# Patient Record
Sex: Male | Born: 1985 | Race: Black or African American | Hispanic: No | Marital: Married | Smoking: Never smoker
Health system: Southern US, Community
[De-identification: ages and names within clinical notes are randomized; demographics above are authoritative.]

## PROBLEM LIST (undated history)

## (undated) DIAGNOSIS — D72819 Decreased white blood cell count, unspecified: Secondary | ICD-10-CM

## (undated) DIAGNOSIS — K8020 Calculus of gallbladder without cholecystitis without obstruction: Secondary | ICD-10-CM

## (undated) DIAGNOSIS — E559 Vitamin D deficiency, unspecified: Secondary | ICD-10-CM

## (undated) DIAGNOSIS — I10 Essential (primary) hypertension: Secondary | ICD-10-CM

## (undated) DIAGNOSIS — D649 Anemia, unspecified: Secondary | ICD-10-CM

## (undated) DIAGNOSIS — R55 Syncope and collapse: Secondary | ICD-10-CM

## (undated) DIAGNOSIS — K219 Gastro-esophageal reflux disease without esophagitis: Secondary | ICD-10-CM

## (undated) DIAGNOSIS — K921 Melena: Secondary | ICD-10-CM

## (undated) DIAGNOSIS — K8681 Exocrine pancreatic insufficiency: Secondary | ICD-10-CM

## (undated) DIAGNOSIS — K4090 Unilateral inguinal hernia, without obstruction or gangrene, not specified as recurrent: Secondary | ICD-10-CM

## (undated) DIAGNOSIS — U071 COVID-19: Secondary | ICD-10-CM

## (undated) DIAGNOSIS — R197 Diarrhea, unspecified: Secondary | ICD-10-CM

## (undated) DIAGNOSIS — G4730 Sleep apnea, unspecified: Secondary | ICD-10-CM

## (undated) HISTORY — DX: Diarrhea, unspecified: R19.7

## (undated) HISTORY — DX: Unilateral inguinal hernia, without obstruction or gangrene, not specified as recurrent: K40.90

## (undated) HISTORY — DX: Exocrine pancreatic insufficiency: K86.81

## (undated) HISTORY — DX: Decreased white blood cell count, unspecified: D72.819

## (undated) HISTORY — DX: Vitamin D deficiency, unspecified: E55.9

## (undated) HISTORY — DX: COVID-19: U07.1

## (undated) HISTORY — DX: Melena: K92.1

## (undated) HISTORY — DX: Essential (primary) hypertension: I10

## (undated) HISTORY — DX: Anemia, unspecified: D64.9

## (undated) HISTORY — DX: Gastro-esophageal reflux disease without esophagitis: K21.9

---

## 2009-10-29 ENCOUNTER — Emergency Department: Payer: Self-pay | Admitting: Emergency Medicine

## 2011-12-27 ENCOUNTER — Ambulatory Visit: Payer: Self-pay

## 2012-06-08 ENCOUNTER — Ambulatory Visit: Payer: Self-pay | Admitting: General Practice

## 2013-03-28 ENCOUNTER — Ambulatory Visit: Payer: Self-pay | Admitting: General Practice

## 2013-03-29 ENCOUNTER — Ambulatory Visit: Payer: Self-pay | Admitting: Bariatrics

## 2013-03-29 DIAGNOSIS — Z01810 Encounter for preprocedural cardiovascular examination: Secondary | ICD-10-CM

## 2013-03-29 LAB — PHOSPHORUS: Phosphorus: 3.4 mg/dL (ref 2.5–4.9)

## 2013-03-29 LAB — COMPREHENSIVE METABOLIC PANEL
Anion Gap: 3 — ABNORMAL LOW (ref 7–16)
BUN: 13 mg/dL (ref 7–18)
Bilirubin,Total: 0.7 mg/dL (ref 0.2–1.0)
Calcium, Total: 9.7 mg/dL (ref 8.5–10.1)
Chloride: 106 mmol/L (ref 98–107)
EGFR (African American): >60
Glucose: 86 mg/dL (ref 65–99)
Sodium: 139 mmol/L (ref 136–145)

## 2013-03-29 LAB — CBC WITH DIFFERENTIAL/PLATELET
Basophil %: 1 %
Eosinophil #: 0.1 10*3/uL (ref 0.0–0.7)
Eosinophil %: 1.5 %
Lymphocyte %: 46.5 %
MCH: 28.4 pg (ref 26.0–34.0)
MCHC: 34.9 g/dL (ref 32.0–36.0)
MCV: 82 fL (ref 80–100)
Monocyte #: 0.3 x10 3/mm (ref 0.2–1.0)
Platelet: 211 10*3/uL (ref 150–440)
RDW: 13.1 % (ref 11.5–14.5)
WBC: 5.6 10*3/uL (ref 3.8–10.6)

## 2013-03-29 LAB — TSH: Thyroid Stimulating Horm: 1.56 u[IU]/mL

## 2013-03-29 LAB — IRON AND TIBC
Iron Bind.Cap.(Total): 344 ug/dL (ref 250–450)
Iron Saturation: 25 %
Iron: 85 ug/dL (ref 65–175)

## 2013-03-29 LAB — FERRITIN: Ferritin (ARMC): 206 ng/mL (ref 8–388)

## 2013-03-29 LAB — MAGNESIUM: Magnesium: 1.8 mg/dL

## 2013-03-29 LAB — LIPASE, BLOOD: Lipase: 173 U/L (ref 73–393)

## 2013-04-13 ENCOUNTER — Ambulatory Visit: Payer: Self-pay | Admitting: General Practice

## 2013-05-13 ENCOUNTER — Ambulatory Visit: Payer: Self-pay | Admitting: General Practice

## 2013-06-13 ENCOUNTER — Ambulatory Visit: Payer: Self-pay | Admitting: General Practice

## 2013-07-23 ENCOUNTER — Ambulatory Visit: Payer: Self-pay | Admitting: Bariatrics

## 2013-07-30 ENCOUNTER — Inpatient Hospital Stay: Payer: Self-pay | Admitting: Bariatrics

## 2013-07-31 HISTORY — PX: BARIATRIC SURGERY: SHX1103

## 2013-07-31 LAB — BASIC METABOLIC PANEL
Anion Gap: 4 — ABNORMAL LOW (ref 7–16)
BUN: 9 mg/dL (ref 7–18)
CO2: 28 mmol/L (ref 21–32)
Calcium, Total: 9.8 mg/dL (ref 8.5–10.1)
Chloride: 101 mmol/L (ref 98–107)
Creatinine: 1.04 mg/dL (ref 0.60–1.30)
GLUCOSE: 101 mg/dL — AB (ref 65–99)
Osmolality: 265 (ref 275–301)
Potassium: 4.3 mmol/L (ref 3.5–5.1)
SODIUM: 133 mmol/L — AB (ref 136–145)

## 2013-07-31 LAB — CBC WITH DIFFERENTIAL/PLATELET
Basophil #: 0 10*3/uL (ref 0.0–0.1)
Basophil %: 0.1 %
Eosinophil #: 0 10*3/uL (ref 0.0–0.7)
Eosinophil %: 0 %
HCT: 43.2 % (ref 40.0–52.0)
HGB: 14.5 g/dL (ref 13.0–18.0)
Lymphocyte #: 1.1 10*3/uL (ref 1.0–3.6)
Lymphocyte %: 7.8 %
MCH: 27.9 pg (ref 26.0–34.0)
MCHC: 33.6 g/dL (ref 32.0–36.0)
MCV: 83 fL (ref 80–100)
MONOS PCT: 5.9 %
Monocyte #: 0.8 x10 3/mm (ref 0.2–1.0)
NEUTROS PCT: 86.2 %
Neutrophil #: 12.4 10*3/uL — ABNORMAL HIGH (ref 1.4–6.5)
PLATELETS: 227 10*3/uL (ref 150–440)
RBC: 5.21 10*6/uL (ref 4.40–5.90)
RDW: 13.2 % (ref 11.5–14.5)
WBC: 14.4 10*3/uL — ABNORMAL HIGH (ref 3.8–10.6)

## 2013-08-01 LAB — CBC WITH DIFFERENTIAL/PLATELET
Basophil: 1 %
COMMENT - H1-COM1: NORMAL
Eosinophil: 1 %
HCT: 40.8 % (ref 40.0–52.0)
HGB: 14 g/dL (ref 13.0–18.0)
Lymphocytes: 27 %
MCH: 28.4 pg (ref 26.0–34.0)
MCHC: 34.2 g/dL (ref 32.0–36.0)
MCV: 83 fL (ref 80–100)
MONOS PCT: 7 %
Platelet: 214 10*3/uL (ref 150–440)
RBC: 4.92 10*6/uL (ref 4.40–5.90)
RDW: 13.3 % (ref 11.5–14.5)
SEGMENTED NEUTROPHILS: 64 %
WBC: 9.8 10*3/uL (ref 3.8–10.6)

## 2013-08-01 LAB — PATHOLOGY REPORT

## 2013-08-19 ENCOUNTER — Ambulatory Visit: Payer: Self-pay | Admitting: Bariatrics

## 2013-09-11 ENCOUNTER — Ambulatory Visit: Payer: Self-pay | Admitting: Bariatrics

## 2013-10-18 ENCOUNTER — Other Ambulatory Visit: Payer: Self-pay | Admitting: Bariatrics

## 2014-01-30 ENCOUNTER — Other Ambulatory Visit: Payer: Self-pay | Admitting: Bariatrics

## 2014-01-30 LAB — CBC WITH DIFFERENTIAL/PLATELET
Basophil #: 0 10*3/uL (ref 0.0–0.1)
Basophil %: 1.2 %
EOS ABS: 0 10*3/uL (ref 0.0–0.7)
Eosinophil %: 1.2 %
HCT: 43.7 % (ref 40.0–52.0)
HGB: 13.7 g/dL (ref 13.0–18.0)
Lymphocyte #: 1.4 10*3/uL (ref 1.0–3.6)
Lymphocyte %: 49.8 %
MCH: 26.7 pg (ref 26.0–34.0)
MCHC: 31.3 g/dL — ABNORMAL LOW (ref 32.0–36.0)
MCV: 85 fL (ref 80–100)
MONOS PCT: 5.4 %
Monocyte #: 0.1 x10 3/mm — ABNORMAL LOW (ref 0.2–1.0)
Neutrophil #: 1.2 10*3/uL — ABNORMAL LOW (ref 1.4–6.5)
Neutrophil %: 42.4 %
Platelet: 171 10*3/uL (ref 150–440)
RBC: 5.11 10*6/uL (ref 4.40–5.90)
RDW: 14.6 % — ABNORMAL HIGH (ref 11.5–14.5)
WBC: 2.8 10*3/uL — ABNORMAL LOW (ref 3.8–10.6)

## 2014-01-30 LAB — PHOSPHORUS: Phosphorus: 2.9 mg/dL (ref 2.5–4.9)

## 2014-01-30 LAB — HEMOGLOBIN A1C: Hemoglobin A1C: 4.8 % (ref 4.2–6.3)

## 2014-01-30 LAB — COMPREHENSIVE METABOLIC PANEL
ALT: 26 U/L
Albumin: 3.4 g/dL (ref 3.4–5.0)
Alkaline Phosphatase: 82 U/L
Anion Gap: 7 (ref 7–16)
BUN: 10 mg/dL (ref 7–18)
Bilirubin,Total: 0.8 mg/dL (ref 0.2–1.0)
CALCIUM: 9 mg/dL (ref 8.5–10.1)
CHLORIDE: 106 mmol/L (ref 98–107)
CREATININE: 1.03 mg/dL (ref 0.60–1.30)
Co2: 27 mmol/L (ref 21–32)
EGFR (African American): >60
Glucose: 74 mg/dL (ref 65–99)
Osmolality: 277 (ref 275–301)
Potassium: 4.1 mmol/L (ref 3.5–5.1)
SGOT(AST): 27 U/L (ref 15–37)
Sodium: 140 mmol/L (ref 136–145)
Total Protein: 7.2 g/dL (ref 6.4–8.2)

## 2014-01-30 LAB — FOLATE: Folic Acid: 2.6 ng/mL — ABNORMAL LOW (ref 3.1–100.0)

## 2014-01-30 LAB — FERRITIN: FERRITIN (ARMC): 320 ng/mL (ref 8–388)

## 2014-01-30 LAB — IRON: Iron: 80 ug/dL (ref 65–175)

## 2014-01-30 LAB — MAGNESIUM: Magnesium: 2.1 mg/dL

## 2014-08-07 ENCOUNTER — Other Ambulatory Visit: Payer: Self-pay | Admitting: Surgical

## 2014-10-04 NOTE — Discharge Summary (Signed)
Dates of Admission and Diagnosis:  Date of Admission 30-Jul-2013   Date of Discharge 02-Aug-2013   Admitting Diagnosis morbid obesity   Final Diagnosis morbid obesity    Chief Complaint/History of Present Illness long standing morbid obesity with associated sleep apnea. failed multiple diets.   Allergies:  No Known Allergies:   PERTINENT RADIOLOGY STUDIES: XRay:    18-Feb-15 08:47, Upper GI  Upper GI   REASON FOR EXAM:    post procedure sleeve gastrectomy with duodenal/ileal   anastomosis. Rule out leak  COMMENTS:       PROCEDURE: FL  - FL UPPER GI  - Jul 31 2013  8:47AM     CLINICAL DATA:  Post gastric sleeve, assess for leakage    EXAM:  WATER SOLUBLE UPPER GI SERIES    TECHNIQUE:  Single-column upper GI series was performed using water soluble  contrast.  CONTRAST:  Gastroview orally    COMPARISON:  None.    FLUOROSCOPY TIME:  42 seconds    FINDINGS:  The thoracic esophagus distended well. The remnant gastric lumen  distended adequately. There was prompt drainage into the duodenum.  There was no evidence of leak. The patient voiced no discomfort.     IMPRESSION:  There is no evidence of an anastomotic leak status post gastric  sleeve procedure.  Electronically Signed    By: David  Martinique    On: 07/31/2013 08:52         Verified By: DAVID A. Martinique, M.D., MD  CT:    19-Feb-15 12:43, CT Abdomen With Contrast  CT Abdomen With Contrast   REASON FOR EXAM:    abdominal pain s/p sleeve gastrectomy with   duodenal/ileal anastomosis;    NOTE:  COMMENTS:       PROCEDURE: CT  - CT ABDOMEN STANDARD W  - Aug 01 2013 12:43PM     CLINICAL DATA:  Severe epigastric pain. Status post gastrectomy on  07/30/2013.    EXAM:  CT ABDOMEN WITH CONTRAST    TECHNIQUE:  Multidetector CT imaging of the abdomen was performed using the  standard protocol following bolus administration of intravenous  contrast.    CONTRAST:  125 mm of Isovue 370.    COMPARISON:  No  priors.    FINDINGS:  Lung Bases: Dependent subsegmental atelectasis in the lower lobes of  the lungs bilaterally. Trace left pleural effusion layering  dependently is simple in appearance.    Abdomen: Postoperative changes of sleeve gastrectomy and  duodenoileal bypass (SIPS procedure) are noted. There is some  haziness in the fat adjacent to the head of the pancreas and the  resected portion of the duodenum. There is a suture line in the  second portion of the duodenum, and the duodenalbulb and proximal  second portion of the duodenum have been resected. Body and tail of  the pancreas are normal in appearance. No pancreatic ductal  dilatation. No intra or extrahepatic biliary ductal dilatation. No  focal fluid collections are notedto suggest postoperative seroma,  hematoma or abscess at this time. There is a surgical drain in place  entering the left upper quadrant of the abdomen extending across the  midline and descending in the anterior aspect of the peritoneal  cavity. Theappearance of the liver, gallbladder, spleen, bilateral  adrenal glands and bilateral kidneys is unremarkable. Within the  visualized peritoneal cavity there is no significant volume of  ascites, no pneumoperitoneum and no pathologic distention of small  bowel.    Musculoskeletal: Several tiny locules  of gas are noted in the  anterior abdominal wall subcutaneous fat and musculature, iatrogenic  from recent surgery. Healing laparoscopy port sites are noted in the  epigastric region as well as a right and left sides of the abdomen.  There are no aggressive appearing lytic or blastic lesions noted in  the visualized portions of the skeleton.     IMPRESSION:  1. Postoperative changes of SIPS procedure are noted, with slight  haziness in the fat adjacent to the stomach, resected duodenum and  pancreatic head. This is likely resolving postoperative  inflammation/edema, however, given the findings adjacent to  the  pancreatic head, correlation with lipase levels is recommended to  exclude the possibility of pancreatitis.  2. No unexpected postoperative fluid collections to suggest  significant seroma, hematoma or abscess formation at this time.  3. Small amount of dependent subsegmental atelectasis in the lower  lobes of the lungs bilaterallywith trace left pleural effusion.  4. Additional incidental findings, as above.      Electronically Signed    By: Vinnie Langton M.D.    On: 08/01/2013 14:19         Verified By: Etheleen Mayhew, M.D.,   Pertinent Past History:  Pertinent Past History long standing obesity with recently diagnosed sleep apnea.   Hospital Course:  Hospital Course patient underwent sleeve gastrectomy with duodenal switch on day of admission. He tolerated well but had some prolonged incisional and abdominal pain. His initial postoperative contrast study showed no leak at anastomotic site. This was confirmed on followup ct scan. His pain improved by his 3rd postoperative day and he was d/c to home.   Condition on Discharge Stable   Code Status:  Code Status Full Code   DISCHARGE INSTRUCTIONS HOME MEDS:  Medication Reconciliation: Patient's Home Medications at Discharge:     Medication Instructions  vitamin d gummy  1 tab(s) orally once a day   acetaminophen-hydrocodone  10 milliliter(s) orally every 4 hours, As needed, pain    PRESCRIPTIONS: PRINTED AND GIVEN TO PATIENT/FAMILY   Physician's Instructions:  Home Health? No   Treatments None   Dressing Care Replace dressing as necessary.  May shower.   Home Oxygen? No   Diet bariatric liquids as per protocol   Activity Limitations No heavy lifting   Return to Work 2 weeks   Time frame for Follow Up Appointment 1-2 weeks   Electronic Signatures: Ladora Daniel (MD)  (Signed 08-Apr-15 11:24)  Authored: ADMISSION DATE AND DIAGNOSIS, CHIEF COMPLAINT/HPI, Allergies, PERTINENT RADIOLOGY STUDIES,  PERTINENT PAST HISTORY, HOSPITAL COURSE, DISCHARGE INSTRUCTIONS HOME MEDS, PATIENT INSTRUCTIONS   Last Updated: 08-Apr-15 11:24 by Ladora Daniel (MD)

## 2014-10-04 NOTE — Op Note (Signed)
PATIENT NAME:  Thomas Shaw, Thomas Shaw MR#:  [id number] DATE OF BIRTH:  04/03/1986  DATE OF PROCEDURE:  07/30/2013  PROCEDURE PERFORMED: Laparoscopic sleeve gastrectomy with a duodenal switch,  duodenal ileal anastomosis, distal stomach stricturoplasty with intraoperative endoscopy.  PREOPERATIVE DIAGNOSIS: Long-standing morbid obesity with multiple attempts at dieting.  POSTOPERATIVE DIAGNOSIS: Long-standing morbid obesity with multiple attempts at dieting with narrowing of the distal aspect of the sleeve, prompting a stricturoplasty to prevent potential for outlet obstruction of the stomach.   PROCEDURE: The patient was brought to the Operating Room and placed in supine position. General anesthesia was obtained with orotracheal intubation. A Foley catheter inserted sterilely. TED hose and Thromboguards were applied and a foot board applied at the end of the operative bed. The abdomen and chest were sterilely prepped and draped. A 5 mm Optiview trocar introduced under direct visualization in the left upper quadrant of the abdomen. Four additional trocars were introduced across the upper abdomen under direct visualization.   The terminal ileum was identified with the ileocecal valve. The bowel was then followed proximally 300 cm, at which point the ileum was secured to the gastrocolic ligament. The patient then had a Nathanson liver retractor introduced through a subxiphoid defect, and left lobe of the liver was elevated. There was no overt evidence of a hiatal hernia appreciated. The patient had mobilization of the fat pad in the region of the upper stomach, adjacent to the angle of Hiss. The fat pad being mobilized from the undersurface of the left hemidiaphragm by use of the Harmonic scalpel, the upper fundus also freed from the undersurface of the left hemidiaphragm. No evidence of a hiatal hernia appreciated.   Attention was directed at this time to the distal stomach, where arcade vessels were divided  along the distal greater curvature of the stomach. This included mobilization of the peritoneal attachments of the distal stomach to the underlying pancreas. At this point, a 36 [place] ViSiGi device was deployed in the region of the antrum, and a series of GI staplers was used to create a medially based gastric tube effect. The first two firings were placed in a relative transverse direction in an effort to avoid narrowing in the region of the incisura. This was then followed by a vertical line of staples, brought out just lateral to the angle of Hiss, leaving a small dog ear of stomach proximally.   Prior to the last firing of GI stapler, the residual vascular pedicles were divided along the greater curvature of the stomach, completing separation of the gastrosplenic and gastrocolic ligament. Next, the vascular pedicles associated with the pylorus and proximal duodenum were taken down by use of a Harmonic scalpel, initially along the distal lesser curvature of the stomach and the inner curve of the duodenum. Next, portion of the peritoneal attachments of the lateral aspect of the proximal duodenum divided by use of the Harmonic scalpel. Blunt dissection was used to create a clear window across the posterior aspect of the duodenum at a point approximately 2.5 cm inferior to the pylorus.   A blue load GIA stapler was used to transect the duodenum at this point. Residual small vascular pedicles along the lateral aspect of the proximal duodenum divided, freeing the pylorus, allowing it to be brought more toward the midline. At this point, a duodenal ileal anastomosis created. This was accomplished with a seromuscular, running 2-0 Polysorb suture along the distal aspect of the duodenal staple line. This was then followed by an enterotomy on the antimesenteric  border of the ileum and opposing portion of the anterior aspect of the duodenum. A full-thickness, running 3-0 Polysorb suture was used to create full  circumferential anastomotic closure. This was then reinforced with an additional running 2-0 Polysorb suture.   The patient then had occlusion of the efferent and afferent limbs of the ileum and insufflation of the gastric tube effect performed with the ViSiGi device. There was no evidence of air leak noted in the area of the anastomosis. On inspection of the gastric tube effect, there was questionable mild narrowing leading into the region of the incisura from the antrum. The ViSiGi device was withdrawn, and intraoperative endoscopy performed. While there was significant narrowing, there was mild a relative decrease in the lumen effect immediately distal to the incisura. It was felt that, over the long term, this may represent a focus of decreased clearance of the gastric tube, and it was felt that a dilation of this would be an appropriate preventative for dilation of this area in the future.   With this in mind, an enterotomy was made in the anterior aspect of the sleeve, immediately inferior to the incisura. This enterotomy was made over a distance of approximately 2.5 cm. The patient then had closure of the defect in a transverse direction, using a running 2-0 Surgidac suture. At the closure, the endoscope was then used for insufflation, and a saline bath performed in this area. No air leak identified. With the endoscope, there was noted to be increased patency of this portion of the gastric sleeve. At this time, no additional intervention was felt to be necessary.   JP drain was introduced through a left lateral trocar space. The wound was treated with 4-0 Monocryl to the dermis, followed by staples superiorly. Sterile dressings applied, and the drain  secured with a sterile dressing. The patient at this time was allowed to recover from anesthesia, having tolerated the procedure well. There was minimal blood loss.    ____________________________ Venia Carbon Duke Salvia, MD mat:cg D: 07/31/2013 00:04:08  ET T: 07/31/2013 00:43:24 ET JOB#: [id number]  cc: Legrand Como A. Duke Salvia, MD, <Dictator> Ladora Daniel MD ELECTRONICALLY SIGNED 08/01/2013 6:21

## 2014-10-08 ENCOUNTER — Other Ambulatory Visit: Payer: Self-pay | Admitting: Gastroenterology

## 2014-10-09 ENCOUNTER — Other Ambulatory Visit: Payer: Self-pay | Admitting: Gastroenterology

## 2014-10-09 DIAGNOSIS — R1084 Generalized abdominal pain: Secondary | ICD-10-CM

## 2014-10-13 ENCOUNTER — Ambulatory Visit
Admission: RE | Admit: 2014-10-13 | Discharge: 2014-10-13 | Disposition: A | Discharge status: Home / Self Care | Payer: 59 | Source: Ambulatory Visit | Attending: Gastroenterology | Admitting: Gastroenterology

## 2014-10-13 ENCOUNTER — Other Ambulatory Visit: Payer: Self-pay | Admitting: *Deleted

## 2014-10-13 DIAGNOSIS — R1084 Generalized abdominal pain: Secondary | ICD-10-CM | POA: Diagnosis present

## 2014-10-13 DIAGNOSIS — R188 Other ascites: Secondary | ICD-10-CM | POA: Diagnosis not present

## 2014-10-13 DIAGNOSIS — K828 Other specified diseases of gallbladder: Secondary | ICD-10-CM | POA: Diagnosis not present

## 2014-10-13 DIAGNOSIS — R52 Pain, unspecified: Secondary | ICD-10-CM

## 2014-10-15 DIAGNOSIS — K8681 Exocrine pancreatic insufficiency: Secondary | ICD-10-CM

## 2014-10-15 HISTORY — DX: Exocrine pancreatic insufficiency: K86.81

## 2015-03-02 ENCOUNTER — Telehealth: Payer: Self-pay | Admitting: Gastroenterology

## 2015-03-02 NOTE — Telephone Encounter (Signed)
Left voice message for patient to call and schedule for abdominal distension (gaseous) with Dr. Allen Norris

## 2015-04-15 ENCOUNTER — Other Ambulatory Visit: Payer: Self-pay

## 2015-04-15 DIAGNOSIS — R1013 Epigastric pain: Secondary | ICD-10-CM | POA: Insufficient documentation

## 2015-04-16 ENCOUNTER — Ambulatory Visit: Payer: Self-pay | Admitting: Gastroenterology

## 2015-06-25 DIAGNOSIS — K8681 Exocrine pancreatic insufficiency: Secondary | ICD-10-CM | POA: Diagnosis not present

## 2015-12-08 ENCOUNTER — Emergency Department
Admission: EM | Admit: 2015-12-08 | Discharge: 2015-12-08 | Disposition: A | Discharge status: Home / Self Care | Payer: 59 | Attending: Emergency Medicine | Admitting: Emergency Medicine

## 2015-12-08 DIAGNOSIS — Z91013 Allergy to seafood: Secondary | ICD-10-CM | POA: Insufficient documentation

## 2015-12-08 DIAGNOSIS — T7840XA Allergy, unspecified, initial encounter: Secondary | ICD-10-CM | POA: Insufficient documentation

## 2015-12-08 DIAGNOSIS — Z79899 Other long term (current) drug therapy: Secondary | ICD-10-CM | POA: Diagnosis not present

## 2015-12-08 DIAGNOSIS — L539 Erythematous condition, unspecified: Secondary | ICD-10-CM | POA: Diagnosis present

## 2015-12-08 DIAGNOSIS — T781XXA Other adverse food reactions, not elsewhere classified, initial encounter: Secondary | ICD-10-CM | POA: Diagnosis not present

## 2015-12-08 MED ORDER — EPINEPHRINE 0.3 MG/0.3ML IJ SOAJ: 0.3000 mg | Freq: Once | INTRAMUSCULAR | Status: AC

## 2015-12-08 MED ORDER — DIPHENHYDRAMINE HCL 50 MG/ML IJ SOLN
50.0000 mg | Freq: Once | INTRAMUSCULAR | Status: AC
  Administered 2015-12-08: 50 mg via INTRAVENOUS
  Filled 2015-12-08: qty 1

## 2015-12-08 MED ORDER — FAMOTIDINE IN NACL 20-0.9 MG/50ML-% IV SOLN
20.0000 mg | Freq: Once | INTRAVENOUS | Status: AC
  Administered 2015-12-08: 20 mg via INTRAVENOUS
  Filled 2015-12-08: qty 50

## 2015-12-08 MED ORDER — PREDNISONE 20 MG PO TABS: 40.0000 mg | ORAL_TABLET | Freq: Every day | ORAL | Status: DC

## 2015-12-08 MED ORDER — DIPHENHYDRAMINE HCL 25 MG PO CAPS: 50.0000 mg | ORAL_CAPSULE | Freq: Four times a day (QID) | ORAL | Status: DC | PRN

## 2015-12-08 MED ORDER — METHYLPREDNISOLONE SODIUM SUCC 125 MG IJ SOLR
125.0000 mg | Freq: Once | INTRAMUSCULAR | Status: AC
Start: 2015-12-08 — End: 2015-12-08
  Administered 2015-12-08: 125 mg via INTRAVENOUS
  Filled 2015-12-08: qty 2

## 2015-12-08 MED ORDER — SODIUM CHLORIDE 0.9 % IV BOLUS (SEPSIS)
1000.0000 mL | Freq: Once | INTRAVENOUS | Status: AC
  Administered 2015-12-08: 1000 mL via INTRAVENOUS

## 2015-12-08 NOTE — ED Provider Notes (Signed)
[facility] Emergency Department Provider Note  ____________________________________________  Time seen: 4:30 AM  I have reviewed the triage vital signs and the nursing notes.   HISTORY  Chief Complaint Allergic Reaction    HPI Thomas Shaw is a 30 y.o. male who woke up a few hours ago noticing diffuse itching all over his body with an emerging red rash. Also complains of some swelling of his lower lip. No throat swelling or tongue swelling. No shortness of breath. Unsure of the cause. Only known allergy to anything history. He ate some fish last night it was prepared at home, he reports it was tilapia.  No chest pain or shortness of breath. No wheezing. No vomiting or dizziness or syncope.     No past medical history on file.   Patient Active Problem List   Diagnosis Date Noted  . Abdominal pain, epigastric 04/15/2015     No past surgical history on file. Gastric bypass  Current Outpatient Rx  Name  Route  Sig  Dispense  Refill  . Cholecalciferol (D 1000) 1000 UNITS CHEW   Oral   Chew 1 tablet by mouth daily.          Marland Kitchen CREON 36000 units CPEP capsule   Oral   Take 4 capsules by mouth 4 (four) times daily.      11     Dispense as written.   . Multiple Vitamin (MULTI-VITAMINS) TABS   Oral   Take 1 tablet by mouth daily.          . simethicone (MYLICON) 80 MG chewable tablet   Oral   Chew 160 mg by mouth as needed.          . diphenhydrAMINE (BENADRYL) 25 mg capsule   Oral   Take 2 capsules (50 mg total) by mouth every 6 (six) hours as needed.   60 capsule   0   . EPINEPHrine 0.3 mg/0.3 mL IJ SOAJ injection   Intramuscular   Inject 0.3 mLs (0.3 mg total) into the muscle once. Follow package instructions as needed for severe allergy or anaphylactic reaction.   1 Device   2   . predniSONE (DELTASONE) 20 MG tablet   Oral   Take 2 tablets (40 mg total) by mouth daily.   8 tablet   0       Allergies Shellfish-derived products Shrimp  No family history on file.  Social History Social History  Substance Use Topics  . Smoking status: Not on file  . Smokeless tobacco: Not on file  . Alcohol Use: Not on file  No tobacco or alcohol use  Review of Systems  Constitutional:   No fever or chills.  ENT:   No sore throat. No rhinorrhea. Cardiovascular:   No chest pain. Respiratory:   No dyspnea or cough. Gastrointestinal:   Negative for abdominal pain, vomiting and diarrhea.  10-point ROS otherwise negative.  ____________________________________________   PHYSICAL EXAM:  VITAL SIGNS: ED Triage Vitals  Enc Vitals Group     BP 12/08/15 0419 135/85 mmHg     Pulse Rate 12/08/15 0419 75     Resp 12/08/15 0419 18     Temp 12/08/15 0419 97.9 F (36.6 C)     Temp Source 12/08/15 0419 Oral     SpO2 12/08/15 0419 100 %     Weight 12/08/15 0419 180 lb (81.647 kg)     Height 12/08/15 0419 5\' 11"  (1.803 m)     Head Cir --  Peak Flow --      Pain Score --      Pain Loc --      Pain Edu? --      Excl. in [place]? --     Vital signs reviewed, nursing assessments reviewed.   Constitutional:   Alert and oriented. Well appearing and in no distress. Eyes:   No scleral icterus. No conjunctival pallor. PERRL. EOMI.  No nystagmus. ENT   Head:   Normocephalic and atraumatic.Swelling of the lower lip with soft watery edema.    Nose:   No congestion/rhinnorhea. No septal hematoma   Mouth/Throat:   MMM, no pharyngeal erythema. No peritonsillar mass. No tongue elevation or edema of the floor of mouth.   Neck:   No stridor. No SubQ emphysema. No meningismus. Hematological/Lymphatic/Immunilogical:   No cervical lymphadenopathy. Cardiovascular:   RRR. Symmetric bilateral radial and DP pulses.  No murmurs.  Respiratory:   Normal respiratory effort without tachypnea nor retractions. Breath sounds are clear and equal bilaterally. No  wheezes/rales/rhonchi. Gastrointestinal:   Soft and nontender. Non distended. There is no CVA tenderness.  No rebound, rigidity, or guarding. Genitourinary:   deferred Musculoskeletal:   Nontender with normal range of motion in all extremities. No joint effusions.  No lower extremity tenderness.  No edema. Neurologic:   Normal speech and language.  CN 2-10 normal. Motor grossly intact. No gross focal neurologic deficits are appreciated.  Skin:    Confluent erythematous rash on extremities and the back and the neck. Most prominent in flexor areas. Not classic for urticaria. No patterned rash, no petechia purpura bullae.  ____________________________________________    LABS (pertinent positives/negatives) (all labs ordered are listed, but only abnormal results are displayed) Labs Reviewed - No data to display ____________________________________________   EKG    ____________________________________________    RADIOLOGY    ____________________________________________   PROCEDURES   ____________________________________________   INITIAL IMPRESSION / ASSESSMENT AND PLAN / ED COURSE  Pertinent labs & imaging results that were available during my care of the patient were reviewed by me and considered in my medical decision making (see chart for details).  Patient presents with skin rash and lip swelling consistent with allergic reaction. Since the only thing he knows he is allergic to out of everything is shrimp, I think it is most likely that the fish he ate last night was contaminated with shrimp from the store. A low dose exposure may have caused a delayed reaction like this. No anaphylaxis. Not consistent with Stevens-Johnson's or TEN.  I highly doubt drug rash or dress. No evidence of infection. Patient given antihistamines and steroids and at recheck at 7:00 AM he feels much better. Rash has resolved. I'll continue steroids and antihistamine at home, prescribed EpiPen as a  precautionary measure. Follow-up with primary care.     ____________________________________________   FINAL CLINICAL IMPRESSION(S) / ED DIAGNOSES  Final diagnoses:  Allergic reaction, initial encounter       Portions of this note were generated with dragon dictation software. Dictation errors may occur despite best attempts at proofreading.   Carrie Mew, MD 12/08/15 [phone]

## 2015-12-08 NOTE — ED Notes (Signed)
Pt in with co itching all over and lower lip swelling, pt unsure of cause.

## 2015-12-08 NOTE — Discharge Instructions (Signed)

## 2016-02-12 DIAGNOSIS — Z0000 Encounter for general adult medical examination without abnormal findings: Secondary | ICD-10-CM | POA: Diagnosis not present

## 2016-02-12 DIAGNOSIS — T783XXS Angioneurotic edema, sequela: Secondary | ICD-10-CM | POA: Diagnosis not present

## 2016-02-22 DIAGNOSIS — D649 Anemia, unspecified: Secondary | ICD-10-CM

## 2016-02-22 DIAGNOSIS — R202 Paresthesia of skin: Secondary | ICD-10-CM | POA: Insufficient documentation

## 2016-02-22 HISTORY — DX: Anemia, unspecified: D64.9

## 2016-02-25 DIAGNOSIS — K8681 Exocrine pancreatic insufficiency: Secondary | ICD-10-CM | POA: Diagnosis not present

## 2016-02-25 DIAGNOSIS — D649 Anemia, unspecified: Secondary | ICD-10-CM | POA: Diagnosis not present

## 2016-02-25 DIAGNOSIS — R202 Paresthesia of skin: Secondary | ICD-10-CM | POA: Diagnosis not present

## 2016-03-11 DIAGNOSIS — K8681 Exocrine pancreatic insufficiency: Secondary | ICD-10-CM | POA: Diagnosis not present

## 2016-03-11 DIAGNOSIS — R202 Paresthesia of skin: Secondary | ICD-10-CM | POA: Diagnosis not present

## 2016-03-11 DIAGNOSIS — D649 Anemia, unspecified: Secondary | ICD-10-CM | POA: Diagnosis not present

## 2016-04-14 DIAGNOSIS — T783XXA Angioneurotic edema, initial encounter: Secondary | ICD-10-CM | POA: Diagnosis not present

## 2016-04-14 DIAGNOSIS — J3089 Other allergic rhinitis: Secondary | ICD-10-CM | POA: Diagnosis not present

## 2016-04-14 DIAGNOSIS — T781XXA Other adverse food reactions, not elsewhere classified, initial encounter: Secondary | ICD-10-CM | POA: Diagnosis not present

## 2016-04-14 DIAGNOSIS — J309 Allergic rhinitis, unspecified: Secondary | ICD-10-CM | POA: Diagnosis not present

## 2016-07-08 ENCOUNTER — Ambulatory Visit: Payer: Self-pay | Admitting: Physician Assistant

## 2016-07-08 ENCOUNTER — Encounter: Payer: Self-pay | Admitting: Physician Assistant

## 2016-07-08 VITALS — BP 110/80 | HR 90 | Temp 98.8°F

## 2016-07-08 DIAGNOSIS — A084 Viral intestinal infection, unspecified: Secondary | ICD-10-CM

## 2016-07-08 NOTE — Progress Notes (Signed)
S:  Pt c/o loose stools and sore throat, sx for 1 day, no fever/chills, cough or congestion, no abd pain except for cramping with diarrhea; denies cp/sob, denies camping, bad food, recent antibiotics, or exposure to bad water Remainder ros neg  O:  Vitals wnl, nad, ENT wnl, neck supple no lymph, lungs c t a, cv rrr, abd soft nontender bs increased lower quads b/l, neuro intact  A:  Viral gastroenteritis  P:  Reassurance, fluids, brat diet, immodium ad for diarrhea if needed,  return if not better in 3 days, return earlier if worsening

## 2016-11-16 DIAGNOSIS — K8681 Exocrine pancreatic insufficiency: Secondary | ICD-10-CM | POA: Diagnosis not present

## 2016-12-30 ENCOUNTER — Ambulatory Visit: Payer: Self-pay | Admitting: Registered Nurse

## 2016-12-30 VITALS — BP 118/70 | HR 73 | Temp 98.5°F

## 2016-12-30 DIAGNOSIS — I10 Essential (primary) hypertension: Secondary | ICD-10-CM

## 2016-12-30 DIAGNOSIS — E559 Vitamin D deficiency, unspecified: Secondary | ICD-10-CM

## 2016-12-30 DIAGNOSIS — K219 Gastro-esophageal reflux disease without esophagitis: Secondary | ICD-10-CM

## 2016-12-30 DIAGNOSIS — J019 Acute sinusitis, unspecified: Secondary | ICD-10-CM

## 2016-12-30 DIAGNOSIS — G4733 Obstructive sleep apnea (adult) (pediatric): Secondary | ICD-10-CM | POA: Insufficient documentation

## 2016-12-30 HISTORY — DX: Essential (primary) hypertension: I10

## 2016-12-30 HISTORY — DX: Gastro-esophageal reflux disease without esophagitis: K21.9

## 2016-12-30 HISTORY — DX: Vitamin D deficiency, unspecified: E55.9

## 2016-12-30 MED ORDER — FLUTICASONE PROPIONATE 50 MCG/ACT NA SUSP: 1.0000 | Freq: Two times a day (BID) | NASAL | 1 refills | Status: DC | PRN

## 2016-12-30 MED ORDER — CETIRIZINE HCL 10 MG PO TABS: 10.0000 mg | ORAL_TABLET | Freq: Every day | ORAL | 0 refills | Status: DC

## 2016-12-30 MED ORDER — SALINE SPRAY 0.65 % NA SOLN: 2.0000 | NASAL | 0 refills | Status: DC

## 2016-12-30 MED ORDER — AMOXICILLIN-POT CLAVULANATE 875-125 MG PO TABS: 1.0000 | ORAL_TABLET | Freq: Two times a day (BID) | ORAL | 0 refills | Status: DC

## 2016-12-30 NOTE — Patient Instructions (Signed)
Sinusitis, Adult Sinusitis is soreness and inflammation of your sinuses. Sinuses are hollow spaces in the bones around your face. Your sinuses are located:  Around your eyes.  In the middle of your forehead.  Behind your nose.  In your cheekbones.  Your sinuses and nasal passages are lined with a stringy fluid (mucus). Mucus normally drains out of your sinuses. When your nasal tissues become inflamed or swollen, the mucus can become trapped or blocked so air cannot flow through your sinuses. This allows bacteria, viruses, and funguses to grow, which leads to infection. Sinusitis can develop quickly and last for 7?10 days (acute) or for more than 12 weeks (chronic). Sinusitis often develops after a cold. What are the causes? This condition is caused by anything that creates swelling in the sinuses or stops mucus from draining, including:  Allergies.  Asthma.  Bacterial or viral infection.  Abnormally shaped bones between the nasal passages.  Nasal growths that contain mucus (nasal polyps).  Narrow sinus openings.  Pollutants, such as chemicals or irritants in the air.  A foreign object stuck in the nose.  A fungal infection. This is rare.  What increases the risk? The following factors may make you more likely to develop this condition:  Having allergies or asthma.  Having had a recent cold or respiratory tract infection.  Having structural deformities or blockages in your nose or sinuses.  Having a weak immune system.  Doing a lot of swimming or diving.  Overusing nasal sprays.  Smoking.  What are the signs or symptoms? The main symptoms of this condition are pain and a feeling of pressure around the affected sinuses. Other symptoms include:  Upper toothache.  Earache.  Headache.  Bad breath.  Decreased sense of smell and taste.  A cough that may get worse at night.  Fatigue.  Fever.  Thick drainage from your nose. The drainage is often green and  it may contain pus (purulent).  Stuffy nose or congestion.  Postnasal drip. This is when extra mucus collects in the throat or back of the nose.  Swelling and warmth over the affected sinuses.  Sore throat.  Sensitivity to light.  How is this diagnosed? This condition is diagnosed based on symptoms, a medical history, and a physical exam. To find out if your condition is acute or chronic, your health care provider may:  Look in your nose for signs of nasal polyps.  Tap over the affected sinus to check for signs of infection.  View the inside of your sinuses using an imaging device that has a light attached (endoscope).  If your health care provider suspects that you have chronic sinusitis, you may also:  Be tested for allergies.  Have a sample of mucus taken from your nose (nasal culture) and checked for bacteria.  Have a mucus sample examined to see if your sinusitis is related to an allergy.  If your sinusitis does not respond to treatment and it lasts longer than 8 weeks, you may have an MRI or CT scan to check your sinuses. These scans also help to determine how severe your infection is. In rare cases, a bone biopsy may be done to rule out more serious types of fungal sinus disease. How is this treated? Treatment for sinusitis depends on the cause and whether your condition is chronic or acute. If a virus is causing your sinusitis, your symptoms will go away on their own within 10 days. You may be given medicines to relieve your symptoms,   including:  Topical nasal decongestants. They shrink swollen nasal passages and let mucus drain from your sinuses.  Antihistamines. These drugs block inflammation that is triggered by allergies. This can help to ease swelling in your nose and sinuses.  Topical nasal corticosteroids. These are nasal sprays that ease inflammation and swelling in your nose and sinuses.  Nasal saline washes. These rinses can help to get rid of thick mucus in  your nose.  If your condition is caused by bacteria, you will be given an antibiotic medicine. If your condition is caused by a fungus, you will be given an antifungal medicine. Surgery may be needed to correct underlying conditions, such as narrow nasal passages. Surgery may also be needed to remove polyps. Follow these instructions at home: Medicines  Take, use, or apply over-the-counter and prescription medicines only as told by your health care provider. These may include nasal sprays.  If you were prescribed an antibiotic medicine, take it as told by your health care provider. Do not stop taking the antibiotic even if you start to feel better. Hydrate and Humidify  Drink enough water to keep your urine clear or pale yellow. Staying hydrated will help to thin your mucus.  Use a cool mist humidifier to keep the humidity level in your home above 50%.  Inhale steam for 10-15 minutes, 3-4 times a day or as told by your health care provider. You can do this in the bathroom while a hot shower is running.  Limit your exposure to cool or dry air. Rest  Rest as much as possible.  Sleep with your head raised (elevated).  Make sure to get enough sleep each night. General instructions  Apply a warm, moist washcloth to your face 3-4 times a day or as told by your health care provider. This will help with discomfort.  Wash your hands often with soap and water to reduce your exposure to viruses and other germs. If soap and water are not available, use hand sanitizer.  Do not smoke. Avoid being around people who are smoking (secondhand smoke).  Keep all follow-up visits as told by your health care provider. This is important. Contact a health care provider if:  You have a fever.  Your symptoms get worse.  Your symptoms do not improve within 10 days. Get help right away if:  You have a severe headache.  You have persistent vomiting.  You have pain or swelling around your face or  eyes.  You have vision problems.  You develop confusion.  Your neck is stiff.  You have trouble breathing. This information is not intended to replace advice given to you by your health care provider. Make sure you discuss any questions you have with your health care provider. Document Released: 05/30/2005 Document Revised: 01/24/2016 Document Reviewed: 03/25/2015 Elsevier Interactive Patient Education  2017 [employer] Sinus Rinse What is a sinus rinse? A sinus rinse is a simple home treatment that is used to rinse your sinuses with a sterile mixture of salt and water (saline solution). Sinuses are air-filled spaces in your skull behind the bones of your face and forehead that open into your nasal cavity. You will use the following:  Saline solution.  Neti pot or spray bottle. This releases the saline solution into your nose and through your sinuses. Neti pots and spray bottles can be purchased at [employer], a health food store, or online.  When would I do a sinus rinse? A sinus rinse can help to clear  mucus, dirt, dust, or pollen from the nasal cavity. You may do a sinus rinse when you have a cold, a virus, nasal allergy symptoms, a sinus infection, or stuffiness in the nose or sinuses. If you are considering a sinus rinse:  Ask your child's health care provider before performing a sinus rinse on your child.  Do not do a sinus rinse if you have had ear or nasal surgery, ear infection, or blocked ears.  How do I do a sinus rinse?  Wash your hands.  Disinfect your device according to the directions provided and then dry it.  Use the solution that comes with your device or one that is sold separately in stores. Follow the mixing directions on the package.  Fill your device with the amount of saline solution as directed by the device instructions.  Stand over a sink and tilt your head sideways over the sink.  Place the spout of the device in your upper nostril (the  one closer to the ceiling).  Gently pour or squeeze the saline solution into the nasal cavity. The liquid should drain to the lower nostril if you are not overly congested.  Gently blow your nose. Blowing too hard may cause ear pain.  Repeat in the other nostril.  Clean and rinse your device with clean water and then air-dry it. Are there risks of a sinus rinse? Sinus rinse is generally very safe and effective. However, there are a few risks, which include:  A burning sensation in the sinuses. This may happen if you do not make the saline solution as directed. Make sure to follow all directions when making the saline solution.  Infection from contaminated water. This is rare, but possible.  Nasal irritation.  This information is not intended to replace advice given to you by your health care provider. Make sure you discuss any questions you have with your health care provider. Document Released: 12/25/2013 Document Revised: 04/26/2016 Document Reviewed: 10/15/2013 Elsevier Interactive Patient Education  2017 [employer]. Allergic Rhinitis Allergic rhinitis is when the mucous membranes in the nose respond to allergens. Allergens are particles in the air that cause your body to have an allergic reaction. This causes you to release allergic antibodies. Through a chain of events, these eventually cause you to release histamine into the blood stream. Although meant to protect the body, it is this release of histamine that causes your discomfort, such as frequent sneezing, congestion, and an itchy, runny nose. What are the causes? Seasonal allergic rhinitis (hay fever) is caused by pollen allergens that may come from grasses, trees, and weeds. Year-round allergic rhinitis (perennial allergic rhinitis) is caused by allergens such as house dust mites, pet dander, and mold spores. What are the signs or symptoms?  Nasal stuffiness (congestion).  Itchy, runny nose with sneezing and tearing of the  eyes. How is this diagnosed? Your health care provider can help you determine the allergen or allergens that trigger your symptoms. If you and your health care provider are unable to determine the allergen, skin or blood testing may be used. Your health care provider will diagnose your condition after taking your health history and performing a physical exam. Your health care provider may assess you for other related conditions, such as asthma, pink eye, or an ear infection. How is this treated? Allergic rhinitis does not have a cure, but it can be controlled by:  Medicines that block allergy symptoms. These may include allergy shots, nasal sprays, and oral antihistamines.  Avoiding  the allergen.  Hay fever may often be treated with antihistamines in pill or nasal spray forms. Antihistamines block the effects of histamine. There are over-the-counter medicines that may help with nasal congestion and swelling around the eyes. Check with your health care provider before taking or giving this medicine. If avoiding the allergen or the medicine prescribed do not work, there are many new medicines your health care provider can prescribe. Stronger medicine may be used if initial measures are ineffective. Desensitizing injections can be used if medicine and avoidance does not work. Desensitization is when a patient is given ongoing shots until the body becomes less sensitive to the allergen. Make sure you follow up with your health care provider if problems continue. Follow these instructions at home: It is not possible to completely avoid allergens, but you can reduce your symptoms by taking steps to limit your exposure to them. It helps to know exactly what you are allergic to so that you can avoid your specific triggers. Contact a health care provider if:  You have a fever.  You develop a cough that does not stop easily (persistent).  You have shortness of breath.  You start wheezing.  Symptoms  interfere with normal daily activities. This information is not intended to replace advice given to you by your health care provider. Make sure you discuss any questions you have with your health care provider. Document Released: 02/22/2001 Document Revised: 01/29/2016 Document Reviewed: 02/04/2013 Elsevier Interactive Patient Education  2017 [employer].

## 2016-12-30 NOTE — Progress Notes (Signed)
Subjective:    Patient ID: Thomas Shaw, male    DOB: Aug 02, 1985, 31 y.o.   MRN: [id number]  30y/o african [place] male established patient here for evaluation sinus pressure, nasal congestion, headache x 1 week.  Pain behind bridge of nose/right eye greater than left.  Works at [employer] in [place] supply.  Last sinus infection one year ago.  Has felt hot at night.  Non-smoker tried tylenol, motrin and sudafed without resolution.  Motrin helped with pain.  Tylenol did not.  Ran out of zyrtec and flonase needs new Rx.      Review of Systems  Constitutional: Positive for fever. Negative for activity change, appetite change, chills, diaphoresis, fatigue and unexpected weight change.  HENT: Positive for congestion, postnasal drip, sinus pain and sinus pressure. Negative for dental problem, drooling, ear discharge, ear pain, facial swelling, hearing loss, mouth sores, nosebleeds, rhinorrhea, sneezing, sore throat, tinnitus, trouble swallowing and voice change.   Eyes: Negative for photophobia, pain, discharge, redness, itching and visual disturbance.  Respiratory: Negative for cough, choking, chest tightness, shortness of breath, wheezing and stridor.   Cardiovascular: Negative for chest pain, palpitations and leg swelling.  Gastrointestinal: Negative for abdominal distention, abdominal pain, blood in stool, constipation, diarrhea, nausea and vomiting.  Endocrine: Negative for cold intolerance and heat intolerance.  Genitourinary: Negative for dysuria.  Musculoskeletal: Negative for arthralgias, back pain, gait problem, joint swelling, myalgias, neck pain and neck stiffness.  Skin: Negative for color change, pallor, rash and wound.  Allergic/Immunologic: Positive for environmental allergies and food allergies. Negative for immunocompromised state.  Neurological: Positive for headaches. Negative for dizziness, tremors, seizures, syncope, facial asymmetry, speech difficulty, weakness, light-headedness  and numbness.  Hematological: Negative for adenopathy. Does not bruise/bleed easily.  Psychiatric/Behavioral: Negative for agitation, behavioral problems, confusion and sleep disturbance.       Objective:   Physical Exam  Constitutional: He is oriented to person, place, and time. Vital signs are normal. He appears well-developed and well-nourished. He is active and cooperative.  Non-toxic appearance. He does not have a sickly appearance. He appears ill. No distress.  HENT:  Head: Normocephalic and atraumatic.  Right Ear: Hearing, external ear and ear canal normal. A middle ear effusion is present.  Left Ear: Hearing, external ear and ear canal normal. A middle ear effusion is present.  Nose: Mucosal edema and rhinorrhea present. No nose lacerations, sinus tenderness, nasal deformity, septal deviation or nasal septal hematoma. No epistaxis.  No foreign bodies. Right sinus exhibits no maxillary sinus tenderness and no frontal sinus tenderness. Left sinus exhibits no maxillary sinus tenderness and no frontal sinus tenderness.  Mouth/Throat: Uvula is midline and mucous membranes are normal. Mucous membranes are not pale, not dry and not cyanotic. He does not have dentures. No oral lesions. No trismus in the jaw. Normal dentition. No dental abscesses, uvula swelling, lacerations or dental caries. Posterior oropharyngeal edema and posterior oropharyngeal erythema present. No oropharyngeal exudate or tonsillar abscesses.  Cobblestoning posterior pharynx; bilateral nasal turbinates edema/erythema clear discharge; bilateral allergic shiners; leans forward and states pressure behind eyes; bilateral TMs air fluid level clear  Eyes: Pupils are equal, round, and reactive to light. Conjunctivae, EOM and lids are normal. Right eye exhibits no chemosis, no discharge, no exudate and no hordeolum. No foreign body present in the right eye. Left eye exhibits no chemosis, no discharge, no exudate and no hordeolum. No  foreign body present in the left eye. Right conjunctiva is not injected. Right conjunctiva has no hemorrhage. Left  conjunctiva is not injected. Left conjunctiva has no hemorrhage. No scleral icterus. Right eye exhibits normal extraocular motion and no nystagmus. Left eye exhibits normal extraocular motion and no nystagmus. Right pupil is round and reactive. Left pupil is round and reactive. Pupils are equal.  Neck: Trachea normal, normal range of motion and phonation normal. Neck supple. No tracheal tenderness, no spinous process tenderness and no muscular tenderness present. No neck rigidity. No tracheal deviation, no edema, no erythema and normal range of motion present. No thyroid mass and no thyromegaly present.  Cardiovascular: Normal rate, regular rhythm, S1 normal, S2 normal, normal heart sounds and intact distal pulses.  PMI is not displaced.  Exam reveals no gallop and no friction rub.   No murmur heard. Pulmonary/Chest: Effort normal and breath sounds normal. No stridor. No respiratory distress. He has no decreased breath sounds. He has no wheezes. He has no rhonchi. He has no rales.  Nasally voice; speaks full sentences without difficulty; cough not observed in exam room  Abdominal: Soft. Normal appearance. He exhibits no distension. There is no rigidity and no guarding.  Musculoskeletal: Normal range of motion. He exhibits no edema or tenderness.       Right shoulder: Normal.       Left shoulder: Normal.       Right elbow: Normal.      Left elbow: Normal.       Right hip: Normal.       Left hip: Normal.       Right knee: Normal.       Left knee: Normal.       Cervical back: Normal.       Thoracic back: Normal.       Lumbar back: Normal.       Right hand: Normal.       Left hand: Normal.  On/off exam table without difficulty; gait sure and steady in hall  Lymphadenopathy:       Head (right side): No submental, no submandibular, no tonsillar, no preauricular, no posterior auricular  and no occipital adenopathy present.       Head (left side): No submental, no submandibular, no tonsillar, no preauricular, no posterior auricular and no occipital adenopathy present.    He has no cervical adenopathy.       Right cervical: No superficial cervical, no deep cervical and no posterior cervical adenopathy present.      Left cervical: No superficial cervical, no deep cervical and no posterior cervical adenopathy present.  Neurological: He is alert and oriented to person, place, and time. He has normal strength. He is not disoriented. He displays no atrophy and no tremor. No cranial nerve deficit or sensory deficit. He exhibits normal muscle tone. He displays no seizure activity. Coordination and gait normal. GCS eye subscore is 4. GCS verbal subscore is 5. GCS motor subscore is 6.  Bilateral hand grasp equal 5/5  Skin: Skin is warm, dry and intact. No abrasion, no bruising, no burn, no ecchymosis, no laceration, no lesion, no petechiae and no rash noted. He is not diaphoretic. No cyanosis or erythema. No pallor. Nails show no clubbing.  Psychiatric: He has a normal mood and affect. His speech is normal and behavior is normal. Judgment and thought content normal. Cognition and memory are normal.  Nursing note and vitals reviewed.         Assessment & Plan:  A-acute rhinosinusitis, bilateral otitis media effusion  P-start flonase 1 spray each nostril BID #1 RF0, nasal  saline 2 spray each nostril q2h wa, zyrtec 10mg  po daily and if no relief in 48 hours start augmentin 875mg  po BID x 10 days #20 RF0 electronic Rxs sent to pharmacy of choice. No evidence of systemic bacterial infection, non toxic and well hydrated.  I do not see where any further testing or imaging is necessary at this time.   I will suggest supportive care, rest, good hygiene and encourage the patient to take adequate fluids.  The patient is to return to clinic or EMERGENCY ROOM if symptoms worsen or change significantly.   Exitcare handout on sinusitis, allergic rhinitis and sinus rinse given to patient.  Patient verbalized agreement and understanding of treatment plan and had no further questions at this time.    Patient may use normal saline nasal spray as needed.  Consider antihistamine or nasal steroid use.  Avoid triggers if possible.  Shower prior to bedtime if exposed to triggers.  If allergic dust/dust mites recommend mattress/pillow covers/encasements; washing linens, vacuuming, sweeping, dusting weekly.  Call or return to clinic as needed if these symptoms worsen or fail to improve as anticipated.   Exitcare handout on allergic rhinitis given to patient.  Patient verbalized understanding of instructions, agreed with plan of care and had no further questions at this time.  P2:  Avoidance and hand washing.  Supportive treatment.   No evidence of invasive bacterial infection, non toxic and well hydrated.  This is most likely self limiting viral infection.  I do not see where any further testing or imaging is necessary at this time.   I will suggest supportive care, rest, good hygiene and encourage the patient to take adequate fluids.  The patient is to return to clinic or EMERGENCY ROOM if symptoms worsen or change significantly e.g. ear pain, fever, purulent discharge from ears or bleeding.  Exitcare handout on otitis media with effusion given to patient.  Patient verbalized agreement and understanding of treatment plan.   P2:  Hand washing and cover cough

## 2017-02-14 DIAGNOSIS — Z114 Encounter for screening for human immunodeficiency virus [HIV]: Secondary | ICD-10-CM | POA: Diagnosis not present

## 2017-02-14 DIAGNOSIS — R202 Paresthesia of skin: Secondary | ICD-10-CM | POA: Diagnosis not present

## 2017-02-14 DIAGNOSIS — D649 Anemia, unspecified: Secondary | ICD-10-CM | POA: Diagnosis not present

## 2017-02-14 DIAGNOSIS — R1013 Epigastric pain: Secondary | ICD-10-CM | POA: Diagnosis not present

## 2017-02-14 DIAGNOSIS — K8681 Exocrine pancreatic insufficiency: Secondary | ICD-10-CM | POA: Diagnosis not present

## 2017-02-14 LAB — HM HIV SCREENING LAB: HM HIV Screening: NEGATIVE

## 2017-02-27 DIAGNOSIS — D649 Anemia, unspecified: Secondary | ICD-10-CM | POA: Diagnosis not present

## 2017-03-07 ENCOUNTER — Other Ambulatory Visit: Payer: Self-pay

## 2017-04-07 ENCOUNTER — Encounter: Payer: Self-pay | Admitting: Urology

## 2017-04-07 ENCOUNTER — Ambulatory Visit (INDEPENDENT_AMBULATORY_CARE_PROVIDER_SITE_OTHER): Payer: 59 | Admitting: Urology

## 2017-04-07 VITALS — BP 110/70 | HR 70 | Ht 71.0 in | Wt 182.4 lb

## 2017-04-07 DIAGNOSIS — N4611 Organic oligospermia: Secondary | ICD-10-CM | POA: Diagnosis not present

## 2017-04-07 NOTE — Progress Notes (Signed)
04/07/2017 3:12 PM   Thomas Shaw October 06, 1985 [id number]  Referring provider: Leonel Ramsay, MD [place], [place] [address]  Chief Complaint  Patient presents with  . New Patient (Initial Visit)    abnormal seman analysis    HPI: 31 year old male referred for further evaluation of abnormal semen analysis.  He reports that he is been having unprotected intercourse with his fiance for well over a year.  During this time, they have been not actively trying to have a child but also not avoiding it if it happened.  He was curious and concerned to why she has not become pregnant over this time interval.  He has no other biological children by any other partner.  He underwent formal semen analysis on 02/22/2017 [facility] ordered by his PCP.  This showed an ejaculatory volume of 1.8 mL's.  This ejaculate sample, only 13 sperm total were identified.  Only 1 of these was morphologically normal.  His past medical history is significant for a gastric bypass procedure in 2015.  He weighed over 300 pounds at that time, now weighs 180 pounds.  He does have pancreatic insufficiency, elevated LFTs as a result of complications from the surgery.    Other than the above, he denies any acute illnesses.  He denies any substance abuse including alcohol, drugs, or any other exogenous androgens.  Normal sexual function including normal erections and orgasm.  He does have normal ejaculatory volume.  No pain with ejaculation.  His fiance is under the care of an OB/GYN.  She has irregular periods and has been started on some sort of medication which the patient was not familiar with.  No known family history of infertility, although none of his brother to sisters have kids.  He is not sure if this is related to fertility issues or by choice.   PMH: Past Medical History:  Diagnosis Date  . Anemia, unspecified 02/22/2016  . Essential hypertension 12/30/2016  . Exocrine pancreatic  insufficiency 10/15/2014  . Gastro-esophageal reflux 12/30/2016  . Vitamin D deficiency 12/30/2016    Surgical History: Past Surgical History:  Procedure Laterality Date  . GASTRIC BYPASS      Home Medications:  Allergies as of 04/07/2017      Reactions   Shellfish-derived Products Nausea And Vomiting      Medication List       Accurate as of 04/07/17  3:12 PM. Always use your most recent med list.          cetirizine 10 MG tablet Commonly known as:  ZYRTEC Take 1 tablet (10 mg total) by mouth daily.   CREON 36000 UNITS Cpep capsule Generic drug:  lipase/protease/amylase Take 4 capsules by mouth 4 (four) times daily.   D 1000 1000 units Chew Generic drug:  Cholecalciferol Chew 1 tablet by mouth daily.   EPINEPHrine 0.3 mg/0.3 mL Soaj injection Commonly known as:  EPI-PEN Inject 0.3 mLs (0.3 mg total) into the muscle once. Follow package instructions as needed for severe allergy or anaphylactic reaction.   fluticasone 50 MCG/ACT nasal spray Commonly known as:  FLONASE Place 1 spray into both nostrils 2 (two) times daily as needed for allergies or rhinitis.   MULTI-VITAMINS Tabs Take 1 tablet by mouth daily.       Allergies:  Allergies  Allergen Reactions  . Shellfish-Derived Products Nausea And Vomiting    Family History: Family History  Problem Relation Age of Onset  . Prostate cancer Neg Hx   . Bladder  Cancer Neg Hx   . Kidney cancer Neg Hx     Social History:  reports that he has never smoked. He has never used smokeless tobacco. He reports that he drinks alcohol. He reports that he does not use drugs.  ROS: UROLOGY Frequent Urination?: No Hard to postpone urination?: No Burning/pain with urination?: No Get up at night to urinate?: No Leakage of urine?: No Urine stream starts and stops?: No Trouble starting stream?: No Do you have to strain to urinate?: No Blood in urine?: No Urinary tract infection?: No Sexually transmitted disease?:  No Injury to kidneys or bladder?: No Painful intercourse?: No Weak stream?: No Erection problems?: No Penile pain?: No  Gastrointestinal Nausea?: No Vomiting?: No Indigestion/heartburn?: No Diarrhea?: No Constipation?: No  Constitutional Fever: No Night sweats?: No Weight loss?: No Fatigue?: No  Skin Skin rash/lesions?: No Itching?: No  Eyes Blurred vision?: No Double vision?: No  Ears/Nose/Throat Sore throat?: No Sinus problems?: No  Hematologic/Lymphatic Swollen glands?: No Easy bruising?: No  Cardiovascular Leg swelling?: No Chest pain?: No  Respiratory Cough?: No Shortness of breath?: No  Endocrine Excessive thirst?: No  Musculoskeletal Back pain?: No Joint pain?: No  Neurological Headaches?: No Dizziness?: No  Psychologic Depression?: No Anxiety?: No  Physical Exam: BP 110/70 (BP Location: Right Arm, Patient Position: Sitting, Cuff Size: Normal)   Pulse 70   Ht 5' 11" (1.803 m)   Wt 182 lb 6.4 oz (82.7 kg)   BMI 25.44 kg/m   Constitutional:  Alert and oriented, No acute distress. HEENT:  AT, moist mucus membranes.  Trachea midline, no masses.  Mild periorbital edema. Cardiovascular: No clubbing, cyanosis, or edema. Respiratory: Normal respiratory effort, no increased work of breathing. GI: Abdomen is soft, nontender, nondistended, no abdominal masses.  Loose abdominal and suprapubic skin. GU: Circumcised phallus, somewhat buried in loose skin.  Bilateral descended testicles which are slightly smaller bilaterally without masses.  Bilateral vasa easily palpable.  Left cord slightly thickened. Skin: No rashes, bruises or suspicious lesions.  Significant excess skin. Neurologic: Grossly intact, no focal deficits, moving all 4 extremities. Psychiatric: Normal mood and affect.  Laboratory Data: Comprehensive Metabolic Panel (CMP) (16/03/9603 9:56 AM EDT) Comprehensive Metabolic Panel (CMP) (54/02/8118 9:56 AM EDT)  Component Value Ref  Range Performed At Pathologist Signature  Glucose 84 70 - 110 mg/dL [facility]   Sodium 144 136 - 145 mmol/L [facility]   Potassium 4.1 3.6 - 5.1 mmol/L [facility]   Chloride 112 (H) 97 - 109 mmol/L [facility]   Carbon Dioxide (CO2) 30.1 22.0 - 32.0 mmol/L [facility]   Urea Nitrogen (BUN) 14 7 - 25 mg/dL [facility]   Creatinine 0.9 0.7 - 1.3 mg/dL [facility]   Glomerular Filtration Rate (eGFR), MDRD Estimate 121 >60 mL/min/1.73sq m [facility]   Calcium 9.2 8.7 - 10.3 mg/dL [facility]   AST  97 (H) 8 - 39 U/L [facility]   ALT  90 (H) 6 - 57 U/L [facility]   Alk Phos (alkaline Phosphatase) 165 (H) 34 - 104 U/L [facility]   Albumin 3.8 3.5 - 4.8 g/dL [facility]   Bilirubin, Total 1.0 0.3 - 1.2 mg/dL [facility]   Protein, Total 6.3 6.1 - 7.9  g/dL [facility]   A/G Ratio 1.5 1.0 - 5.0 gm/dL [facility]     Urinalysis N/a  Pertinent Imaging: n/a  Assessment & Plan:    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 Yolonda Kida for further evaluation of male infertility Will defer second semen analysis pending the above and referral plan - FSH/LH - TSH - Testosterone - Prolactin  Hollice Espy, MD  [facility] [address], [place], [place] [address] [phone]

## 2017-04-07 NOTE — Patient Instructions (Signed)
Infertility Infertility is when you are unable to get pregnant (conceive) after a year of having sex regularly without using birth control. Infertility can also mean that a woman is not able to carry a pregnancy to full term. Both women and men can have fertility problems. What causes infertility? What Causes Infertility in Women? There are many possible causes of infertility in women. For some women, the cause of infertility is not known (unexplained infertility). Infertility can also be linked to more than one cause. Infertility problems in women can be caused by problems with the menstrual cycle or reproductive organs, certain medical conditions, and factors related to lifestyle and age.  Problems with your menstrual cycle can interfere with your ovaries producing eggs (ovulation). This can make it difficult to get pregnant. This includes having a menstrual cycle that is very long, very short, or irregular.  Problems with reproductive organs can include: ? An abnormally narrow cervix or a cervix that does not remain closed during a pregnancy. ? A blockage in your fallopian tubes. ? An abnormally shaped uterus. ? Uterine fibroids. This is a tissue mass (tumor) that can develop on your uterus.  Medical conditions that can affect a woman's fertility include: ? Polycystic ovarian syndrome (PCOS). This is a hormonal disorder that can cause small cysts to grow on your ovaries. This is the most common cause of infertility in women. ? Endometriosis. This is a condition in which the tissue that lines your uterus (endometrium) grows outside of its normal location. ? Primary ovary insufficiency. This is when your ovaries stop producing eggs and hormones before the age of 12. ? Sexually transmitted diseases, such as chlamydia or gonorrhea. These infections can cause scarring in your fallopian tubes. This makes it difficult for eggs to reach your uterus. ? Autoimmune disorders. These are disorders in which  your immune system attacks normal, healthy cells. ? Hormone imbalances.  Other factors include: ? Age. A woman's fertility declines with age, especially after her mid-59s. ? Being under- or overweight. ? Drinking too much alcohol. ? Using drugs. ? Exercising excessively. ? Being exposed to environmental toxins, such as radiation, pesticides, and certain chemicals.  What Causes Infertility in Men? There are many causes of infertility in men. Infertility can be linked to more than one cause. Infertility problems in men can be caused by problems with sperm or the reproductive organs, certain medical conditions, and factors related to lifestyle and age. Some men have unexplained infertility.  Problems with sperm. Infertility can result if there is a problem producing: ? Enough sperm (low sperm count). ? Enough normally-shaped sperm (sperm morphology). ? Sperm that are able to reach the egg (poor motility).  Infertility can also be caused by: ? A problem with hormones. ? Enlarged veins (varicoceles), cysts (spermatoceles), or tumors of the testicles. ? Sexual dysfunction. ? Injury to the testicles. ? A birth defect, such as not having the tubes that carry sperm (vas deferens).  Medical conditions that can affect a man's fertility include: ? Diabetes. ? Cancer treatments, such as chemotherapy or radiation. ? Klinefelter syndrome. This is an inherited genetic disorder. ? Thyroid problems, such as an under- or overactive thyroid. ? Cystic fibrosis. ? Sexually transmitted diseases.  Other factors include: ? Age. A man's fertility declines with age. ? Drinking too much alcohol. ? Using drugs. ? Being exposed to environmental toxins, such as pesticides and lead.  What are the symptoms of infertility? Being unable to get pregnant after one year of having regular  sex without using birth control is the only sign of infertility. How is infertility diagnosed? In order to be diagnosed with  infertility, both partners will have a physical exam. Both partners will also have an extensive medical and sexual history taken. If there is no obvious reason for infertility, additional tests may be done. What Tests Will Women Have? Women may first have tests to check whether they are ovulating each month. The tests may include:  Blood tests to check hormone levels.  An ultrasound of the ovaries. This looks for possible problems on or in the ovaries.  Taking a small sample of the tissue that lines the uterus for examination under a microscope (endometrial biopsy).  Women who are ovulating may have additional tests. These may include:  Hysterosalpingography. ? This is an X-ray of the fallopian tubes and uterus taken after a specific type of dye is injected. ? This test can show the shape of the uterus and whether the fallopian tubes are open.  Laparoscopy. ? In this test, a lighted tube (laparoscope) is used to look for problems in the fallopian tubes and other male organs.  Transvaginal ultrasound. ? This is an imaging test to check for abnormalities of the uterus and ovaries. ? A health care provider can use this test to count the number of follicles on the ovaries.  Hysteroscopy. ? This test involves using a lighted tube to examine the cervix and inside the uterus. ? It is done to find any abnormalities inside the uterus.  What Tests Will Men Have? Tests for men's infertility includes:  Semen tests to check sperm count, morphology, and motility.  Blood tests to check for hormone levels.  Taking a small sample of tissue from inside a testicle (biopsy). This is examined under a microscope.  Blood tests to check for genetic abnormalities (genetic testing).  How are women treated for infertility? Treatment depends on the cause of infertility. Most cases of infertility in women are treated with medicine or surgery.  Women may take medicine to: ? Correct ovulation  problems. ? Treat other health conditions, such as PCOS.  Surgery may be done to: ? Repair damage to the ovaries, fallopian tubes, cervix, or uterus. ? Remove growths from the uterus. ? Remove scar tissue from the uterus, pelvis, or other male organs.  How are men treated for infertility? Treatment depends on the cause of infertility. Most cases of infertility in men are treated with medicine or surgery.  Men may take medicine to: ? Correct hormone problems. ? Treat other health conditions. ? Treat sexual dysfunction.  Surgery may be done to: ? Remove blockages in the reproductive tract. ? Correct other structural problems of the reproductive tract.  What is assisted reproductive technology? Assisted reproductive technology (ART) refers to all treatments and procedures that combine eggs and sperm outside the body to try to help a couple conceive. ART is often combined with fertility drugs to stimulate ovulation. Sometimes ART is done using eggs retrieved from another woman's body (donor eggs) or from previously frozen fertilized eggs (embryos). There are different types of ART. These include:  Intrauterine insemination (IUI). ? In this procedure, sperm is placed directly into a woman's uterus with a long, thin tube. ? This may be most effective for infertility caused by sperm problems, including low sperm count and low motility. ? Can be used in combination with fertility drugs.  In vitro fertilization (IVF). ? This is often done when a woman's fallopian tubes are blocked or when  combined with fertility drugs to stimulate ovulation. Sometimes ART is done using eggs retrieved from another woman's body (donor eggs) or from previously frozen fertilized eggs (embryos).  There are different types of ART. These include:   Intrauterine insemination (IUI).  ? In this procedure, sperm is placed directly into a woman's uterus with a long, thin tube.  ? This may be most effective for infertility caused by sperm problems, including low sperm count and low motility.  ? Can be used in combination with fertility drugs.   In vitro fertilization (IVF).  ? This is often done when a woman's fallopian tubes are blocked or when a man has low sperm counts.  ? Fertility drugs stimulate the ovaries to produce multiple eggs. Once mature, these eggs are removed from the body and combined with the sperm to be fertilized.  ? These fertilized eggs are then placed in the woman's uterus.    This information is not intended to replace advice given to you by your health care provider. Make sure you discuss any questions you have with your  health care provider.  Document Released: 06/02/2003 Document Revised: 10/30/2015 Document Reviewed: 02/12/2014  Elsevier Interactive Patient Education  2018 [employer]

## 2017-04-08 LAB — TESTOSTERONE: TESTOSTERONE: 587 ng/dL (ref 264–916)

## 2017-04-08 LAB — FSH/LH
FSH: 14.5 m[IU]/mL — AB (ref 1.5–12.4)
LH: 9.5 m[IU]/mL — AB (ref 1.7–8.6)

## 2017-04-08 LAB — TSH: TSH: 2.26 u[IU]/mL (ref 0.450–4.500)

## 2017-04-08 LAB — PROLACTIN: PROLACTIN: 8 ng/mL (ref 4.0–15.2)

## 2017-04-17 ENCOUNTER — Telehealth: Payer: Self-pay | Admitting: Urology

## 2017-04-17 DIAGNOSIS — N4611 Organic oligospermia: Secondary | ICD-10-CM

## 2017-04-17 NOTE — Telephone Encounter (Signed)
Called patient to personally discuss his laboratory data.  His testosterone is normal but his FSH and LH are elevated.  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  I would like him to refer him to see Dr. Yolonda Kida at [facility] infertility clinic.  He is agreeable to this plan.  Please make arrangements.  Order for referral placed here.  Hollice Espy, MD

## 2017-04-18 NOTE — Telephone Encounter (Signed)
Referral faxed to [facility] @ [phone]  Phone# [phone] They will contact the patient with an app Called patient to let him know that this had been done. Had to leave a message for him to call back   Sharyn Lull

## 2017-05-25 DIAGNOSIS — E291 Testicular hypofunction: Secondary | ICD-10-CM | POA: Diagnosis not present

## 2017-07-17 ENCOUNTER — Ambulatory Visit
Admission: EM | Admit: 2017-07-17 | Discharge: 2017-07-17 | Disposition: A | Discharge status: Home / Self Care | Payer: Commercial Managed Care - PPO | Attending: Family Medicine | Admitting: Family Medicine

## 2017-07-17 ENCOUNTER — Other Ambulatory Visit: Payer: Self-pay

## 2017-07-17 ENCOUNTER — Encounter: Payer: Self-pay | Admitting: Emergency Medicine

## 2017-07-17 DIAGNOSIS — R69 Illness, unspecified: Secondary | ICD-10-CM | POA: Diagnosis not present

## 2017-07-17 DIAGNOSIS — R6883 Chills (without fever): Secondary | ICD-10-CM

## 2017-07-17 DIAGNOSIS — M791 Myalgia, unspecified site: Secondary | ICD-10-CM | POA: Diagnosis not present

## 2017-07-17 DIAGNOSIS — R05 Cough: Secondary | ICD-10-CM

## 2017-07-17 DIAGNOSIS — R0981 Nasal congestion: Secondary | ICD-10-CM | POA: Diagnosis not present

## 2017-07-17 DIAGNOSIS — J111 Influenza due to unidentified influenza virus with other respiratory manifestations: Secondary | ICD-10-CM

## 2017-07-17 MED ORDER — OSELTAMIVIR PHOSPHATE 75 MG PO CAPS: 75.0000 mg | ORAL_CAPSULE | Freq: Two times a day (BID) | ORAL | 0 refills | Status: DC

## 2017-07-17 NOTE — ED Triage Notes (Signed)
Patient in today c/o 3 day history of nasal congestion, body aches, fatigue, chills and sweats. Patient has not taken his temperature. Patient has been using OTC Dayquil/Nyquil.

## 2017-07-17 NOTE — Discharge Instructions (Signed)
Take medication as prescribed. Rest. Drink plenty of fluids.  ° °Follow up with your primary care physician this week as needed. Return to Urgent care for new or worsening concerns.  ° °

## 2017-07-17 NOTE — ED Provider Notes (Signed)
[facility] ____________________________________________  Time seen: Approximately 7824 AM  I have reviewed the triage vital signs and the nursing notes.   HISTORY  Chief Complaint Nasal Congestion (APPT)   HPI Thomas Shaw is a 32 y.o. male pending for evaluation of chills, body aches, hot sweats, nasal congestion and some coughing that is been present since Friday night into Saturday.  Reports quick onset of symptoms.  States has felt like he has a fever, but has not measured.  Has been taken some over-the-counter DayQuil and NyQuil.  States has overall continued to eat and drink well.  No vomiting, one loose stool yesterday, but none recurrent.  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  Denies recent sickness.   Denies chest pain, shortness of breath, abdominal pain, dysuria, or rash. Denies recent sickness. Denies recent antibiotic use.    Past Medical History:  Diagnosis Date  . Anemia, unspecified 02/22/2016  . Essential hypertension 12/30/2016  . Exocrine pancreatic insufficiency 10/15/2014  . Gastro-esophageal reflux 12/30/2016  . Vitamin D deficiency 12/30/2016    Patient Active Problem List   Diagnosis Date Noted  . Essential hypertension 12/30/2016  . Obstructive sleep apnea of adult 12/30/2016  . Gastro-esophageal reflux 12/30/2016  . Vitamin D deficiency 12/30/2016  . Anemia, unspecified 02/22/2016  . Bilateral leg paresthesia 02/22/2016  . Epigastric pain 04/15/2015  . Exocrine pancreatic insufficiency 10/15/2014    Past Surgical History:  Procedure Laterality Date  . GASTRIC BYPASS       No current facility-administered medications for this encounter.   Current Outpatient Medications:  .  CREON 36000 units CPEP capsule, Take 4 capsules by mouth 4 (four) times daily., Disp: , Rfl: 11 .  EPINEPHrine 0.3 mg/0.3 mL IJ SOAJ injection, Inject 0.3 mLs (0.3 mg total) into the muscle once. Follow  package instructions as needed for severe allergy or anaphylactic reaction., Disp: 1 Device, Rfl: 2 .  oseltamivir (TAMIFLU) 75 MG capsule, Take 1 capsule (75 mg total) by mouth every 12 (twelve) hours., Disp: 10 capsule, Rfl: 0  Allergies Patient has no known allergies.  Family History  Problem Relation Age of Onset  . Pancreatic disease Father   . Diabetes Father   . Diabetes Mother   . Prostate cancer Neg Hx   . Kidney cancer Neg Hx   . Bladder Cancer Neg Hx     Social History Social History   Tobacco Use  . Smoking status: Never Smoker  . Smokeless tobacco: Never Used  Substance Use Topics  . Alcohol use: Yes    Frequency: Never    Comment: rarely  . Drug use: No    Review of Systems Constitutional: As above.  Eyes: No visual changes. ENT: No sore throat. Cardiovascular: Denies chest pain. Respiratory: Denies shortness of breath. Gastrointestinal: No abdominal pain. Genitourinary: Negative for dysuria. Skin: Negative for rash.   ____________________________________________   PHYSICAL EXAM:  VITAL SIGNS: ED Triage Vitals [07/17/17 1047]  Enc Vitals Group     BP 124/66     Pulse Rate (!) 104     Resp 16     Temp 98.7 F (37.1 C)     Temp Source Oral     SpO2 100 %     Weight 175 lb (79.4 kg)     Height 5\' 11"  (1.803 m)     Head Circumference      Peak Flow      Pain Score 0  Pain Loc      Pain Edu?      Excl. in [place]?    Constitutional: Alert and oriented. Well appearing and in no acute distress. Eyes: Conjunctivae are normal.  Head: Atraumatic. No sinus tenderness to palpation. No swelling. No erythema.  Ears: no erythema, normal TMs bilaterally.   Nose:Nasal congestion   Mouth/Throat: Mucous membranes are moist. No pharyngeal erythema. No tonsillar swelling or exudate.  Neck: No stridor.  No cervical spine tenderness to palpation. Hematological/Lymphatic/Immunilogical: No cervical lymphadenopathy. Cardiovascular: Normal rate, regular  rhythm. Grossly normal heart sounds.  Good peripheral circulation. Respiratory: Normal respiratory effort.  No retractions. No wheezes, rales or rhonchi. Good air movement.  Gastrointestinal: Soft and nontender.  Musculoskeletal: Ambulatory with steady gait. No cervical, thoracic or lumbar tenderness to palpation. Neurologic:  Normal speech and language. No gait instability. Skin:  Skin appears warm, dry and intact. No rash noted. Psychiatric: Mood and affect are normal. Speech and behavior are normal.  ___________________________________________   LABS (all labs ordered are listed, but only abnormal results are displayed)  Labs Reviewed - No data to display   PROCEDURES Procedures   INITIAL IMPRESSION / ASSESSMENT AND PLAN / ED COURSE  Pertinent labs & imaging results that were available during my care of the patient were reviewed by me and considered in my medical decision making (see chart for details).  Well-appearing patient.  No acute distress.  2.5 days of symptoms.  Suspect influenza.  Discussed treatment with Tamiflu, Rx given.  Continue home over-the-counter medications as needed.  Encourage rest, fluids, supportive care.  Discussed strict follow-up and return parameters.  Work note given for today and tomorrow. Discussed indication, risks and benefits of medications with patient.  Discussed follow up with Primary care physician this week. Discussed follow up and return parameters including no resolution or any worsening concerns. Patient verbalized understanding and agreed to plan.   ____________________________________________   FINAL CLINICAL IMPRESSION(S) / ED DIAGNOSES  Final diagnoses:  Influenza-like illness     ED Discharge Orders        Ordered    oseltamivir (TAMIFLU) 75 MG capsule  Every 12 hours     07/17/17 1144       Note: This dictation was prepared with Dragon dictation along with smaller phrase technology. Any transcriptional errors that result  from this process are unintentional.         Marylene Land, NP 07/17/17 1331

## 2017-08-14 ENCOUNTER — Emergency Department
Admission: EM | Admit: 2017-08-14 | Discharge: 2017-08-14 | Disposition: A | Discharge status: Home / Self Care | Payer: Commercial Managed Care - PPO | Attending: Emergency Medicine | Admitting: Emergency Medicine

## 2017-08-14 ENCOUNTER — Encounter: Payer: Self-pay | Admitting: Emergency Medicine

## 2017-08-14 ENCOUNTER — Other Ambulatory Visit: Payer: Self-pay

## 2017-08-14 DIAGNOSIS — R55 Syncope and collapse: Secondary | ICD-10-CM | POA: Insufficient documentation

## 2017-08-14 DIAGNOSIS — I10 Essential (primary) hypertension: Secondary | ICD-10-CM | POA: Insufficient documentation

## 2017-08-14 LAB — CBC
HCT: 37.9 % — ABNORMAL LOW (ref 40.0–52.0)
HEMOGLOBIN: 12.2 g/dL — AB (ref 13.0–18.0)
MCH: 28.8 pg (ref 26.0–34.0)
MCHC: 32.2 g/dL (ref 32.0–36.0)
MCV: 89.5 fL (ref 80.0–100.0)
PLATELETS: 147 10*3/uL — AB (ref 150–440)
RBC: 4.23 MIL/uL — ABNORMAL LOW (ref 4.40–5.90)
RDW: 13.8 % (ref 11.5–14.5)
WBC: 3.8 10*3/uL (ref 3.8–10.6)

## 2017-08-14 LAB — TROPONIN I: Troponin I: <0.03 ng/mL (ref <0.03)

## 2017-08-14 LAB — COMPREHENSIVE METABOLIC PANEL
ALBUMIN: 4.3 g/dL (ref 3.5–5.0)
ALT: 65 U/L — ABNORMAL HIGH (ref 17–63)
ANION GAP: 8 (ref 5–15)
AST: 72 U/L — ABNORMAL HIGH (ref 15–41)
Alkaline Phosphatase: 240 U/L — ABNORMAL HIGH (ref 38–126)
BUN: 14 mg/dL (ref 6–20)
CHLORIDE: 112 mmol/L — AB (ref 101–111)
CO2: 22 mmol/L (ref 22–32)
Calcium: 7.1 mg/dL — ABNORMAL LOW (ref 8.9–10.3)
Creatinine, Ser: 0.61 mg/dL (ref 0.61–1.24)
GFR calc Af Amer: >60 mL/min (ref >60)
GFR calc non Af Amer: >60 mL/min (ref >60)
GLUCOSE: 90 mg/dL (ref 65–99)
POTASSIUM: 3.8 mmol/L (ref 3.5–5.1)
SODIUM: 142 mmol/L (ref 135–145)
Total Bilirubin: 1.1 mg/dL (ref 0.3–1.2)
Total Protein: 7.4 g/dL (ref 6.5–8.1)

## 2017-08-14 MED ORDER — SODIUM CHLORIDE 0.9 % IV SOLN
1000.0000 mL | Freq: Once | INTRAVENOUS | Status: AC
  Administered 2017-08-14: 1000 mL via INTRAVENOUS

## 2017-08-14 NOTE — ED Provider Notes (Signed)
-----------------------------------------   11:10 AM on 08/14/2017 -----------------------------------------  [facility] Department of Emergency Medicine   Code Blue CONSULT NOTE  Chief Complaint: Cardiac arrest/unresponsive   Level V Caveat: Unresponsive  History of present illness: I was contacted by the hospital for a CODE BLUE cardiac arrest upstairs and presented to the patient's patient.  Patient had a witnessed syncopal event with no seizure activity, we did check a blood sugar was 80.  By the time I got there he was awake and talking we transported him to the emergency room  ROS: Patient states he felt lightheaded otherwise no chest pain or shortness breath no nausea no vomiting no fever no chills no headache, no melena no bright red blood per rectum no hematemesis.  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 sodium chloride     PRN Meds:. Past Medical History:  Diagnosis Date  . Anemia, unspecified 02/22/2016  . Essential hypertension 12/30/2016  . Exocrine pancreatic insufficiency 10/15/2014  . Gastro-esophageal reflux 12/30/2016  . Vitamin D deficiency 12/30/2016   Past Surgical History:  Procedure Laterality Date  . GASTRIC BYPASS     Social History   Socioeconomic History  . Marital status: Single    Spouse name: Not on file  . Number of children: Not on file  . Years of education: Not on file  . Highest education level: Not on file  Social Needs  . Financial resource strain: Not on file  . Food insecurity - worry: Not on file  . Food insecurity - inability: Not on file  . Transportation needs - medical: Not on file  . Transportation needs - non-medical: Not on file  Occupational History  . Not on file  Tobacco Use  . Smoking status: Never Smoker  . Smokeless tobacco: Never Used  Substance and Sexual Activity  . Alcohol use: Yes     Frequency: Never    Comment: rarely  . Drug use: No  . Sexual activity: Not on file  Other Topics Concern  . Not on file  Social History Narrative  . Not on file   No Known Allergies  Last set of Vital Signs (not current) Vitals:   08/14/17 1103  BP: 111/69  Pulse: 66  Resp: 16  Temp: (!) 97.5 F (36.4 C)  SpO2: 99%      Physical Exam Gen: Patient awake responsive Cardiovascular: pulseless  Neck, nontender Resp: apneic. Breath sounds equal bilaterally  Abd: nondistended  Neuro: ACS 15 in no acute distress nonfocal HEENT: No blood in posterior pharynx, gag reflex absent   Musculoskeletal: No deformity  Skin: warm  Procedures  None  CRITICAL CARE  none Cardiopulmonary Resuscitation (CPR) Procedure Note Not applicable Medical Decision making  Patient with a syncopal event, awake and alert in no acute distress upon my arrival, transported to the emergency room for further evaluation.  Blood sugar is 80.  I did escort the patient personally he went by stretcher.  Assessment and Plan  Syncope, unclear cause, signed over upon arrival to the emergency department   Thomas Amor, MD 08/14/17 1113

## 2017-08-14 NOTE — ED Triage Notes (Signed)
Pt at work , felt dizzy lightheaded , pt had positive loss of consciousness. Pt arrived to ER alert and oriented, speaking in complete sentences

## 2017-08-14 NOTE — Discharge Instructions (Signed)
Discussed hypocalcemia with patient, will f/u with pcp

## 2017-08-14 NOTE — ED Notes (Signed)
Esign not working pt verbalized discharge instructions and has no questions at this time 

## 2017-08-14 NOTE — Progress Notes (Signed)
Chaplain responded to a Code Blue in the [place]. Chaplain offered presence and spiritual support. A follow-up in the ED is recommended.

## 2017-08-21 LAB — GLUCOSE, CAPILLARY: GLUCOSE-CAPILLARY: 80 mg/dL (ref 65–99)

## 2017-09-19 LAB — HM HEPATITIS C SCREENING LAB: HM Hepatitis Screen: NEGATIVE

## 2017-09-20 ENCOUNTER — Other Ambulatory Visit: Payer: Self-pay | Admitting: Internal Medicine

## 2017-09-20 DIAGNOSIS — R945 Abnormal results of liver function studies: Principal | ICD-10-CM

## 2017-09-20 DIAGNOSIS — R7989 Other specified abnormal findings of blood chemistry: Secondary | ICD-10-CM

## 2017-09-25 ENCOUNTER — Ambulatory Visit
Admission: RE | Admit: 2017-09-25 | Discharge: 2017-09-25 | Disposition: A | Discharge status: Home / Self Care | Payer: No Typology Code available for payment source | Source: Ambulatory Visit | Attending: Internal Medicine | Admitting: Internal Medicine

## 2017-09-25 DIAGNOSIS — R7989 Other specified abnormal findings of blood chemistry: Secondary | ICD-10-CM

## 2017-09-25 DIAGNOSIS — R945 Abnormal results of liver function studies: Secondary | ICD-10-CM

## 2017-09-25 DIAGNOSIS — K828 Other specified diseases of gallbladder: Secondary | ICD-10-CM | POA: Insufficient documentation

## 2017-10-23 ENCOUNTER — Encounter: Payer: Self-pay | Admitting: Family Medicine

## 2017-10-23 ENCOUNTER — Ambulatory Visit (INDEPENDENT_AMBULATORY_CARE_PROVIDER_SITE_OTHER): Payer: Self-pay | Admitting: Family Medicine

## 2017-10-23 VITALS — BP 118/70 | HR 73 | Temp 98.5°F | Wt 182.0 lb

## 2017-10-23 DIAGNOSIS — M5431 Sciatica, right side: Secondary | ICD-10-CM

## 2017-10-23 MED ORDER — CYCLOBENZAPRINE HCL 10 MG PO TABS: 10.0000 mg | ORAL_TABLET | Freq: Three times a day (TID) | ORAL | 0 refills | Status: DC | PRN

## 2017-10-23 MED ORDER — PREDNISONE 20 MG PO TABS: ORAL_TABLET | ORAL | 0 refills | Status: DC

## 2017-10-23 NOTE — Progress Notes (Signed)
Subjective:  Thomas Shaw is a 32 y.o. male who presents for evaluation of low back pain. The patient has had recurrent self limited episodes of low back pain in the past. Symptoms have been present for 2 days and are gradually worsening.  Onset was related to a prior injury which precipitated intermittent episodes of back pain. The pain is located in the right sacroiliac area or radiating to right legs and radiates to the buttock, right thigh, right lower leg. The pain is described as aching and burning and occurs with bending, walking, and standing. He rates his pain as moderate. Symptoms are improved by rest. He has also tried rest and use of a back brace, which provided no symptom relief. He has numbness and tingling in the right leg and tingling in the left leg which has been chronic for several months and is currently being follow-up on by his PCP. He denies any symptoms which would raise suspicion for cauda equina.   ROS Pertinent negatives listed in HPI   Objective:  Physical Exam  Constitutional: He is oriented to person, place, and time. He appears well-developed and well-nourished.  HENT:  Head: Normocephalic and atraumatic.  Neck: Normal range of motion. Neck supple.  Cardiovascular: Regular rhythm, normal heart sounds and intact distal pulses.  Pulmonary/Chest: Effort normal and breath sounds normal.  Neurological: He is alert and oriented to person, place, and time. GCS eye subscore is 4. GCS verbal subscore is 5. GCS motor subscore is 6.  Reflex Scores:      Patellar reflexes are 1+ on the right side and 2+ on the left side. Skin: Skin is warm and dry.  Psychiatric: He has a normal mood and affect. His behavior is normal. Judgment and thought content normal.   Assessment and Plan:  1. Sciatica of right side, will trial a course of prednisone to decrease inflammation and cyclobenzaprine for pain management. Patient is being taken out if work tonight to allow for rest and to start  first dose of medication. He is also encouraged to keep his follow-up appointment with his PCP scheduled for 10/30/17 to address bilateral leg numbness which he has previously received evaluation for by PCP. No imaging performed, as imaging is not available at this sight. Patient given strict follow-up precaution indicating if symptoms haven't markedly improved within 5 days, follow-up with PCP.   The patient was given clear instructions to go to ER, PCP. or return to medical clinic if symptoms do not improve, worsen or new problems develop. The patient verbalized understanding.  Meds ordered this encounter  Medications  . cyclobenzaprine (FLEXERIL) 10 MG tablet    Sig: Take 1 tablet (10 mg total) by mouth 3 (three) times daily as needed for muscle spasms.    Dispense:  30 tablet    Refill:  0  . predniSONE (DELTASONE) 20 MG tablet    Sig: Take 3 PO QAM x3days, 2 PO QAM x3days, 1 PO QAM x3days    Dispense:  18 tablet    Refill:  0    Carroll Sage. Kenton Kingfisher, MSN, FNP-C [facility]  [place], [place] [address] [phone]

## 2017-10-23 NOTE — Patient Instructions (Signed)
If no improvement within 4-5 days, follow-up sooner with PCP.   Sciatica Sciatica is pain, numbness, weakness, or tingling along your sciatic nerve. The sciatic nerve starts in the lower back and goes down the back of each leg. Sciatica happens when this nerve is pinched or has pressure put on it. Sciatica usually goes away on its own or with treatment. Sometimes, sciatica may keep coming back (recur). Follow these instructions at home: Medicines  Take over-the-counter and prescription medicines only as told by your doctor.  Do not drive or use heavy machinery while taking prescription pain medicine. Managing pain  If directed, put ice on the affected area. ? Put ice in a plastic bag. ? Place a towel between your skin and the bag. ? Leave the ice on for 20 minutes, 2-3 times a day.  After icing, apply heat to the affected area before you exercise or as often as told by your doctor. Use the heat source that your doctor tells you to use, such as a moist heat pack or a heating pad. ? Place a towel between your skin and the heat source. ? Leave the heat on for 20-30 minutes. ? Remove the heat if your skin turns bright red. This is especially important if you are unable to feel pain, heat, or cold. You may have a greater risk of getting burned. Activity  Return to your normal activities as told by your doctor. Ask your doctor what activities are safe for you. ? Avoid activities that make your sciatica worse.  Take short rests during the day. Rest in a lying or standing position. This is usually better than sitting to rest. ? When you rest for a long time, do some physical activity or stretching between periods of rest. ? Avoid sitting for a long time without moving. Get up and move around at least one time each hour.  Exercise and stretch regularly, as told by your doctor.  Do not lift anything that is heavier than 10 lb (4.5 kg) while you have symptoms of sciatica. ? Avoid lifting heavy  things even when you do not have symptoms. ? Avoid lifting heavy things over and over.  When you lift objects, always lift in a way that is safe for your body. To do this, you should: ? Bend your knees. ? Keep the object close to your body. ? Avoid twisting. General instructions  Use good posture. ? Avoid leaning forward when you are sitting. ? Avoid hunching over when you are standing.  Stay at a healthy weight.  Wear comfortable shoes that support your feet. Avoid wearing high heels.  Avoid sleeping on a mattress that is too soft or too hard. You might have less pain if you sleep on a mattress that is firm enough to support your back.  Keep all follow-up visits as told by your doctor. This is important. Contact a doctor if:  You have pain that: ? Wakes you up when you are sleeping. ? Gets worse when you lie down. ? Is worse than the pain you have had in the past. ? Lasts longer than 4 weeks.  You lose weight for without trying. Get help right away if:  You cannot control when you pee (urinate) or poop (have a bowel movement).  You have weakness in any of these areas and it gets worse. ? Lower back. ? Lower belly (pelvis). ? Butt (buttocks). ? Legs.  You have redness or swelling of your back.  You have a  burning feeling when you pee. This information is not intended to replace advice given to you by your health care provider. Make sure you discuss any questions you have with your health care provider. Document Released: 03/08/2008 Document Revised: 11/05/2015 Document Reviewed: 02/06/2015 Elsevier Interactive Patient Education  [employer].

## 2017-10-23 NOTE — Progress Notes (Deleted)
Patient ID: Thomas Shaw, male    DOB: [birth year], 32 y.o.   MRN: [id number]  PCP: Leonel Ramsay, MD  Chief Complaint  Patient presents with  . focus-right lower lbut/leg pain    Subjective:  HPI Thomas Shaw is a 32 y.o. male presents for evaluation    Social History   Socioeconomic History  . Marital status: Single    Spouse name: Not on file  . Number of children: Not on file  . Years of education: Not on file  . Highest education level: Not on file  Occupational History  . Not on file  Social Needs  . Financial resource strain: Not on file  . Food insecurity:    Worry: Not on file    Inability: Not on file  . Transportation needs:    Medical: Not on file    Non-medical: Not on file  Tobacco Use  . Smoking status: Never Smoker  . Smokeless tobacco: Never Used  Substance and Sexual Activity  . Alcohol use: Yes    Frequency: Never    Comment: rarely  . Drug use: No  . Sexual activity: Not on file  Lifestyle  . Physical activity:    Days per week: Not on file    Minutes per session: Not on file  . Stress: Not on file  Relationships  . Social connections:    Talks on phone: Not on file    Gets together: Not on file    Attends religious service: Not on file    Active member of club or organization: Not on file    Attends meetings of clubs or organizations: Not on file    Relationship status: Not on file  . Intimate partner violence:    Fear of current or ex partner: Not on file    Emotionally abused: Not on file    Physically abused: Not on file    Forced sexual activity: Not on file  Other Topics Concern  . Not on file  Social History Narrative  . Not on file    Family History  Problem Relation Age of Onset  . Pancreatic disease Father   . Diabetes Father   . Diabetes Mother   . Prostate cancer Neg Hx   . Kidney cancer Neg Hx   . Bladder Cancer Neg Hx      Review of Systems  Patient Active Problem List   Diagnosis Date Noted   . Essential hypertension 12/30/2016  . Obstructive sleep apnea of adult 12/30/2016  . Gastro-esophageal reflux 12/30/2016  . Vitamin D deficiency 12/30/2016  . Anemia, unspecified 02/22/2016  . Bilateral leg paresthesia 02/22/2016  . Epigastric pain 04/15/2015  . Exocrine pancreatic insufficiency 10/15/2014    No Known Allergies  Prior to Admission medications   Medication Sig Start Date End Date Taking? Authorizing Provider  Cholecalciferol (D-5000) 5000 units TABS Take by mouth.   Yes [provider]  CREON 36000 units CPEP capsule Take 4 capsules by mouth 4 (four) times daily. 10/12/15  Yes [provider]  EPINEPHrine 0.3 mg/0.3 mL IJ SOAJ injection Inject 0.3 mLs (0.3 mg total) into the muscle once. Follow package instructions as needed for severe allergy or anaphylactic reaction. 12/08/15  Yes Carrie Mew, MD  Multiple Vitamin (MULTI-VITAMINS) TABS Take by mouth.    [provider]  oseltamivir (TAMIFLU) 75 MG capsule Take 1 capsule (75 mg total) by mouth every 12 (twelve) hours. Patient not taking: Reported on 10/23/2017 07/17/17   Sabra Heck,  Mendel Ryder, NP    Past Medical, Surgical Family and Social History reviewed and updated.    Objective:   Today's Vitals   10/23/17 1434  BP: 118/70  Pulse: 73  Temp: 98.5 F (36.9 C)  SpO2: 98%  Weight: 182 lb (82.6 kg)  PainSc: 10-Worst pain ever    Wt Readings from Last 3 Encounters:  10/23/17 182 lb (82.6 kg)  08/14/17 180 lb (81.6 kg)  07/17/17 175 lb (79.4 kg)    Physical Exam         Assessment & Plan:  There are no diagnoses linked to this encounter.  If symptoms worsen or do not improve, return for follow-up, follow-up with PCP, or at the emergency department if severity of symptoms warrant a higher level of care.     Carroll Sage. Kenton Kingfisher, MSN, FNP-C [facility]  [place], [place] [address] [phone]

## 2017-10-25 ENCOUNTER — Telehealth: Payer: Self-pay | Admitting: Emergency Medicine

## 2017-10-25 NOTE — Telephone Encounter (Signed)
Contacted patient advised him to seek further evaluation via Urgent care or E.D patient acknowledge understanding.

## 2017-10-25 NOTE — Telephone Encounter (Signed)
Contacted patient following up on visit with [facility]. Per patient pain level is still a 10. Prednisone and flexeril not helping. I suggested to patient to go to Urgent care or E.D maybe imaging is needed. He wanted to know is there something else that can be recommended to easy his pain. Please advise

## 2017-11-28 ENCOUNTER — Ambulatory Visit: Payer: No Typology Code available for payment source | Attending: Family Medicine

## 2017-11-28 DIAGNOSIS — M545 Low back pain: Secondary | ICD-10-CM | POA: Diagnosis present

## 2017-11-28 DIAGNOSIS — M5416 Radiculopathy, lumbar region: Secondary | ICD-10-CM | POA: Insufficient documentation

## 2017-11-28 DIAGNOSIS — M6281 Muscle weakness (generalized): Secondary | ICD-10-CM | POA: Diagnosis present

## 2017-11-28 NOTE — Patient Instructions (Signed)
Prone on elbows   Lie down on your stomach and prop yourself up onto your elbows   Feet turned in   Take deep breaths for 2 minutes.     Perform 3 sets daily.       Bridge   Lie on back, legs bent. Squeeze rear end muscles together. Lift hips up. Repeat __10__ times. Do __3__ sessions per day.  Copyright  VHI. All rights reserved.

## 2017-11-28 NOTE — Therapy (Signed)
[place] [facility] [address] [place], [address] Phone: [phone]   Fax:  [phone]  Physical Therapy Evaluation  Patient Details  Name: Thomas Shaw MRN: [id number] Date of Birth: [birth year] Referring Provider: Allene Dillon, NP   Encounter Date: 11/28/2017  PT End of Session - 11/28/17 1436    Visit Number  1    Number of Visits  11    Date for PT Re-Evaluation  01/04/18    PT Start Time  7782    PT Stop Time  1530    PT Time Calculation (min)  53 min    Activity Tolerance  Patient tolerated treatment well    Behavior During Therapy  [facility] for tasks assessed/performed       Past Medical History:  Diagnosis Date  . Anemia, unspecified 02/22/2016  . Essential hypertension 12/30/2016  . Exocrine pancreatic insufficiency 10/15/2014  . Gastro-esophageal reflux 12/30/2016  . Vitamin D deficiency 12/30/2016    Past Surgical History:  Procedure Laterality Date  . GASTRIC BYPASS      There were no vitals filed for this visit.   Subjective Assessment - 11/28/17 1441    Subjective  R low back and R posterior hip and thigh pain: 0/10 currently (pt sitting on chair), 10/10 at worst for the past 3 weeks.     Pertinent History  Low back pain with radiating symptoms R LE. Pt feels pain from R low back to R posterior hip and proximal thigh. When pain occurs, it causes him to straighten out his R LE.  Pain began about 3 months ago. 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 Pain went away but last month, pain returned.  Pt also started working at [employer] 3 months ago. Pt feels like the extra work made his pain retun.   Did a lot of lifting at [employer] and had to stop working at [employer] [facility] due to pain.  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 Currently working at the [employer] and doing ok with it.   Pt lifts 50 lbs at most for [employer], and 30 lbs at most at  the hospital.     Patient Stated Goals  Get his core strengthened.     Currently in Pain?  No/denies    Pain Score  0-No pain pt sitting    Pain Location  Back    Pain Orientation  Right    Pain Descriptors / Indicators  Sharp    Pain Type  Acute pain    Pain Radiating Towards  R posterior hip, thigh    Pain Onset  More than a month ago    Pain Frequency  Occasional    Aggravating Factors   Pt can just be walking around the grocery store and pain can just occur. Lifting, standing for prolonged periods.     Pain Relieving Factors  R sidelying (muscle relaxers did not work), use of back brace         [facility] PT Assessment - 11/28/17 1454      Assessment   Medical Diagnosis  Lumbar DDD, lumbar radiculitis    Referring Provider  Allene Dillon, NP    Onset Date/Surgical Date  11/07/17    Prior Therapy  No known PT for current condition      Precautions   Precaution Comments  No known precautions  Restrictions   Other Position/Activity Restrictions  No known restrictions      Prior Function   Vocation Requirements  PLOF: better able to lift 30 lbs to 50 lbs, ambulate long distances, and tolerate prolonged standing with minimal to no back and R LE pain.       Observation/Other Assessments   Observations  (-) SLR test, Long sit test suggests possible anterior nutation of R innominate.     Focus on Therapeutic Outcomes (FOTO)   Back FOTO 76    Modified [place]  16%      Posture/Postural Control   Posture Comments  Protracted neck, slight L lateral shift, bilateral foot pronation       AROM   Overall AROM Comments  Overpressure applied as well     Lumbar Flexion  full with low back soreness with repeated flexion    Lumbar Extension  full with R trunk rotation     Lumbar - Right Side Bend  Full reproduced R hip pain with overpressure    Lumbar - Left Side Bend  Full     Lumbar - Right Rotation  full  sitting    Lumbar - Left Rotation  full sitting      Strength   Right  Hip Flexion  4-/5    Right Hip Extension  3+/5    Right Hip ABduction  4+/5    Left Hip Flexion  4-/5    Left Hip Extension  4-/5    Left Hip ABduction  4+/5    Right Knee Flexion  4-/5    Right Knee Extension  5/5    Left Knee Flexion  4-/5    Left Knee Extension  5/5      Palpation   Palpation comment  No TTP to low back or bilateral posterior hips. Slight R rotation of L 5 (R transverse process more palpable.       Ambulation/Gait   Gait Comments  slight decrease stance L LE, decreased trunk rotation                Objective measurements completed on examination: See above findings.     Gastric Bypass surgery around 2015   Pt adds that using the back brace helped.   Therapeutic exercise  Prone on elbows 2 min, bilateral femoral IR, with deep breaths (low back extension with exhalation)    Supine bridge 10x to promote glute muscle use and gentle low back extension.   Reviewed HEP. Pt demonstrated and verbalized understanding. Handout provided.   Check hip IR next visit if appropriate  Improved exercise technique, movement at target joints, use of target muscles after min to mod verbal, visual, tactile cues.     Patient is a 32 year old male who came to physical therapy secondary to back pain with radiating symptoms to R hip and thigh. He also presents with altered posture, bilateral hip flexor and extensor weakness, full trunk AROM, slight reproduction of R low back symptoms with overpressure to R side bending, and difficulty performing functional task such as lifting, prolonged walking, and tolerating positions such as prolonged standing. Patient will benefit from skilled physical therapy services to address the aforementioned deficits.        PT Education - 11/28/17 1844    Education Details  ther-ex, HEP, plan of care    Person(s) Educated  Patient    Methods  Explanation;Demonstration;Tactile cues;Verbal cues;Handout    Comprehension  Returned  demonstration;Verbalized understanding  PT Long Term Goals - 11/28/17 1827      PT LONG TERM GOAL #1   Title  Patient will have a decrease in low back and R LE pain to 3/10 or less at worst to promote ability to perform lifting tasks at work.     Baseline  10/10 back and R LE pain at most for the past 3 weeks. (11/28/2017)    Time  5    Period  Weeks    Status  New    Target Date  01/04/18      PT LONG TERM GOAL #2   Title  Patient will improve bilateral glute max strength by at least 1/2 MMT grade to promote ability to perform lifting activities at work more comfortably, as well as ambulate longer distances more comfortably.     Baseline  hip extension: 3+/5 R, 4-/5 L (11/28/2017)    Time  5    Period  Weeks    Status  New    Target Date  01/04/18      PT LONG TERM GOAL #3   Title  Patient will improve his back FOTO score by at least 10 points as a demonstration of improved function.     Baseline  Back FOTO 76 (11/28/2017)    Time  5    Period  Weeks    Status  New    Target Date  01/04/18      PT LONG TERM GOAL #4   Title  Patient will improve his Modified Oswestry Low Back Pain Disability Questionnaire by at least 8% as a demonstration of improved function.     Baseline  16% (11/28/2017)    Time  5    Period  Weeks    Status  New    Target Date  01/04/18             Plan - 11/28/17 1821    Clinical Impression Statement  Patient is a 32 year old male who came to physical therapy secondary to back pain with radiating symptoms to R hip and thigh. He also presents with altered posture, bilateral hip flexor and extensor weakness, full trunk AROM, slight reproduction of R low back symptoms with overpressure to R side bending, and difficulty performing functional task such as lifting, prolonged walking, and tolerating positions such as prolonged standing. Patient will benefit from skilled physical therapy services to address the aforementioned deficits.     History  and Personal Factors relevant to plan of care:  hx of gastric bypass surgery 2015; work activities include consistent lifting 30 lbs - 50 lbs    Clinical Presentation  Stable    Clinical Presentation due to:  Able to continue working in the OR     Clinical Decision Making  Low    Rehab Potential  Good    Clinical Impairments Affecting Rehab Potential  (+) acute condition, motivated, young age    PT Frequency  2x / week    PT Duration  Other (comment) 5 weeks    PT Treatment/Interventions  Therapeutic activities;Therapeutic exercise;Neuromuscular re-education;Patient/family education;Manual techniques;Dry needling;Aquatic Therapy;Electrical Stimulation;Iontophoresis 4mg /ml Dexamethasone;Traction;Ultrasound traction if appropriate    PT Next Visit Plan  core strengthening, hip strengthening, lumbopelvic control, manual techniques, modalities PRN    Consulted and Agree with Plan of Care  Patient       Patient will benefit from skilled therapeutic intervention in order to improve the following deficits and impairments:  Pain, Postural dysfunction, Improper body mechanics, Decreased  strength  Visit Diagnosis: Acute low back pain, unspecified back pain laterality, with sciatica presence unspecified - Plan: PT plan of care cert/re-cert  Radiculopathy, lumbar region - Plan: PT plan of care cert/re-cert  Muscle weakness (generalized) - Plan: PT plan of care cert/re-cert     Problem List Patient Active Problem List   Diagnosis Date Noted  . Essential hypertension 12/30/2016  . Obstructive sleep apnea of adult 12/30/2016  . Gastro-esophageal reflux 12/30/2016  . Vitamin D deficiency 12/30/2016  . Anemia, unspecified 02/22/2016  . Bilateral leg paresthesia 02/22/2016  . Epigastric pain 04/15/2015  . Exocrine pancreatic insufficiency 10/15/2014    Joneen Boers PT, DPT   11/28/2017, 6:51 PM  [place] [facility] [address], [place], [address] Phone: [phone]   Fax:  [phone]  Name: Thomas Shaw MRN: [id number] Date of Birth: 11/24/1985

## 2017-12-05 ENCOUNTER — Ambulatory Visit: Payer: No Typology Code available for payment source

## 2017-12-07 ENCOUNTER — Ambulatory Visit: Payer: No Typology Code available for payment source

## 2017-12-07 DIAGNOSIS — M545 Low back pain: Secondary | ICD-10-CM

## 2017-12-07 DIAGNOSIS — M5416 Radiculopathy, lumbar region: Secondary | ICD-10-CM

## 2017-12-07 DIAGNOSIS — M6281 Muscle weakness (generalized): Secondary | ICD-10-CM

## 2017-12-07 NOTE — Patient Instructions (Addendum)
   Transversus abdominis contraction.    Pretend that there is a string attached from one side of your pelvis to the other.    Tighten that "string."   Hold for 5 seconds.   Perform throughout the day.      Also gave supine hip fallouts 10x3 for 3 sets daily 5 days a week as part of his HEP. Pt demonstrated and verbalized understanding.    Gave sitting with lumbar towel roll as part of HEP. Pt demonstrated and verbalized understanding.

## 2017-12-07 NOTE — Therapy (Signed)
[place] [facility] [address], [place], [address] Phone: [phone]   Fax:  [phone]  Physical Therapy Treatment  Patient Details  Name: Thomas Shaw MRN: [id number] Date of Birth: 11/27/1985 Referring Provider: Allene Dillon, NP   Encounter Date: 12/07/2017  PT End of Session - 12/07/17 1706    Visit Number  2    Number of Visits  11    Date for PT Re-Evaluation  01/04/18    PT Start Time  1706    PT Stop Time  6962    PT Time Calculation (min)  28 min    Activity Tolerance  Patient tolerated treatment well    Behavior During Therapy  [facility] for tasks assessed/performed       Past Medical History:  Diagnosis Date  . Anemia, unspecified 02/22/2016  . Essential hypertension 12/30/2016  . Exocrine pancreatic insufficiency 10/15/2014  . Gastro-esophageal reflux 12/30/2016  . Vitamin D deficiency 12/30/2016    Past Surgical History:  Procedure Laterality Date  . GASTRIC BYPASS      There were no vitals filed for this visit.  Subjective Assessment - 12/07/17 1706    Subjective  Back is doing pretty good. Drove 2 hours to [place] which bothered his back. Was not sitting with lumbar support. No back pain currently. Got a second job at [employer] and starts Monday.     Pertinent History  Low back pain with radiating symptoms R LE. Pt feels pain from R low back to R posterior hip and proximal thigh. When pain occurs, it causes him to straighten out his R LE.  Pain began about 3 months ago. 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 Pain went away but last month, pain returned.  Pt also started working at [employer] 3 months ago. Pt feels like the extra work made his pain retun.   Did a lot of lifting at [facility] and had to stop working at [facility] [employer] [employer] [facility] [employer] [employer] [facility] due to pain.  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 Currently working at the [employer] and doing  ok with it.   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     Patient Stated Goals  Get his core strengthened.     Currently in Pain?  No/denies    Pain Score  0-No pain    Pain Onset  More than a month ago                               PT Education - 12/07/17 1714    Education Details  ther-ex, HEP    Person(s) Educated  Patient    Methods  Explanation;Demonstration;Tactile cues;Verbal cues;Handout    Comprehension  Returned demonstration;Verbalized understanding       Objectives   Pt states using the back brace helped.   Therapeutic exercise  Gave sitting with lumbar towel roll as part of HEP. Pt demonstrated and verbalized understanding.   Supine transversus abdominis contraction 10x2 with 5 second holds  Then with supine hip fallouts 10x, then 3x each LE  Reviewed HEP. Pt demonstrated and verbalized understanding.   Standing low rows for trunk muscle activation resisting green band 10x5 seconds to promote trunk muscle activatoin   Reviewed ergonomic lifting. Pt verbalized understanding.  Improved exercise technique, movement at target joints, use of target muscles after mod verbal, visual, tactile cues.    Worked on core strengthening and lumbopelvic control to help decrease back pain when performing standing tasks. Difficulty with lumbopelvic control with supine hip fallouts with stabilizing L LE > R observed. Given as part of his HEP to help improve. Pt tolerated session well without aggravation of symptoms.        PT Long Term Goals - 11/28/17 1827      PT LONG TERM GOAL #1   Title  Patient will have a decrease in low back and R LE pain to 3/10 or less at worst to promote ability to perform lifting tasks at work.     Baseline  10/10 back and R LE pain at most for the past 3 weeks. (11/28/2017)    Time  5    Period  Weeks    Status  New    Target Date  01/04/18      PT LONG TERM GOAL #2   Title   Patient will improve bilateral glute max strength by at least 1/2 MMT grade to promote ability to perform lifting activities at work more comfortably, as well as ambulate longer distances more comfortably.     Baseline  hip extension: 3+/5 R, 4-/5 L (11/28/2017)    Time  5    Period  Weeks    Status  New    Target Date  01/04/18      PT LONG TERM GOAL #3   Title  Patient will improve his back FOTO score by at least 10 points as a demonstration of improved function.     Baseline  Back FOTO 76 (11/28/2017)    Time  5    Period  Weeks    Status  New    Target Date  01/04/18      PT LONG TERM GOAL #4   Title  Patient will improve his Modified Oswestry Low Back Pain Disability Questionnaire by at least 8% as a demonstration of improved function.     Baseline  16% (11/28/2017)    Time  5    Period  Weeks    Status  New    Target Date  01/04/18            Plan - 12/07/17 1703    Clinical Impression Statement  Worked on core strengthening and lumbopelvic control to help decrease back pain when performing standing tasks. Difficulty with lumbopelvic control with supine hip fallouts with stabilizing L LE > R observed. Given as part of his HEP to help improve. Pt tolerated session well without aggravation of symptoms.    Rehab Potential  Good    Clinical Impairments Affecting Rehab Potential  (+) acute condition, motivated, young age    PT Frequency  2x / week    PT Duration  Other (comment) 5 weeks    PT Treatment/Interventions  Therapeutic activities;Therapeutic exercise;Neuromuscular re-education;Patient/family education;Manual techniques;Dry needling;Aquatic Therapy;Electrical Stimulation;Iontophoresis 4mg /ml Dexamethasone;Traction;Ultrasound traction if appropriate    PT Next Visit Plan  core strengthening, hip strengthening, lumbopelvic control, manual techniques, modalities PRN    Consulted and Agree with Plan of Care  Patient       Patient will benefit from skilled therapeutic  intervention in order to improve the following deficits and impairments:  Pain, Postural dysfunction, Improper body mechanics, Decreased strength  Visit Diagnosis: Acute low back pain, unspecified back pain laterality, with sciatica presence unspecified  Radiculopathy, lumbar region  Muscle weakness (generalized)     Problem List Patient Active Problem List   Diagnosis Date Noted  . Essential hypertension 12/30/2016  . Obstructive sleep apnea of adult 12/30/2016  . Gastro-esophageal reflux 12/30/2016  . Vitamin D deficiency 12/30/2016  . Anemia, unspecified 02/22/2016  . Bilateral leg paresthesia 02/22/2016  . Epigastric pain 04/15/2015  . Exocrine pancreatic insufficiency 10/15/2014    Joneen Boers PT, DPT   12/07/2017, 6:47 PM  [place] [facility] [address], [place], [address] Phone: [phone]   Fax:  [phone]  Name: Thomas Shaw MRN: [id number] Date of Birth: Nov 20, 1985

## 2017-12-12 ENCOUNTER — Telehealth: Payer: Self-pay

## 2017-12-12 ENCOUNTER — Ambulatory Visit: Payer: No Typology Code available for payment source | Attending: Family Medicine

## 2017-12-12 NOTE — Telephone Encounter (Signed)
No show. Called patient and left a message pertaining to appointment and a reminder for the next 2 follow up sessions. Return phone call requested. Phone number [phone]) provided.

## 2017-12-20 ENCOUNTER — Ambulatory Visit: Payer: No Typology Code available for payment source

## 2017-12-26 ENCOUNTER — Ambulatory Visit: Payer: No Typology Code available for payment source

## 2017-12-26 ENCOUNTER — Telehealth: Payer: Self-pay

## 2017-12-26 NOTE — Telephone Encounter (Signed)
No show. Called patient and left a message pertaining to today's appointment. Also informed pt that we have to remove him from the schedule to the no show/cancellation policy but if he wants to schedule another appointment, he can give [place] a call and we'll just schedule one session at a time. Return phone number provided [phone]).

## 2017-12-28 ENCOUNTER — Ambulatory Visit: Payer: No Typology Code available for payment source

## 2018-02-19 DIAGNOSIS — N2581 Secondary hyperparathyroidism of renal origin: Secondary | ICD-10-CM | POA: Insufficient documentation

## 2018-02-21 ENCOUNTER — Encounter: Payer: Self-pay | Admitting: Surgery

## 2018-02-21 ENCOUNTER — Ambulatory Visit (INDEPENDENT_AMBULATORY_CARE_PROVIDER_SITE_OTHER): Payer: No Typology Code available for payment source | Admitting: Surgery

## 2018-02-21 ENCOUNTER — Other Ambulatory Visit: Payer: Self-pay

## 2018-02-21 VITALS — BP 124/72 | HR 77 | Temp 97.9°F | Ht 71.0 in | Wt 186.0 lb

## 2018-02-21 DIAGNOSIS — R140 Abdominal distension (gaseous): Secondary | ICD-10-CM | POA: Diagnosis not present

## 2018-02-21 NOTE — Patient Instructions (Addendum)
Keep appointment with Dr. Allen Norris, contact the office if symptoms get worse. Low-Fat Diet for Pancreatitis or Gallbladder Conditions A low-fat diet can be helpful if you have pancreatitis or a gallbladder condition. With these conditions, your pancreas and gallbladder have trouble digesting fats. A healthy eating plan with less fat will help rest your pancreas and gallbladder and reduce your symptoms. What do I need to know about this diet?  Eat a low-fat diet. ? Reduce your fat intake to less than 20-30% of your total daily calories. This is less than 50-60 g of fat per day. ? Remember that you need some fat in your diet. Ask your dietician what your daily goal should be. ? Choose nonfat and low-fat healthy foods. Look for the words "nonfat," "low fat," or "fat free." ? As a guide, look on the label and choose foods with less than 3 g of fat per serving. Eat only one serving.  Avoid alcohol.  Do not smoke. If you need help quitting, talk with your health care provider.  Eat small frequent meals instead of three large heavy meals. What foods can I eat? Grains Include healthy grains and starches such as potatoes, wheat bread, fiber-rich cereal, and brown rice. Choose whole grain options whenever possible. In adults, whole grains should account for 45-65% of your daily calories. Fruits and Vegetables Eat plenty of fruits and vegetables. Fresh fruits and vegetables add fiber to your diet. Meats and Other Protein Sources Eat lean meat such as chicken and pork. Trim any fat off of meat before cooking it. Eggs, fish, and beans are other sources of protein. In adults, these foods should account for 10-35% of your daily calories. Dairy Choose low-fat milk and dairy options. Dairy includes fat and protein, as well as calcium. Fats and Oils Limit high-fat foods such as fried foods, sweets, baked goods, sugary drinks. Other Creamy sauces and condiments, such as mayonnaise, can add extra fat. Think about  whether or not you need to use them, or use smaller amounts or low fat options. What foods are not recommended?  High fat foods, such as: ? [employer]. ? Ice cream. ? [place] toast. ? Sweet rolls. ? Pizza. ? Cheese bread. ? Foods covered with batter, butter, creamy sauces, or cheese. ? Fried foods. ? Sugary drinks and desserts.  Foods that cause gas or bloating This information is not intended to replace advice given to you by your health care provider. Make sure you discuss any questions you have with your health care provider. Document Released: 06/04/2013 Document Revised: 11/05/2015 Document Reviewed: 05/13/2013 Elsevier Interactive Patient Education  2017 [employer].

## 2018-02-22 ENCOUNTER — Encounter: Payer: Self-pay | Admitting: Surgery

## 2018-02-22 NOTE — Progress Notes (Signed)
02/22/2018  Reason for Visit:  Abdominal pain  Referring Provider:  Frazier Richards, MD  History of Present Illness: Thomas Shaw is a 32 y.o. male who presents for evaluation of abdominal bloating, gas, and diarrhea.  He has a long history of pancreatic insufficiency and takes Creon.  He has not seen his gastroenterologist for about a year and he's gotten a new referral to Dr. Allen Norris for next month.  He reports that after each meal, he has abdominal bloating, foul smelling flatus, and diarrhea.  He has not been able to identify specific foods.  He denies any RUQ abdominal pain, nausea, or vomiting.  Denies any fevers or chills.  The abdominal discomfort that he experiences is more diffuse and related to the bloatedness that he gets.  He had an ultrasound back in 10/2014 which showed sludge in the gallblader and given his symptoms, his PCP repeated his ultrasound in 09/2017.  This showed sludge in the gallbladder as well, without any evidence of cholecystitis and negative Murphy's sign.  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    Past Medical History: Past Medical History:  Diagnosis Date  . Anemia, unspecified 02/22/2016  . Essential hypertension 12/30/2016  . Exocrine pancreatic insufficiency 10/15/2014  . Gastro-esophageal reflux 12/30/2016  . Vitamin D deficiency 12/30/2016     Past Surgical History: Past Surgical History:  Procedure Laterality Date  . BARIATRIC SURGERY  07/31/2013   sleeve gastrectomy with duodenal switch    Home Medications: Prior to Admission medications   Medication Sig Start Date End Date Taking? Authorizing Provider  CREON 36000 units CPEP capsule Take 4 capsules by mouth 4 (four) times daily. 10/12/15  Yes [provider]  EPINEPHrine 0.3 mg/0.3 mL IJ SOAJ injection Inject 0.3 mLs (0.3 mg total) into the muscle once. Follow package instructions as needed for  severe allergy or anaphylactic reaction. 12/08/15  Yes Carrie Mew, MD  Cholecalciferol (D-5000) 5000 units TABS Take by mouth.    [provider]  cyclobenzaprine (FLEXERIL) 10 MG tablet Take 1 tablet (10 mg total) by mouth 3 (three) times daily as needed for muscle spasms. Patient not taking: Reported on 02/21/2018 10/23/17   Scot Jun, FNP  Multiple Vitamin (MULTI-VITAMINS) TABS Take by mouth.    [provider]  oseltamivir (TAMIFLU) 75 MG capsule Take 1 capsule (75 mg total) by mouth every 12 (twelve) hours. Patient not taking: Reported on 10/23/2017 07/17/17   Marylene Land, NP  predniSONE (DELTASONE) 20 MG tablet Take 3 PO QAM x3days, 2 PO QAM x3days, 1 PO QAM x3days Patient not taking: Reported on 02/21/2018 10/23/17   Scot Jun, FNP    Allergies: No Known Allergies  Social History:  reports that he has never smoked. He has never used smokeless tobacco. He reports that he drinks alcohol. He reports that he does not use drugs.   Family History: Family History  Problem Relation Age of Onset  . Pancreatic disease Father   . Diabetes Father   . Diabetes Mother   . Prostate cancer Neg Hx   . Kidney cancer Neg Hx   . Bladder Cancer Neg Hx     Review of Systems: Review of Systems  Constitutional: Negative for chills and fever.  HENT: Negative for hearing loss.   Eyes: Negative for blurred vision.       No icteric sclera  Respiratory: Negative for shortness of breath.  Cardiovascular: Negative for chest pain.  Gastrointestinal: Positive for diarrhea. Negative for blood in stool, nausea and vomiting. Abdominal pain: diffuse abdominal bloating.  Genitourinary: Negative for dysuria.  Musculoskeletal: Negative for myalgias.  Skin: Negative for rash.       No jaundice  Neurological: Negative for dizziness.  Psychiatric/Behavioral: Negative for depression.    Physical Exam BP 124/72   Pulse 77   Temp 97.9 F (36.6 C) (Temporal)   Ht 5'  11" (1.803 m)   Wt 186 lb (84.4 kg)   SpO2 98%   BMI 25.94 kg/m  CONSTITUTIONAL: No acute distress HEENT:  Normocephalic, atraumatic, extraocular motion intact. NECK: Trachea is midline, and there is no jugular venous distension.  RESPIRATORY:  Lungs are clear, and breath sounds are equal bilaterally. Normal respiratory effort without pathologic use of accessory muscles. CARDIOVASCULAR: Heart is regular without murmurs, gallops, or rubs. GI: The abdomen is soft, non-distended, non-tender to palpation.  Negative Murphy's sign.  MUSCULOSKELETAL:  Normal muscle strength and tone in all four extremities.  No peripheral edema or cyanosis. SKIN: Skin turgor is normal. There are no pathologic skin lesions.  NEUROLOGIC:  Motor and sensation is grossly normal.  Cranial nerves are grossly intact. PSYCH:  Alert and oriented to person, place and time. Affect is normal.  Laboratory Analysis: No recent labs.  On 08/14/17, his LFTs were mildly elevated with total bilirubin of 1.1, AST 72, ALT 65, and alk phos 240  Imaging: Ultrasound 09/25/17: IMPRESSION: Small amount of sludge in the gallbladder, less than was seen in 2016. No Murphy sign. No shadowing stone.  Pancreas poorly seen because of overlying bowel gas.  Assessment and Plan: This is a 32 y.o. male who presents with abdominal bloating, foul smelling flatus, and diarrhea after meals.  I have independently viewed the patient's imaging studies and reviewed his laboratory studies.  Overall, his last ultrasound was unremarkable with only sludge.  No recent labs but his last ones on 08/14/17 had mildly elevated LFTs.  Discussed with the patient that his symptoms sound more like issues due to pancreatic insufficiency rather than gallbladder pathology.  Discussed with him the more typical symptoms of biliary colic including RUQ abdominal pain, nausea, and vomiting.  His foul smelling flatus and diarrhea may be due to fat malabsorption which can happen  with pancreatic insufficiency.  Recommended that he keep his appointment with Dr. Allen Norris for further workup.  He should also try a low fat diet.  If at the end it seems that indeed his gallbladder could be the culprit, then he can follow up with [place] to further discuss surgical options.  He understands this plan and is in agreement.  Face-to-face time spent with the patient and care providers was 60 minutes, with more than 50% of the time spent counseling, educating, and coordinating care of the patient.     Melvyn Neth, [facility]

## 2018-04-10 ENCOUNTER — Ambulatory Visit (INDEPENDENT_AMBULATORY_CARE_PROVIDER_SITE_OTHER): Payer: No Typology Code available for payment source | Admitting: Gastroenterology

## 2018-04-10 ENCOUNTER — Encounter: Payer: Self-pay | Admitting: Gastroenterology

## 2018-04-10 VITALS — BP 106/63 | HR 78 | Ht 71.0 in | Wt 169.4 lb

## 2018-04-10 DIAGNOSIS — K9089 Other intestinal malabsorption: Secondary | ICD-10-CM | POA: Diagnosis not present

## 2018-04-10 NOTE — Progress Notes (Signed)
Gastroenterology Consultation  Referring Provider:     Kirk Ruths, MD Primary Care Physician:  Kirk Ruths, MD Primary Gastroenterologist:  Dr. Allen Norris     Reason for Consultation:     Malabsorption        HPI:   Thomas Shaw is a 32 y.o. y/o male referred for consultation & management of malabsorption by Dr. Ouida Sills, Ocie Cornfield, MD.  This patient comes in today after being followed by [facility] for diarrhea.  The patient was diagnosed with exocrine pancreatic insufficiency and has been treated with Creon.  The patient states he takes 6 Creon tablets each being 36,000 and units with a meal.  The patient reports that if he decreases the amount of Creon he takes he gets worse diarrhea and bloating.  He reports that his symptoms started after having gastric surgery for weight loss.  The patient has come from 370 pounds down to 170 pounds now.  Shortly after having the surgery he states his malabsorption problem started.  The patient now wants to switch to somebody closer than going to [facility] for his GI care. He reports that the most bothersome thing about his malabsorption is his foul-smelling stools.  He states that they are a constant embarrassment.  Past Medical History:  Diagnosis Date  . Anemia, unspecified 02/22/2016  . Essential hypertension 12/30/2016  . Exocrine pancreatic insufficiency 10/15/2014  . Gastro-esophageal reflux 12/30/2016  . Vitamin D deficiency 12/30/2016    Past Surgical History:  Procedure Laterality Date  . BARIATRIC SURGERY  07/31/2013   sleeve gastrectomy with duodenal switch    Prior to Admission medications   Medication Sig Start Date End Date Taking? Authorizing Provider  Cholecalciferol (D-5000) 5000 units TABS Take by mouth.   Yes [provider]  CREON 36000 units CPEP capsule Take 4 capsules by mouth 4 (four) times daily. 10/12/15  Yes [provider]  Multiple Vitamin (MULTI-VITAMINS) TABS Take by  mouth.   Yes [provider]  cyclobenzaprine (FLEXERIL) 10 MG tablet Take 1 tablet (10 mg total) by mouth 3 (three) times daily as needed for muscle spasms. Patient not taking: Reported on 02/21/2018 10/23/17   Scot Jun, FNP  EPINEPHrine 0.3 mg/0.3 mL IJ SOAJ injection Inject 0.3 mLs (0.3 mg total) into the muscle once. Follow package instructions as needed for severe allergy or anaphylactic reaction. Patient not taking: Reported on 04/10/2018 12/08/15   Carrie Mew, MD  oseltamivir (TAMIFLU) 75 MG capsule Take 1 capsule (75 mg total) by mouth every 12 (twelve) hours. Patient not taking: Reported on 10/23/2017 07/17/17   Marylene Land, NP  predniSONE (DELTASONE) 20 MG tablet Take 3 PO QAM x3days, 2 PO QAM x3days, 1 PO QAM x3days Patient not taking: Reported on 02/21/2018 10/23/17   Scot Jun, FNP    Family History  Problem Relation Age of Onset  . Pancreatic disease Father   . Diabetes Father   . Diabetes Mother   . Prostate cancer Neg Hx   . Kidney cancer Neg Hx   . Bladder Cancer Neg Hx      Social History   Tobacco Use  . Smoking status: Never Smoker  . Smokeless tobacco: Never Used  Substance Use Topics  . Alcohol use: Yes    Frequency: Never    Comment: rarely  . Drug use: No    Allergies as of 04/10/2018  . (No Known Allergies)    Review of Systems:    All systems  reviewed and negative except where noted in HPI.   Physical Exam:  BP 106/63   Pulse 78   Ht 5\' 11"  (1.803 m)   Wt 169 lb 6.4 oz (76.8 kg)   BMI 23.63 kg/m  No LMP for male patient. General:   Alert,  Well-developed, well-nourished, pleasant and cooperative in NAD Head:  Normocephalic and atraumatic. Eyes:  Sclera clear, no icterus.   Conjunctiva pink. Ears:  Normal auditory acuity. Nose:  No deformity, discharge, or lesions. Mouth:  No deformity or lesions,oropharynx pink & moist. Neck:  Supple; no masses or thyromegaly. Lungs:  Respirations even and unlabored.   Clear throughout to auscultation.   No wheezes, crackles, or rhonchi. No acute distress. Heart:  Regular rate and rhythm; no murmurs, clicks, rubs, or gallops. Abdomen:  Normal bowel sounds.  No bruits.  Soft, non-tender and non-distended without masses, hepatosplenomegaly or hernias noted.  No guarding or rebound tenderness.  Negative Carnett sign.   Rectal:  Deferred.  Msk:  Symmetrical without gross deformities.  Good, equal movement & strength bilaterally. Pulses:  Normal pulses noted. Extremities:  No clubbing or edema.  No cyanosis. Neurologic:  Alert and oriented x3;  grossly normal neurologically. Skin:  Intact without significant lesions or rashes.  No jaundice. Lymph Nodes:  No significant cervical adenopathy. Psych:  Alert and cooperative. Normal mood and affect.  Imaging Studies: No results found.  Assessment and Plan:   Thomas Shaw is a 32 y.o. y/o male who has been diagnosed in the past with exocrine pancreatic insufficiency who has been taking 6 tablets of 36,000 units of Creon with each meal and still has symptoms.  The patient has been given samples of Xifaxan for treatment of possible bacterial overgrowth.  The patient will take the medication for the next 2 weeks and if at the end of the 2 weeks he continues to have symptoms then his Creon may need to be changed to something that works better for him but if he feels better on the Xifaxan he will be continued on it for another 2 weeks to treat the bacterial overgrowth.  The patient has been explained the plan and agrees with it.  Lucilla Lame, MD. Marval Regal    Note: This dictation was prepared with Dragon dictation along with smaller phrase technology. Any transcriptional errors that result from this process are unintentional.

## 2018-04-24 ENCOUNTER — Telehealth: Payer: Self-pay | Admitting: Gastroenterology

## 2018-04-24 ENCOUNTER — Other Ambulatory Visit: Payer: Self-pay

## 2018-04-24 MED ORDER — RIFAXIMIN 550 MG PO TABS: 550.0000 mg | ORAL_TABLET | Freq: Two times a day (BID) | ORAL | 3 refills | Status: DC

## 2018-04-24 NOTE — Telephone Encounter (Signed)
Pt notified the Xifaxan has been sent to the [facility] outpatient pharmacy per his request.

## 2018-04-24 NOTE — Telephone Encounter (Signed)
Pt is calling to get refill on rx xifaxan send to [facility]

## 2018-07-03 ENCOUNTER — Ambulatory Visit (INDEPENDENT_AMBULATORY_CARE_PROVIDER_SITE_OTHER): Payer: No Typology Code available for payment source | Admitting: Internal Medicine

## 2018-07-03 ENCOUNTER — Encounter: Payer: Self-pay | Admitting: Internal Medicine

## 2018-07-03 VITALS — BP 118/72 | HR 97 | Temp 98.1°F | Ht 71.0 in | Wt 177.2 lb

## 2018-07-03 DIAGNOSIS — E559 Vitamin D deficiency, unspecified: Secondary | ICD-10-CM

## 2018-07-03 DIAGNOSIS — R197 Diarrhea, unspecified: Secondary | ICD-10-CM

## 2018-07-03 DIAGNOSIS — Z1159 Encounter for screening for other viral diseases: Secondary | ICD-10-CM

## 2018-07-03 DIAGNOSIS — Z0184 Encounter for antibody response examination: Secondary | ICD-10-CM

## 2018-07-03 DIAGNOSIS — K529 Noninfective gastroenteritis and colitis, unspecified: Secondary | ICD-10-CM

## 2018-07-03 DIAGNOSIS — E538 Deficiency of other specified B group vitamins: Secondary | ICD-10-CM

## 2018-07-03 DIAGNOSIS — K921 Melena: Secondary | ICD-10-CM

## 2018-07-03 DIAGNOSIS — E611 Iron deficiency: Secondary | ICD-10-CM | POA: Diagnosis not present

## 2018-07-03 DIAGNOSIS — Z1329 Encounter for screening for other suspected endocrine disorder: Secondary | ICD-10-CM | POA: Diagnosis not present

## 2018-07-03 DIAGNOSIS — K861 Other chronic pancreatitis: Secondary | ICD-10-CM | POA: Diagnosis not present

## 2018-07-03 DIAGNOSIS — R109 Unspecified abdominal pain: Secondary | ICD-10-CM

## 2018-07-03 DIAGNOSIS — Z9889 Other specified postprocedural states: Secondary | ICD-10-CM

## 2018-07-03 DIAGNOSIS — D649 Anemia, unspecified: Secondary | ICD-10-CM

## 2018-07-03 DIAGNOSIS — K909 Intestinal malabsorption, unspecified: Secondary | ICD-10-CM

## 2018-07-03 DIAGNOSIS — K4090 Unilateral inguinal hernia, without obstruction or gangrene, not specified as recurrent: Secondary | ICD-10-CM

## 2018-07-03 DIAGNOSIS — K8681 Exocrine pancreatic insufficiency: Secondary | ICD-10-CM

## 2018-07-03 NOTE — Progress Notes (Signed)
Pre visit review using our clinic review tool, if applicable. No additional management support is needed unless otherwise documented below in the visit note. 

## 2018-07-03 NOTE — Patient Instructions (Addendum)
Call insurance and figure out which GI doctor in network in [place] or [place]    Iron Deficiency Anemia, Adult Iron deficiency anemia is a condition in which the concentration of red blood cells or hemoglobin in the blood is below normal because of too little iron. Hemoglobin is a substance in red blood cells that carries oxygen to the body's tissues. When the concentration of red blood cells or hemoglobin is too low, not enough oxygen reaches these tissues. Iron deficiency anemia is usually long-lasting (chronic) and it develops over time. It may or may not cause symptoms. It is a common type of anemia. What are the causes? This condition may be caused by:  Not enough iron in the diet.  Blood loss caused by bleeding in the intestine.  Blood loss from a gastrointestinal condition like Crohn disease.  Frequent blood draws, such as from blood donation.  Abnormal absorption in the gut.  Heavy menstrual periods in women.  Cancers of the gastrointestinal system, such as colon cancer. What are the signs or symptoms? Symptoms of this condition may include:  Fatigue.  Headache.  Pale skin, lips, and nail beds.  Poor appetite.  Weakness.  Shortness of breath.  Dizziness.  Cold hands and feet.  Fast or irregular heartbeat.  Irritability. This is more common in severe anemia.  Rapid breathing. This is more common in severe anemia. Mild anemia may not cause any symptoms. How is this diagnosed? This condition is diagnosed based on:  Your medical history.  A physical exam.  Blood tests. You may have additional tests to find the underlying cause of your anemia, such as:  Testing for blood in the stool (fecal occult blood test).  A procedure to see inside your colon and rectum (colonoscopy).  A procedure to see inside your esophagus and stomach (endoscopy).  A test in which cells are removed from bone marrow (bone marrow aspiration) or fluid is removed from the bone  marrow to be examined (biopsy). This is rarely needed. How is this treated? This condition is treated by correcting the cause of your iron deficiency. Treatment may involve:  Adding iron-rich foods to your diet.  Taking iron supplements. If you are pregnant or breastfeeding, you may need to take extra iron because your normal diet usually does not provide the amount of iron that you need.  Increasing vitamin C intake. Vitamin C helps your body absorb iron. Your health care provider may recommend that you take iron supplements along with a glass of orange juice or a vitamin C supplement.  Medicines to make heavy menstrual flow lighter.  Surgery. You may need repeat blood tests to determine whether treatment is working. Depending on the underlying cause, the anemia should be corrected within 2 months of starting treatment. If the treatment does not seem to be working, you may need more testing. Follow these instructions at home: Medicines  Take over-the-counter and prescription medicines only as told by your health care provider. This includes iron supplements and vitamins.  If you cannot tolerate taking iron supplements by mouth, talk with your health care provider about taking them through a vein (intravenously) or an injection into a muscle.  For the best iron absorption, you should take iron supplements when your stomach is empty. If you cannot tolerate them on an empty stomach, you may need to take them with food.  Do not drink milk or take antacids at the same time as your iron supplements. Milk and antacids may interfere with iron  absorption.  Iron supplements can cause constipation. To prevent constipation, include fiber in your diet as told by your health care provider. A stool softener may also be recommended. Eating and drinking   Talk with your health care provider before changing your diet. He or she may recommend that you eat foods that contain a lot of iron, such  as: ? Liver. ? Low-fat (lean) beef. ? Breads and cereals that have iron added to them (are fortified). ? Eggs. ? Dried fruit. ? Dark green, leafy vegetables.  To help your body use the iron from iron-rich foods, eat those foods at the same time as fresh fruits and vegetables that are high in vitamin C. Foods that are high in vitamin C include: ? Oranges. ? Peppers. ? Tomatoes. ? Mangoes.  Drinkenoughfluid to keep your urine clear or pale yellow. General instructions  Return to your normal activities as told by your health care provider. Ask your health care provider what activities are safe for you.  Practice good hygiene. Anemia can make you more prone to illness and infection.  Keep all follow-up visits as told by your health care provider. This is important. Contact a health care provider if:  You feel nauseous or you vomit.  You feel weak.  You have unexplained sweating.  You develop symptoms of constipation, such as: ? Having fewer than three bowel movements a week. ? Straining to have a bowel movement. ? Having stools that are hard, dry, or larger than normal. ? Feeling full or bloated. ? Pain in the lower abdomen. ? Not feeling relief after having a bowel movement. Get help right away if:  You faint. If this happens, do not drive yourself to the hospital. Call your local emergency services (911 in the [place]  You have chest pain.  You have shortness of breath that: ? Is severe. ? Gets worse with physical activity.  You have a rapid heartbeat.  You become light-headed when getting up from a sitting or lying down position. This information is not intended to replace advice given to you by your health care provider. Make sure you discuss any questions you have with your health care provider. Document Released: 05/27/2000 Document Revised: 02/17/2016 Document Reviewed: 02/17/2016 Elsevier Interactive Patient Education  2019 [employer].

## 2018-07-04 ENCOUNTER — Encounter: Payer: Self-pay | Admitting: Internal Medicine

## 2018-07-04 DIAGNOSIS — E611 Iron deficiency: Secondary | ICD-10-CM | POA: Insufficient documentation

## 2018-07-04 DIAGNOSIS — K529 Noninfective gastroenteritis and colitis, unspecified: Secondary | ICD-10-CM | POA: Insufficient documentation

## 2018-07-04 DIAGNOSIS — K4090 Unilateral inguinal hernia, without obstruction or gangrene, not specified as recurrent: Secondary | ICD-10-CM | POA: Insufficient documentation

## 2018-07-04 DIAGNOSIS — Z9889 Other specified postprocedural states: Secondary | ICD-10-CM | POA: Insufficient documentation

## 2018-07-04 DIAGNOSIS — K921 Melena: Secondary | ICD-10-CM | POA: Insufficient documentation

## 2018-07-04 HISTORY — DX: Iron deficiency: E61.1

## 2018-07-04 LAB — CBC WITH DIFFERENTIAL/PLATELET
BASOS ABS: 0.1 10*3/uL (ref 0.0–0.1)
Basophils Relative: 0.8 % (ref 0.0–3.0)
EOS ABS: 0.1 10*3/uL (ref 0.0–0.7)
Eosinophils Relative: 0.8 % (ref 0.0–5.0)
HEMATOCRIT: 33.2 % — AB (ref 39.0–52.0)
HEMOGLOBIN: 10.8 g/dL — AB (ref 13.0–17.0)
LYMPHS PCT: 38.6 % (ref 12.0–46.0)
Lymphs Abs: 2.5 10*3/uL (ref 0.7–4.0)
MCHC: 32.5 g/dL (ref 30.0–36.0)
MCV: 90.9 fl (ref 78.0–100.0)
Monocytes Absolute: 0.4 10*3/uL (ref 0.1–1.0)
Monocytes Relative: 5.5 % (ref 3.0–12.0)
Neutro Abs: 3.5 10*3/uL (ref 1.4–7.7)
Neutrophils Relative %: 54.3 % (ref 43.0–77.0)
PLATELETS: 216 10*3/uL (ref 150.0–400.0)
RBC: 3.65 Mil/uL — ABNORMAL LOW (ref 4.22–5.81)
RDW: 15.1 % (ref 11.5–15.5)
WBC: 6.5 10*3/uL (ref 4.0–10.5)

## 2018-07-04 LAB — URINALYSIS, ROUTINE W REFLEX MICROSCOPIC
Bilirubin Urine: NEGATIVE
Glucose, UA: NEGATIVE
Hgb urine dipstick: NEGATIVE
Ketones, ur: NEGATIVE
Leukocytes, UA: NEGATIVE
NITRITE: NEGATIVE
Protein, ur: NEGATIVE
Specific Gravity, Urine: 1.02 (ref 1.001–1.03)
pH: 6 (ref 5.0–8.0)

## 2018-07-04 LAB — IRON,TIBC AND FERRITIN PANEL
%SAT: 23 % (calc) (ref 20–48)
Ferritin: 332 ng/mL (ref 38–380)
Iron: 78 ug/dL (ref 50–180)
TIBC: 337 mcg/dL (calc) (ref 250–425)

## 2018-07-04 LAB — COMPREHENSIVE METABOLIC PANEL
ALBUMIN: 4.7 g/dL (ref 3.5–5.2)
ALT: 59 U/L — AB (ref 0–53)
AST: 49 U/L — AB (ref 0–37)
Alkaline Phosphatase: 205 U/L — ABNORMAL HIGH (ref 39–117)
BILIRUBIN TOTAL: 1.2 mg/dL (ref 0.2–1.2)
BUN: 19 mg/dL (ref 6–23)
CALCIUM: 9.3 mg/dL (ref 8.4–10.5)
CO2: 23 mEq/L (ref 19–32)
CREATININE: 1.04 mg/dL (ref 0.40–1.50)
Chloride: 107 mEq/L (ref 96–112)
GFR: 100.73 mL/min (ref >60.00)
Glucose, Bld: 78 mg/dL (ref 70–99)
Potassium: 3.9 mEq/L (ref 3.5–5.1)
Sodium: 138 mEq/L (ref 135–145)
Total Protein: 7.7 g/dL (ref 6.0–8.3)

## 2018-07-04 LAB — MEASLES/MUMPS/RUBELLA IMMUNITY
Mumps IgG: 93.5 AU/mL
Rubella: 5.75 index
Rubeola IgG: 84 AU/mL

## 2018-07-04 LAB — LIPASE: Lipase: 65 U/L — ABNORMAL HIGH (ref 11.0–59.0)

## 2018-07-04 LAB — VITAMIN D 25 HYDROXY (VIT D DEFICIENCY, FRACTURES): VITD: 13.7 ng/mL — ABNORMAL LOW (ref 30.00–100.00)

## 2018-07-04 LAB — HEPATITIS B SURFACE ANTIBODY, QUANTITATIVE: Hep B S AB Quant (Post): 6 m[IU]/mL — ABNORMAL LOW (ref >=10)

## 2018-07-04 LAB — T4, FREE: Free T4: 0.77 ng/dL (ref 0.60–1.60)

## 2018-07-04 LAB — TSH: TSH: 3.29 u[IU]/mL (ref 0.35–4.50)

## 2018-07-04 NOTE — Progress Notes (Signed)
Chief Complaint  Patient presents with  . Follow-up   New patient  1 s/p gastric sleeve and duodenal switch in 2015 with Dr. Duke Salvia with previous imaging c/w pancreatitis insuff./pancreatitic insuff. Since surgery, has blood in stool and malabsorptive diarrhea and stool incontinence, iron def anemia and vitamin D def  2. Iron def anemia and blood in stool week of 1/5-1/03/2019 multiple episodes last 06/24/18 BRBPR in toilet. H/H was 8.6/27.1 06/25/2018  3. Right inguinal/groin hernia at times painful in scrotum ? Side   Review of Systems  Constitutional: Positive for weight loss.  HENT: Negative for hearing loss.   Eyes: Negative for blurred vision.  Respiratory: Negative for shortness of breath.   Cardiovascular: Negative for chest pain.  Gastrointestinal: Negative for abdominal pain.  Musculoskeletal: Negative for falls.  Skin: Negative for rash.  Neurological: Negative for headaches.  Psychiatric/Behavioral: Negative for depression.   Past Medical History:  Diagnosis Date  . Anemia, unspecified 02/22/2016  . Essential hypertension 12/30/2016  . Exocrine pancreatic insufficiency 10/15/2014  . Gastro-esophageal reflux 12/30/2016  . Vitamin D deficiency 12/30/2016   Past Surgical History:  Procedure Laterality Date  . BARIATRIC SURGERY  07/31/2013   sleeve gastrectomy with duodenal switch   Family History  Problem Relation Age of Onset  . Pancreatic disease Father   . Diabetes Father   . Diabetes Mother   . Prostate cancer Neg Hx   . Kidney cancer Neg Hx   . Bladder Cancer Neg Hx    Social History   Socioeconomic History  . Marital status: Married    Spouse name: Not on file  . Number of children: Not on file  . Years of education: Not on file  . Highest education level: Not on file  Occupational History  . Not on file  Social Needs  . Financial resource strain: Not on file  . Food insecurity:    Worry: Not on file    Inability: Not on file  . Transportation needs:    Medical: Not on file    Non-medical: Not on file  Tobacco Use  . Smoking status: Never Smoker  . Smokeless tobacco: Never Used  Substance and Sexual Activity  . Alcohol use: Yes    Frequency: Never    Comment: rarely  . Drug use: No  . Sexual activity: Not on file  Lifestyle  . Physical activity:    Days per week: Not on file    Minutes per session: Not on file  . Stress: Not on file  Relationships  . Social connections:    Talks on phone: Not on file    Gets together: Not on file    Attends religious service: Not on file    Active member of club or organization: Not on file    Attends meetings of clubs or organizations: Not on file    Relationship status: Not on file  . Intimate partner violence:    Fear of current or ex partner: Not on file    Emotionally abused: Not on file    Physically abused: Not on file    Forced sexual activity: Not on file  Other Topics Concern  . Not on file  Social History Narrative  . Not on file   Current Meds  Medication Sig  . calcium carbonate (OS-CAL) 1250 (500 Ca) MG chewable tablet Chew by mouth.  . Cholecalciferol (D-5000) 5000 units TABS Take by mouth.  . CREON 36000 units CPEP capsule Take 4 capsules by mouth 4 (  four) times daily.  Marland Kitchen EPINEPHrine 0.3 mg/0.3 mL IJ SOAJ injection Inject 0.3 mLs (0.3 mg total) into the muscle once. Follow package instructions as needed for severe allergy or anaphylactic reaction.  . Ferrous Sulfate 134 MG TABS Take by mouth.  . Multiple Vitamin (MULTI-VITAMINS) TABS Take by mouth.  . triamterene-hydrochlorothiazide (MAXZIDE) 75-50 MG tablet Take 1 tablet by mouth daily.   No Known Allergies Recent Results (from the past 2160 hour(s))  Iron, TIBC and Ferritin Panel     Status: None   Collection Time: 07/03/18  3:37 PM  Result Value Ref Range   Iron 78 50 - 180 mcg/dL   TIBC 337 250 - 425 mcg/dL (calc)   %SAT 23 20 - 48 % (calc)   Ferritin 332 38 - 380 ng/mL  Urinalysis, Routine w reflex  microscopic     Status: None   Collection Time: 07/03/18  3:37 PM  Result Value Ref Range   Color, Urine YELLOW YELLOW   APPearance CLEAR CLEAR   Specific Gravity, Urine 1.020 1.001 - 1.03   pH 6.0 5.0 - 8.0   Glucose, UA NEGATIVE NEGATIVE   Bilirubin Urine NEGATIVE NEGATIVE   Ketones, ur NEGATIVE NEGATIVE   Hgb urine dipstick NEGATIVE NEGATIVE   Protein, ur NEGATIVE NEGATIVE   Nitrite NEGATIVE NEGATIVE   Leukocytes, UA NEGATIVE NEGATIVE   Objective  Body mass index is 24.71 kg/m. Wt Readings from Last 3 Encounters:  07/03/18 177 lb 3.2 oz (80.4 kg)  04/10/18 169 lb 6.4 oz (76.8 kg)  02/21/18 186 lb (84.4 kg)   Temp Readings from Last 3 Encounters:  07/03/18 98.1 F (36.7 C) (Oral)  02/21/18 97.9 F (36.6 C) (Temporal)  10/23/17 98.5 F (36.9 C)   BP Readings from Last 3 Encounters:  07/03/18 118/72  04/10/18 106/63  02/21/18 124/72   Pulse Readings from Last 3 Encounters:  07/03/18 97  04/10/18 78  02/21/18 77    Physical Exam Vitals signs and nursing note reviewed.  Constitutional:      Appearance: Normal appearance. He is well-developed and well-groomed.  HENT:     Head: Normocephalic and atraumatic.     Nose: Nose normal.     Mouth/Throat:     Mouth: Mucous membranes are moist.     Pharynx: Oropharynx is clear.  Eyes:     Conjunctiva/sclera: Conjunctivae normal.     Pupils: Pupils are equal, round, and reactive to light.  Cardiovascular:     Rate and Rhythm: Normal rate and regular rhythm.     Pulses: Normal pulses.     Heart sounds: Normal heart sounds.  Pulmonary:     Effort: Pulmonary effort is normal.     Breath sounds: Normal breath sounds.  Abdominal:    Skin:    General: Skin is warm and dry.  Neurological:     General: No focal deficit present.     Mental Status: He is alert and oriented to person, place, and time.     Gait: Gait normal.  Psychiatric:        Attention and Perception: Attention and perception normal.        Mood and  Affect: Mood and affect normal.        Speech: Speech normal.        Behavior: Behavior normal. Behavior is cooperative.        Thought Content: Thought content normal.        Cognition and Memory: Cognition and memory normal.  Judgment: Judgment normal.     Assessment   1 s/p gastric sleeve and duodenal switch in 2015 with Dr. Duke Salvia with previous imaging c/w pancreatitis insuff./pancreatitic insuff., has blood in stool and malabsorptive diarrhea and stool incontinence, iron def anemia and vitamin D def. Of note C diff neg 06/28/2018 for reason for diarrhea  2. Iron def anemia and blood in stool week of 1/5-1/03/2019 multiple episodes last 06/24/18 BRBPR in toilet. H/H was 8.6/27.1 06/25/2018  3. Right inguinal/groin hernia  4. HM Plan  1.  Will check labs today  Refer to GI Dr. Allen Norris and bariatric surgery Dr Darnell Level Rifaximin caused leg swelling in the past  On Creon see med list  2. consider Refer to H/o  Check labs today  Needs EGD and colonoscopy  3. Refer to Dr. Darnell Level to consider repair  4.  Flu shot utd  Consider Tdap in future  Hep C neg 09/19/17  HIV neg 02/14/17  Provider: Dr. Olivia Mackie [facility]

## 2018-07-05 ENCOUNTER — Other Ambulatory Visit: Payer: Self-pay | Admitting: Internal Medicine

## 2018-07-05 DIAGNOSIS — E559 Vitamin D deficiency, unspecified: Secondary | ICD-10-CM

## 2018-07-05 LAB — VITAMIN B12: Vitamin B-12: 899 pg/mL (ref 211–911)

## 2018-07-05 MED ORDER — CHOLECALCIFEROL 1.25 MG (50000 UT) PO CAPS: 50000.0000 [IU] | ORAL_CAPSULE | ORAL | 1 refills | Status: DC

## 2018-07-19 ENCOUNTER — Encounter: Payer: Self-pay | Admitting: Internal Medicine

## 2018-07-19 ENCOUNTER — Ambulatory Visit (INDEPENDENT_AMBULATORY_CARE_PROVIDER_SITE_OTHER): Payer: No Typology Code available for payment source | Admitting: Internal Medicine

## 2018-07-19 VITALS — BP 130/70 | HR 101 | Temp 98.2°F | Ht 71.0 in | Wt 166.0 lb

## 2018-07-19 DIAGNOSIS — K30 Functional dyspepsia: Secondary | ICD-10-CM

## 2018-07-19 DIAGNOSIS — E559 Vitamin D deficiency, unspecified: Secondary | ICD-10-CM | POA: Diagnosis not present

## 2018-07-19 DIAGNOSIS — R945 Abnormal results of liver function studies: Secondary | ICD-10-CM | POA: Diagnosis not present

## 2018-07-19 DIAGNOSIS — R7989 Other specified abnormal findings of blood chemistry: Secondary | ICD-10-CM

## 2018-07-19 DIAGNOSIS — K4090 Unilateral inguinal hernia, without obstruction or gangrene, not specified as recurrent: Secondary | ICD-10-CM | POA: Diagnosis not present

## 2018-07-19 DIAGNOSIS — K8689 Other specified diseases of pancreas: Secondary | ICD-10-CM

## 2018-07-19 DIAGNOSIS — Z9884 Bariatric surgery status: Secondary | ICD-10-CM

## 2018-07-19 DIAGNOSIS — K529 Noninfective gastroenteritis and colitis, unspecified: Secondary | ICD-10-CM

## 2018-07-19 DIAGNOSIS — R42 Dizziness and giddiness: Secondary | ICD-10-CM

## 2018-07-19 DIAGNOSIS — K649 Unspecified hemorrhoids: Secondary | ICD-10-CM

## 2018-07-19 MED ORDER — CHOLECALCIFEROL 1.25 MG (50000 UT) PO CAPS: 50000.0000 [IU] | ORAL_CAPSULE | ORAL | 1 refills | Status: DC

## 2018-07-19 NOTE — Patient Instructions (Addendum)
Consider CT abdomen/pelvis w/u hernia and elevated liver enzymes   Call Dr. Alphonsa Overall or Dr. Johnathan Hausen [facility] surgery   Tucks/Preparation H pads  Warm soaks in the tub   Hemorrhoids Hemorrhoids are swollen veins in and around the rectum or anus. There are two types of hemorrhoids:  Internal hemorrhoids. These occur in the veins that are just inside the rectum. They may poke through to the outside and become irritated and painful.  External hemorrhoids. These occur in the veins that are outside the anus and can be felt as a painful swelling or hard lump near the anus. Most hemorrhoids do not cause serious problems, and they can be managed with home treatments such as diet and lifestyle changes. If home treatments do not help the symptoms, procedures can be done to shrink or remove the hemorrhoids. What are the causes? This condition is caused by increased pressure in the anal area. This pressure may result from various things, including:  Constipation.  Straining to have a bowel movement.  Diarrhea.  Pregnancy.  Obesity.  Sitting for long periods of time.  Heavy lifting or other activity that causes you to strain.  Anal sex.  Riding a bike for a long period of time. What are the signs or symptoms? Symptoms of this condition include:  Pain.  Anal itching or irritation.  Rectal bleeding.  Leakage of stool (feces).  Anal swelling.  One or more lumps around the anus. How is this diagnosed? This condition can often be diagnosed through a visual exam. Other exams or tests may also be done, such as:  An exam that involves feeling the rectal area with a gloved hand (digital rectal exam).  An exam of the anal canal that is done using a small tube (anoscope).  A blood test, if you have lost a significant amount of blood.  A test to look inside the colon using a flexible tube with a camera on the end (sigmoidoscopy or colonoscopy). How is this  treated? This condition can usually be treated at home. However, various procedures may be done if dietary changes, lifestyle changes, and other home treatments do not help your symptoms. These procedures can help make the hemorrhoids smaller or remove them completely. Some of these procedures involve surgery, and others do not. Common procedures include:  Rubber band ligation. Rubber bands are placed at the base of the hemorrhoids to cut off their blood supply.  Sclerotherapy. Medicine is injected into the hemorrhoids to shrink them.  Infrared coagulation. A type of light energy is used to get rid of the hemorrhoids.  Hemorrhoidectomy surgery. The hemorrhoids are surgically removed, and the veins that supply them are tied off.  Stapled hemorrhoidopexy surgery. The surgeon staples the base of the hemorrhoid to the rectal wall. Follow these instructions at home: Eating and drinking   Eat foods that have a lot of fiber in them, such as whole grains, beans, nuts, fruits, and vegetables.  Ask your health care provider about taking products that have added fiber (fiber supplements).  Reduce the amount of fat in your diet. You can do this by eating low-fat dairy products, eating less red meat, and avoiding processed foods.  Drink enough fluid to keep your urine pale yellow. Managing pain and swelling   Take warm sitz baths for 20 minutes, 3-4 times a day to ease pain and discomfort. You may do this in a bathtub or using a portable sitz bath that fits over the toilet.  If  directed, apply ice to the affected area. Using ice packs between sitz baths may be helpful. ? Put ice in a plastic bag. ? Place a towel between your skin and the bag. ? Leave the ice on for 20 minutes, 2-3 times a day. General instructions  Take over-the-counter and prescription medicines only as told by your health care provider.  Use medicated creams or suppositories as told.  Get regular exercise. Ask your health  care provider how much and what kind of exercise is best for you. In general, you should do moderate exercise for at least 30 minutes on most days of the week (150 minutes each week). This can include activities such as walking, biking, or yoga.  Go to the bathroom when you have the urge to have a bowel movement. Do not wait.  Avoid straining to have bowel movements.  Keep the anal area dry and clean. Use wet toilet paper or moist towelettes after a bowel movement.  Do not sit on the toilet for long periods of time. This increases blood pooling and pain.  Keep all follow-up visits as told by your health care provider. This is important. Contact a health care provider if you have:  Increasing pain and swelling that are not controlled by treatment or medicine.  Difficulty having a bowel movement, or you are unable to have a bowel movement.  Pain or inflammation outside the area of the hemorrhoids. Get help right away if you have:  Uncontrolled bleeding from your rectum. Summary  Hemorrhoids are swollen veins in and around the rectum or anus.  Most hemorrhoids can be managed with home treatments such as diet and lifestyle changes.  Taking warm sitz baths can help ease pain and discomfort.  In severe cases, procedures or surgery can be done to shrink or remove the hemorrhoids. This information is not intended to replace advice given to you by your health care provider. Make sure you discuss any questions you have with your health care provider. Document Released: 05/27/2000 Document Revised: 10/19/2017 Document Reviewed: 10/19/2017 Elsevier Interactive Patient Education  2019 [employer]  Inguinal Hernia, Adult An inguinal hernia develops when fat or the intestines push through a weak spot in a muscle where your leg meets your lower abdomen (groin). This creates a bulge. This kind of hernia could also be:  In your scrotum, if you are male.  In folds of skin around your vagina,  if you are male. There are three types of inguinal hernias:  Hernias that can be pushed back into the abdomen (are reducible). This type rarely causes pain.  Hernias that are not reducible (are incarcerated).  Hernias that are not reducible and lose their blood supply (are strangulated). This type of hernia requires emergency surgery. What are the causes? This condition is caused by having a weak spot in the muscles or tissues in the groin. This weak spot develops over time. The hernia may poke through the weak spot when you suddenly strain your lower abdominal muscles, such as when you:  Lift a heavy object.  Strain to have a bowel movement. Constipation can lead to straining.  Cough. What increases the risk? This condition is more likely to develop in:  Men.  Pregnant women.  People who: ? Are overweight. ? Work in jobs that require long periods of standing or heavy lifting. ? Have had an inguinal hernia before. ? Smoke or have lung disease. These factors can lead to long-lasting (chronic) coughing. What are the signs or  symptoms? Symptoms may depend on the size of the hernia. Often, a small inguinal hernia has no symptoms. Symptoms of a larger hernia may include:  A lump in the groin area. This is easier to see when standing. It might not be visible when lying down.  Pain or burning in the groin. This may get worse when lifting, straining, or coughing.  A dull ache or a feeling of pressure in the groin.  In men, an unusual lump in the scrotum. Symptoms of a strangulated inguinal hernia may include:  A bulge in your groin that is very painful and tender to the touch.  A bulge that turns red or purple.  Fever, nausea, and vomiting.  Inability to have a bowel movement or to pass gas. How is this diagnosed? This condition is diagnosed based on your symptoms, your medical history, and a physical exam. Your health care provider may feel your groin area and ask you to  cough. How is this treated? Treatment depends on the size of your hernia and whether you have symptoms. If you do not have symptoms, your health care provider may have you watch your hernia carefully and have you come in for follow-up visits. If your hernia is large or if you have symptoms, you may need surgery to repair the hernia. Follow these instructions at home: Lifestyle  Avoid lifting heavy objects.  Avoid standing for long periods of time.  Do not use any products that contain nicotine or tobacco, such as cigarettes and e-cigarettes. If you need help quitting, ask your health care provider.  Maintain a healthy weight. Preventing constipation  Take actions to prevent constipation. Constipation leads to straining with bowel movements, which can make a hernia worse or cause a hernia repair to break down. Your health care provider may recommend that you: ? Drink enough fluid to keep your urine pale yellow. ? Eat foods that are high in fiber, such as fresh fruits and vegetables, whole grains, and beans. ? Limit foods that are high in fat and processed sugars, such as fried or sweet foods. ? Take an over-the-counter or prescription medicine for constipation. General instructions  You may try to push the hernia back in place by very gently pressing on it while lying down. Do not try to force the bulge back in if it will not push in easily.  Watch your hernia for any changes in shape, size, or color. Get help right away if you notice any changes.  Take over-the-counter and prescription medicines only as told by your health care provider.  Keep all follow-up visits as told by your health care provider. This is important. Contact a health care provider if:  You have a fever.  You develop new symptoms.  Your symptoms get worse. Get help right away if:  You have pain in your groin that suddenly gets worse.  You have a bulge in your groin that: ? Suddenly gets bigger and does not  get smaller. ? Becomes red or purple or painful to the touch.  You are a man and you have a sudden pain in your scrotum, or the size of your scrotum suddenly changes.  You cannot push the hernia back in place by very gently pressing on it when you are lying down. Do not try to force the bulge back in if it will not push in easily.  You have nausea or vomiting that does not go away.  You have a fast heartbeat.  You cannot have a  bowel movement or pass gas. These symptoms may represent a serious problem that is an emergency. Do not wait to see if the symptoms will go away. Get medical help right away. Call your local emergency services (911 in the [place] Summary  An inguinal hernia develops when fat or the intestines push through a weak spot in a muscle where your leg meets your lower abdomen (groin).  This condition is caused by having a weak spot in muscles or tissue in your groin.  Symptoms may depend on the size of the hernia, and they may include pain or swelling in your groin. A small inguinal hernia often has no symptoms.  Treatment may not be needed if you do not have symptoms. If you have symptoms or a large hernia, you may need surgery to repair the hernia.  Avoid lifting heavy objects. Also avoid standing for long amounts of time. This information is not intended to replace advice given to you by your health care provider. Make sure you discuss any questions you have with your health care provider. Document Released: 10/16/2008 Document Revised: 07/01/2017 Document Reviewed: 03/01/2017 Elsevier Interactive Patient Education  2019 [employer].

## 2018-07-19 NOTE — Progress Notes (Signed)
Chief Complaint  Patient presents with  . Follow-up   F/u  1. Right inguinal hernia painful and increasing in size and pain radiates to groin right side/testicles. Pain was so bad he had to stand during superbowl last week  -referred to Dr. Darnell Level but out of network  2. 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 Darnell Level is not in network  3. Reviewed labs last visit anemic, elevated lfts, vitamin D def, elevated lipase. Elevated lfts chronic since at least 2018 he has appt with GI Dr. Allen Norris on 07/31/2018  4. C/o hemorrhoids and pain appt with GI 07/31/17 advised he disc with them disc sitz baths and otc prep H/Tucks for now. Diarrhea makes them worse  5. C/o dizziness on triamterene hctz at times not drinking enough water and skipping breakfast at times this happened a couple of weeks ago and has happened in the past not preseent today     Review of Systems  Constitutional: Negative for weight loss.  HENT: Negative for hearing loss.   Eyes: Negative for blurred vision.  Respiratory: Negative for shortness of breath.   Cardiovascular: Negative for chest pain.  Gastrointestinal: Positive for diarrhea.       +hemorrhoids +gas  +chronic diarrhea    Musculoskeletal: Negative for falls.  Skin: Negative for rash.  Neurological: Positive for dizziness.  Psychiatric/Behavioral: Negative for depression.   Past Medical History:  Diagnosis Date  . Anemia, unspecified 02/22/2016  . Blood in stool   . Diarrhea   . Essential hypertension 12/30/2016  . Exocrine pancreatic insufficiency 10/15/2014  . Gastro-esophageal reflux 12/30/2016  . Inguinal hernia   . Vitamin D deficiency 12/30/2016   Past Surgical History:  Procedure Laterality Date  . BARIATRIC SURGERY  07/31/2013   sleeve gastrectomy with duodenal switch   Family History  Problem  Relation Age of Onset  . Pancreatic disease Father   . Diabetes Father   . Hypertension Father   . Diabetes Mother   . Hypertension Mother   . Hypertension Brother        ?  . Prostate cancer Neg Hx   . Kidney cancer Neg Hx   . Bladder Cancer Neg Hx    Social History   Socioeconomic History  . Marital status: Married    Spouse name: Not on file  . Number of children: Not on file  . Years of education: Not on file  . Highest education level: Not on file  Occupational History  . Not on file  Social Needs  . Financial resource strain: Not on file  . Food insecurity:    Worry: Not on file    Inability: Not on file  . Transportation needs:    Medical: Not on file    Non-medical: Not on file  Tobacco Use  . Smoking status: Never Smoker  . Smokeless tobacco: Never Used  Substance and Sexual Activity  . Alcohol use: Yes    Frequency: Never    Comment: rarely  . Drug use: No  . Sexual activity: Yes  Lifestyle  . Physical activity:    Days per week: Not on file    Minutes per session: Not on file  . Stress: Not on file  Relationships  . Social connections:    Talks on phone: Not on file    Gets together: Not on file  Attends religious service: Not on file    Active member of club or organization: Not on file    Attends meetings of clubs or organizations: Not on file    Relationship status: Not on file  . Intimate partner violence:    Fear of current or ex partner: Not on file    Emotionally abused: Not on file    Physically abused: Not on file    Forced sexual activity: Not on file  Other Topics Concern  . Not on file  Social History Narrative   Married    Works in [employer] [place]    No guns    Wears seat belt    Safe in relationship    Current Meds  Medication Sig  . Bismuth Subgallate (DEVROM PO) Take by mouth 3 (three) times daily.  . calcium carbonate (OS-CAL) 1250 (500 Ca) MG chewable tablet Chew by mouth.  . Cholecalciferol 1.25 MG (50000 UT) capsule Take 1  capsule (50,000 Units total) by mouth once a week.  Marland Kitchen CREON 36000 units CPEP capsule Take 4 capsules by mouth 4 (four) times daily. 4 capsules with meals 3x per day and 2-3 caps with snacks 2x per day  . EPINEPHrine 0.3 mg/0.3 mL IJ SOAJ injection Inject 0.3 mLs (0.3 mg total) into the muscle once. Follow package instructions as needed for severe allergy or anaphylactic reaction.  . Ferrous Sulfate 134 MG TABS Take by mouth 2 (two) times daily. 10 mg 2x per day  . Multiple Vitamin (MULTI-VITAMINS) TABS Take by mouth.  . triamterene-hydrochlorothiazide (MAXZIDE) 75-50 MG tablet Take 1 tablet by mouth daily.  . [DISCONTINUED] Cholecalciferol 1.25 MG (50000 UT) capsule Take 1 capsule (50,000 Units total) by mouth once a week.   No Known Allergies Recent Results (from the past 2160 hour(s))  Comprehensive metabolic panel     Status: Abnormal   Collection Time: 07/03/18  3:37 PM  Result Value Ref Range   Sodium 138 135 - 145 mEq/L   Potassium 3.9 3.5 - 5.1 mEq/L   Chloride 107 96 - 112 mEq/L   CO2 23 19 - 32 mEq/L   Glucose, Bld 78 70 - 99 mg/dL   BUN 19 6 - 23 mg/dL   Creatinine, Ser 1.04 0.40 - 1.50 mg/dL   Total Bilirubin 1.2 0.2 - 1.2 mg/dL   Alkaline Phosphatase 205 (H) 39 - 117 U/L   AST 49 (H) 0 - 37 U/L   ALT 59 (H) 0 - 53 U/L   Total Protein 7.7 6.0 - 8.3 g/dL   Albumin 4.7 3.5 - 5.2 g/dL   Calcium 9.3 8.4 - 10.5 mg/dL   GFR 100.73 >60.00 mL/min  CBC with Differential/Platelet     Status: Abnormal   Collection Time: 07/03/18  3:37 PM  Result Value Ref Range   WBC 6.5 4.0 - 10.5 K/uL   RBC 3.65 (L) 4.22 - 5.81 Mil/uL   Hemoglobin 10.8 (L) 13.0 - 17.0 g/dL   HCT 33.2 (L) 39.0 - 52.0 %   MCV 90.9 78.0 - 100.0 fl   MCHC 32.5 30.0 - 36.0 g/dL   RDW 15.1 11.5 - 15.5 %   Platelets 216.0 150.0 - 400.0 K/uL   Neutrophils Relative % 54.3 43.0 - 77.0 %   Lymphocytes Relative 38.6 12.0 - 46.0 %   Monocytes Relative 5.5 3.0 - 12.0 %   Eosinophils Relative 0.8 0.0 - 5.0 %   Basophils  Relative 0.8 0.0 - 3.0 %   Neutro Abs 3.5  1.4 - 7.7 K/uL   Lymphs Abs 2.5 0.7 - 4.0 K/uL   Monocytes Absolute 0.4 0.1 - 1.0 K/uL   Eosinophils Absolute 0.1 0.0 - 0.7 K/uL   Basophils Absolute 0.1 0.0 - 0.1 K/uL  Iron, TIBC and Ferritin Panel     Status: None   Collection Time: 07/03/18  3:37 PM  Result Value Ref Range   Iron 78 50 - 180 mcg/dL   TIBC 337 250 - 425 mcg/dL (calc)   %SAT 23 20 - 48 % (calc)   Ferritin 332 38 - 380 ng/mL  T4, free     Status: None   Collection Time: 07/03/18  3:37 PM  Result Value Ref Range   Free T4 0.77 0.60 - 1.60 ng/dL    Comment: Specimens from patients who are undergoing biotin therapy and /or ingesting biotin supplements may contain high levels of biotin.  The higher biotin concentration in these specimens interferes with this Free T4 assay.  Specimens that contain high levels  of biotin may cause false high results for this Free T4 assay.  Please interpret results in light of the total clinical presentation of the patient.    TSH     Status: None   Collection Time: 07/03/18  3:37 PM  Result Value Ref Range   TSH 3.29 0.35 - 4.50 uIU/mL  Urinalysis, Routine w reflex microscopic     Status: None   Collection Time: 07/03/18  3:37 PM  Result Value Ref Range   Color, Urine YELLOW YELLOW   APPearance CLEAR CLEAR   Specific Gravity, Urine 1.020 1.001 - 1.03   pH 6.0 5.0 - 8.0   Glucose, UA NEGATIVE NEGATIVE   Bilirubin Urine NEGATIVE NEGATIVE   Ketones, ur NEGATIVE NEGATIVE   Hgb urine dipstick NEGATIVE NEGATIVE   Protein, ur NEGATIVE NEGATIVE   Nitrite NEGATIVE NEGATIVE   Leukocytes, UA NEGATIVE NEGATIVE  B12     Status: None   Collection Time: 07/03/18  3:37 PM  Result Value Ref Range   Vitamin B-12 899 211 - 911 pg/mL  Vitamin D (25 hydroxy)     Status: Abnormal   Collection Time: 07/03/18  3:37 PM  Result Value Ref Range   VITD 13.70 (L) 30.00 - 100.00 ng/mL  Lipase     Status: Abnormal   Collection Time: 07/03/18  3:37 PM  Result  Value Ref Range   Lipase 65.0 (H) 11.0 - 59.0 U/L  Measles/Mumps/Rubella Immunity     Status: None   Collection Time: 07/03/18  3:45 PM  Result Value Ref Range   Rubeola IgG 84.00 AU/mL    Comment: AU/mL            Interpretation -----            -------------- <13.50           Negative 13.50-16.49      Equivocal >16.49           Positive . A positive result indicates that the patient has antibody to measles virus. It does not differentiate  between an active or past infection. The clinical  diagnosis must be interpreted in conjunction with  clinical signs and symptoms of the patient.    Mumps IgG 93.50 AU/mL    Comment:  AU/mL           Interpretation -------         ---------------- <9.00             Negative 9.00-10.99  Equivocal >10.99            Positive A positive result indicates that the patient has  antibody to mumps virus. It does not differentiate between an  active or past infection. The clinical diagnosis must be interpreted in conjunction with clinical signs and symptoms of the patient. .    Rubella 5.75 index    Comment:     Index            Interpretation     -----            --------------       <0.90            Not consistent with Immunity     0.90-0.99        Equivocal     > or = 1.00      Consistent with Immunity  . The presence of rubella IgG antibody suggests  immunization or past or current infection with rubella virus.   Hepatitis B surface antibody,quantitative     Status: Abnormal   Collection Time: 07/03/18  3:45 PM  Result Value Ref Range   Hepatitis B-Post 6 (L) > OR = 10 mIU/mL    Comment: . Patient does not have immunity to hepatitis B virus. . For additional information, please refer to http://education.quest[facility].com/faq/FAQ105 (This link is being provided for informational/ educational purposes only).    Objective  Body mass index is 23.15 kg/m. Wt Readings from Last 3 Encounters:  07/19/18 166 lb (75.3 kg)   07/03/18 177 lb 3.2 oz (80.4 kg)  04/10/18 169 lb 6.4 oz (76.8 kg)   Temp Readings from Last 3 Encounters:  07/19/18 98.2 F (36.8 C) (Oral)  07/03/18 98.1 F (36.7 C) (Oral)  02/21/18 97.9 F (36.6 C) (Temporal)   BP Readings from Last 3 Encounters:  07/19/18 130/70  07/03/18 118/72  04/10/18 106/63   Pulse Readings from Last 3 Encounters:  07/19/18 (!) 101  07/03/18 97  04/10/18 78    Physical Exam Vitals signs and nursing note reviewed.  Constitutional:      Appearance: Normal appearance. He is well-developed and well-groomed.  HENT:     Head: Normocephalic and atraumatic.     Nose: Nose normal.     Mouth/Throat:     Mouth: Mucous membranes are moist.     Pharynx: Oropharynx is clear.  Eyes:     Conjunctiva/sclera: Conjunctivae normal.     Pupils: Pupils are equal, round, and reactive to light.  Cardiovascular:     Rate and Rhythm: Normal rate and regular rhythm.     Pulses: Normal pulses.     Heart sounds: Normal heart sounds.  Pulmonary:     Effort: Pulmonary effort is normal.     Breath sounds: Normal breath sounds.  Abdominal:     Hernia: A hernia is present. Hernia is present in the right inguinal area.  Skin:    General: Skin is warm and dry.  Neurological:     General: No focal deficit present.     Mental Status: He is alert and oriented to person, place, and time. Mental status is at baseline.     Gait: Gait normal.  Psychiatric:        Attention and Perception: Attention and perception normal.        Mood and Affect: Mood and affect normal.        Speech: Speech normal.        Behavior: Behavior normal. Behavior is cooperative.  Thought Content: Thought content normal.        Cognition and Memory: Cognition and memory normal.        Judgment: Judgment normal.     Assessment   1. Right inguinal hernia enlarging and painful and complications s/p SIPS (weight loss procedure in 2014/2015) with pancreatitic insuff, elevated lfts and lipase  (prev imaging with GB sludge), chronic diarrhea, indigestion now with hemorrhoids  2. Vitamin D def  3. Hemorrhoids  4. Dizziness on diuretic likely due to lack of hydration 5. HM Plan   1. Will disc case with [facility] Dr. Alphonsa Overall or Dr. Kaylyn Lim Dr. Darnell Level not in network  GI Dr. Allen Norris appt 07/31/2018  Consider CT ab/pelvis in future pt want to check with insurance  2. rec pick up vitamin D  3. Sitz baths disc with GI upcoming appt 07/31/18 disc otc tucks/prep h  Given info  4. Increase hydration rec dont skip meals  5.  Flu shot utd  Consider Tdap in future  Consider hep B in future not immune  MMR immune   Hep C neg 09/19/17  HIV neg 02/14/17   Provider: Dr. Olivia Mackie McLean-Scocuzza-[facility]

## 2018-07-20 ENCOUNTER — Other Ambulatory Visit: Payer: Self-pay | Admitting: Internal Medicine

## 2018-07-20 ENCOUNTER — Ambulatory Visit (INDEPENDENT_AMBULATORY_CARE_PROVIDER_SITE_OTHER): Payer: Self-pay | Admitting: Physician Assistant

## 2018-07-20 ENCOUNTER — Telehealth: Payer: Self-pay

## 2018-07-20 ENCOUNTER — Encounter: Payer: Self-pay | Admitting: Physician Assistant

## 2018-07-20 VITALS — BP 110/80 | HR 86 | Temp 98.0°F | Resp 16 | Ht 69.0 in | Wt 163.0 lb

## 2018-07-20 DIAGNOSIS — K4090 Unilateral inguinal hernia, without obstruction or gangrene, not specified as recurrent: Secondary | ICD-10-CM

## 2018-07-20 DIAGNOSIS — K861 Other chronic pancreatitis: Secondary | ICD-10-CM

## 2018-07-20 DIAGNOSIS — K8689 Other specified diseases of pancreas: Secondary | ICD-10-CM

## 2018-07-20 DIAGNOSIS — K529 Noninfective gastroenteritis and colitis, unspecified: Secondary | ICD-10-CM

## 2018-07-20 DIAGNOSIS — J069 Acute upper respiratory infection, unspecified: Secondary | ICD-10-CM

## 2018-07-20 DIAGNOSIS — R52 Pain, unspecified: Secondary | ICD-10-CM

## 2018-07-20 DIAGNOSIS — Z9884 Bariatric surgery status: Secondary | ICD-10-CM

## 2018-07-20 DIAGNOSIS — R748 Abnormal levels of other serum enzymes: Secondary | ICD-10-CM

## 2018-07-20 LAB — POCT INFLUENZA A/B
Influenza A, POC: NEGATIVE
Influenza B, POC: NEGATIVE

## 2018-07-20 MED ORDER — OXYMETAZOLINE HCL 0.05 % NA SOLN: 1.0000 | Freq: Two times a day (BID) | NASAL | 0 refills | Status: DC

## 2018-07-20 MED ORDER — BENZONATATE 100 MG PO CAPS: 100.0000 mg | ORAL_CAPSULE | Freq: Three times a day (TID) | ORAL | 0 refills | Status: DC | PRN

## 2018-07-20 MED ORDER — FLUTICASONE PROPIONATE 50 MCG/ACT NA SUSP: 2.0000 | Freq: Every day | NASAL | 0 refills | Status: DC

## 2018-07-20 NOTE — Telephone Encounter (Signed)
Copied from [place] [phone]. Topic: General - Other >> Jul 20, 2018  9:37 AM Alanda Slim E wrote: Reason for CRM: Pt was seen yesterday and insurance was unable to be verified. Pt contacted insurance and insurance said he was active and to have the office fax the bill directly to the insurance company([employer]).  Pt called insurance and they did approve the CT scan of the abdomen and Dr. Aundra Dubin just needs to send the referral.

## 2018-07-20 NOTE — Telephone Encounter (Signed)
Please refer CT ab/pelvis and appt [facility] Dr. Kaylyn Lim week of 07/30/2018 [place] location   Thanks [employer]

## 2018-07-20 NOTE — Progress Notes (Signed)
MRN: [id number] DOB: July 13, 1985  Subjective:   Thomas Shaw is a 33 y.o. male presenting for chief complaint of Cough (x2d) and PND (x2d) .  Had scratchy throat yesterday. Woke up this morning with nasal drainage, congestion, post nasal drip, cough, and body aches. Feels like the phlegm he is coughing up is from post nasal drainage. Denies fever, chills, sinus pain, ear fullness, inability to swallow, voice change, wheezing, shortness of breath, chest tightness and chest pain, nausea, vomiting, abdominal pain and diarrhea. Has tried Copywriter, advertising with no full relief. No known sick contact exposure. Smokes cigarettes occasionally. Was just seen by his family doctor yesterday but notes he had a lot of stuff to go over so did not mention this.  PMH of gastric bypass, exocrine pancreatic insufficiency, elevated liver enzymes, and HTN. No PMH of DM, seasonal allergies, asthma, or COPD.  Denies any other aggravating or relieving factors, no other questions or concerns.  Review of Systems  Constitutional: Negative for diaphoresis.  HENT: Negative for ear pain.   Musculoskeletal: Negative for neck pain.  Skin: Negative for rash.  Neurological: Negative for headaches.    Thomas Shaw has a current medication list which includes the following prescription(s): bismuth subgallate, calcium carbonate, cholecalciferol, creon, epinephrine, ferrous sulfate, multi-vitamins, triamterene-hydrochlorothiazide, benzonatate, fluticasone, and oxymetazoline. Also has No Known Allergies.  Thomas Shaw  has a past medical history of Anemia, unspecified (02/22/2016), Blood in stool, Diarrhea, Essential hypertension (12/30/2016), Exocrine pancreatic insufficiency (10/15/2014), Gastro-esophageal reflux (12/30/2016), Inguinal hernia, and Vitamin D deficiency (12/30/2016). Also  has a past surgical history that includes Bariatric Surgery (07/31/2013).   Objective:   Vitals: BP 110/80 (BP Location: Right Arm, Patient Position: Sitting, Cuff  Size: Normal)   Pulse 86   Temp 98 F (36.7 C)   Resp 16   Ht 5\' 9"  (1.753 m)   Wt 163 lb (73.9 kg)   SpO2 99%   BMI 24.07 kg/m   Physical Exam Vitals signs reviewed.  Constitutional:      General: He is not in acute distress.    Appearance: He is well-developed. He is not ill-appearing or toxic-appearing.  HENT:     Head: Normocephalic and atraumatic.     Right Ear: Tympanic membrane, ear canal and external ear normal.     Left Ear: Tympanic membrane, ear canal and external ear normal.     Nose: Mucosal edema, congestion and rhinorrhea (moderate amt of clear rhinorrhea noted within nasal canals b/l) present.     Right Sinus: No maxillary sinus tenderness or frontal sinus tenderness.     Left Sinus: No maxillary sinus tenderness or frontal sinus tenderness.     Mouth/Throat:     Lips: Pink.     Mouth: Mucous membranes are moist.     Pharynx: Uvula midline. Posterior oropharyngeal erythema (mild) present.     Tonsils: No tonsillar exudate or tonsillar abscesses. Swelling: 1+ on the right. 1+ on the left.  Eyes:     Conjunctiva/sclera: Conjunctivae normal.  Neck:     Musculoskeletal: Normal range of motion.  Cardiovascular:     Rate and Rhythm: Normal rate and regular rhythm.     Heart sounds: Normal heart sounds.  Pulmonary:     Effort: Pulmonary effort is normal.     Breath sounds: Normal breath sounds. No decreased breath sounds, wheezing, rhonchi or rales.  Lymphadenopathy:     Head:     Right side of head: No submental, submandibular, tonsillar, preauricular, posterior auricular or occipital adenopathy.  Left side of head: No submental, submandibular, tonsillar, preauricular, posterior auricular or occipital adenopathy.     Cervical: No cervical adenopathy.     Upper Body:     Right upper body: No supraclavicular adenopathy.     Left upper body: No supraclavicular adenopathy.  Skin:    General: Skin is warm and dry.  Neurological:     Mental Status: He is alert.     Results for orders placed or performed in visit on 07/20/18 (from the past 24 hour(s))  POCT Influenza A/B     Status: Normal   Collection Time: 07/20/18  1:27 PM  Result Value Ref Range   Influenza A, POC Negative Negative   Influenza B, POC Negative Negative    Assessment and Plan :  1. Viral URI Patient is overall well-appearing, no acute distress.  VSS. History and physical exam are consistent with viral URI. POC flu test negative-pt reassured.  Given educational material on viral URI.  Recommend symptomatic treatment at this time.  Advised to follow-up in office, with family doctor, or local urgent care if no improvement in symptoms after 7 to 10 days.  Seek care sooner at local urgent care or ED if symptoms worsen/develop new concerning symptoms.  Patient voices understanding. - benzonatate (TESSALON) 100 MG capsule; Take 1-2 capsules (100-200 mg total) by mouth 3 (three) times daily as needed for cough.  Dispense: 40 capsule; Refill: 0 - fluticasone (FLONASE) 50 MCG/ACT nasal spray; Place 2 sprays into both nostrils daily.  Dispense: 16 g; Refill: 0 - oxymetazoline (AFRIN NASAL SPRAY) 0.05 % nasal spray; Place 1 spray into both nostrils 2 (two) times daily.  Dispense: 30 mL; Refill: 0  2. Body aches - POCT Influenza A/B  Tenna Delaine, PA-C  [facility] 07/20/2018 1:37 PM

## 2018-07-20 NOTE — Patient Instructions (Addendum)
Viral Respiratory Infection  -Rest, increase fluids, and eat light meals. -May use tessalon perles for cough.  -May use flonase and afrin for nasal congestion and drainage. Stop use of afrin after 3 days.  -May use warm tea with honey and lemon for scratchy throat.  -Use a humidifier or vaporizer when at home and during sleep to help with cough and nasal congestion. -Follow up with family doctor if no improvement in 7-10 days. Seek care sooner if symptoms worsen/develop new concerning symptoms.    A viral respiratory infection is an illness that affects parts of the body that are used for breathing. These include the lungs, nose, and throat. It is caused by a germ called a virus. Some examples of this kind of infection are:  A cold.  The flu (influenza).  A respiratory syncytial virus (RSV) infection. A person who gets this illness may have the following symptoms:  A stuffy or runny nose.  Yellow or green fluid in the nose.  A cough.  Sneezing.  Tiredness (fatigue).  Achy muscles.  A sore throat.  Sweating or chills.  A fever.  A headache. Follow these instructions at home: Managing pain and congestion  Take over-the-counter and prescription medicines only as told by your doctor.  If you have a sore throat, gargle with salt water. Do this 3-4 times per day or as needed. To make a salt-water mixture, dissolve -1 tsp of salt in 1 cup of warm water. Make sure that all the salt dissolves.  Use nose drops made from salt water. This helps with stuffiness (congestion). It also helps soften the skin around your nose.  Drink enough fluid to keep your pee (urine) pale yellow. General instructions   Rest as much as possible.  Do not drink alcohol.  Do not use any products that have nicotine or tobacco, such as cigarettes and e-cigarettes. If you need help quitting, ask your doctor.  Keep all follow-up visits as told by your doctor. This is important. How is this  prevented?   Get a flu shot every year. Ask your doctor when you should get your flu shot.  Do not let other people get your germs. If you are sick: ? Stay home from work or school. ? Wash your hands with soap and water often. Wash your hands after you cough or sneeze. If soap and water are not available, use hand sanitizer.  Avoid contact with people who are sick during cold and flu season. This is in fall and winter. Get help if:  Your symptoms last for 10 days or longer.  Your symptoms get worse over time.  You have a fever.  You have very bad pain in your face or forehead.  Parts of your jaw or neck become very swollen. Get help right away if:  You feel pain or pressure in your chest.  You have shortness of breath.  You faint or feel like you will faint.  You keep throwing up (vomiting).  You feel confused. Summary  A viral respiratory infection is an illness that affects parts of the body that are used for breathing.  Examples of this illness include a cold, the flu, and respiratory syncytial virus (RSV) infection.  The infection can cause a runny nose, cough, sneezing, sore throat, and fever.  Follow what your doctor tells you about taking medicines, drinking lots of fluid, washing your hands, resting at home, and avoiding people who are sick. This information is not intended to replace advice  given to you by your health care provider. Make sure you discuss any questions you have with your health care provider. Document Released: 05/12/2008 Document Revised: 07/10/2017 Document Reviewed: 07/10/2017 Elsevier Interactive Patient Education  2019 [employer].

## 2018-07-20 NOTE — Telephone Encounter (Signed)
I don't see a CT referral in the system for this patient.

## 2018-07-20 NOTE — Telephone Encounter (Signed)
I have added this information to the guarantor note to inform billing to send a paper copy to his insurance carrier.

## 2018-07-24 ENCOUNTER — Telehealth: Payer: Self-pay | Admitting: Emergency Medicine

## 2018-07-24 NOTE — Telephone Encounter (Signed)
Spoke with patient whom stated that he I getting better. This is a follow up call from [facility].

## 2018-07-31 ENCOUNTER — Ambulatory Visit: Payer: No Typology Code available for payment source | Admitting: Gastroenterology

## 2018-07-31 ENCOUNTER — Encounter: Payer: Self-pay | Admitting: Gastroenterology

## 2018-07-31 VITALS — BP 97/62 | HR 90 | Ht 69.0 in | Wt 172.2 lb

## 2018-07-31 DIAGNOSIS — R197 Diarrhea, unspecified: Secondary | ICD-10-CM

## 2018-07-31 NOTE — Progress Notes (Signed)
Primary Care Physician: McLean-Scocuzza, Nino Glow, MD  Primary Gastroenterologist:  Dr. Lucilla Lame  Chief Complaint  Patient presents with  . iron deficiency anemia    HPI: Thomas Shaw is a 33 y.o. male here for follow-up of his history of malabsorption. The patient was treated with Xifaxan for possible bacterial overgrowth.  The patient states he noticed improvement.  The patient also reported less gas and bloating when he stopped in cheese products.  The patient now reports he has been having significantly less diarrhea.  But still has some.  His weight has been stable and he denies any continued bloating.  The patient has been worried about a hernia he has in the right inguinal area and he is already set up to see a Psychologist, sport and exercise.  Current Outpatient Medications  Medication Sig Dispense Refill  . benzonatate (TESSALON) 100 MG capsule Take 1-2 capsules (100-200 mg total) by mouth 3 (three) times daily as needed for cough. 40 capsule 0  . Bismuth Subgallate (DEVROM PO) Take by mouth 3 (three) times daily.    . calcium carbonate (OS-CAL) 1250 (500 Ca) MG chewable tablet Chew by mouth.    . Cholecalciferol 1.25 MG (50000 UT) capsule Take 1 capsule (50,000 Units total) by mouth once a week. 13 capsule 1  . CREON 36000 units CPEP capsule Take 4 capsules by mouth 4 (four) times daily. 4 capsules with meals 3x per day and 2-3 caps with snacks 2x per day  11  . EPINEPHrine 0.3 mg/0.3 mL IJ SOAJ injection Inject 0.3 mLs (0.3 mg total) into the muscle once. Follow package instructions as needed for severe allergy or anaphylactic reaction. 1 Device 2  . Ferrous Sulfate 134 MG TABS Take by mouth 2 (two) times daily. 10 mg 2x per day    . fluticasone (FLONASE) 50 MCG/ACT nasal spray Place 2 sprays into both nostrils daily. 16 g 0  . Multiple Vitamin (MULTI-VITAMINS) TABS Take by mouth.    Marland Kitchen oxymetazoline (AFRIN NASAL SPRAY) 0.05 % nasal spray Place 1 spray into both nostrils 2 (two) times daily. 30 mL  0  . triamterene-hydrochlorothiazide (MAXZIDE) 75-50 MG tablet Take 1 tablet by mouth daily.     No current facility-administered medications for this visit.     Allergies as of 07/31/2018  . (No Known Allergies)    ROS:  General: Negative for anorexia, weight loss, fever, chills, fatigue, weakness. ENT: Negative for hoarseness, difficulty swallowing , nasal congestion. CV: Negative for chest pain, angina, palpitations, dyspnea on exertion, peripheral edema.  Respiratory: Negative for dyspnea at rest, dyspnea on exertion, cough, sputum, wheezing.  GI: See history of present illness. GU:  Negative for dysuria, hematuria, urinary incontinence, urinary frequency, nocturnal urination.  Endo: Negative for unusual weight change.    Physical Examination:   BP 97/62   Pulse 90   Ht 5\' 9"  (1.753 m)   Wt 172 lb 3.2 oz (78.1 kg)   BMI 25.43 kg/m   General: Well-nourished, well-developed in no acute distress.  Eyes: No icterus. Conjunctivae pink. Mouth: Oropharyngeal mucosa moist and pink , no lesions erythema or exudate. Lungs: Clear to auscultation bilaterally. Non-labored. Heart: Regular rate and rhythm, no murmurs rubs or gallops.  Abdomen: Bowel sounds are normal, nontender, nondistended, no hepatosplenomegaly or masses, no abdominal bruits or hernia , no rebound or guarding.   Extremities: No lower extremity edema. No clubbing or deformities. Neuro: Alert and oriented x 3.  Grossly intact. Skin: Warm and dry, no jaundice.  Psych: Alert and cooperative, normal mood and affect.  Labs:    Imaging Studies: No results found.  Assessment and Plan:   Thomas Shaw is a 33 y.o. y/o male Who comes in today with a history of malabsorption who was treated with Xifaxan.  The patient has avoided milk and is doing much better. The patient has been told that he can have cheese with lactate supplementation.  He will also increase fiber in his diet and has been given samples of fiber to  help bulk up his loose bowel movements.  The patient will continue his pancreatic enzymes also.  The patient has been explained the plan and agrees with it.    Lucilla Lame, MD. Marval Regal   Note: This dictation was prepared with Dragon dictation along with smaller phrase technology. Any transcriptional errors that result from this process are unintentional.

## 2018-08-01 ENCOUNTER — Ambulatory Visit
Admission: RE | Admit: 2018-08-01 | Discharge: 2018-08-01 | Disposition: A | Discharge status: Home / Self Care | Payer: No Typology Code available for payment source | Source: Ambulatory Visit | Attending: Internal Medicine | Admitting: Internal Medicine

## 2018-08-01 DIAGNOSIS — K4090 Unilateral inguinal hernia, without obstruction or gangrene, not specified as recurrent: Secondary | ICD-10-CM | POA: Diagnosis present

## 2018-08-01 DIAGNOSIS — R748 Abnormal levels of other serum enzymes: Secondary | ICD-10-CM | POA: Insufficient documentation

## 2018-08-01 DIAGNOSIS — K529 Noninfective gastroenteritis and colitis, unspecified: Secondary | ICD-10-CM | POA: Insufficient documentation

## 2018-08-01 DIAGNOSIS — K861 Other chronic pancreatitis: Secondary | ICD-10-CM | POA: Insufficient documentation

## 2018-08-01 DIAGNOSIS — K8689 Other specified diseases of pancreas: Secondary | ICD-10-CM | POA: Insufficient documentation

## 2018-08-01 MED ORDER — IOPAMIDOL (ISOVUE-300) INJECTION 61%
100.0000 mL | Freq: Once | INTRAVENOUS | Status: AC | PRN
  Administered 2018-08-01: 100 mL via INTRAVENOUS

## 2018-08-09 ENCOUNTER — Other Ambulatory Visit (HOSPITAL_COMMUNITY): Payer: Self-pay | Admitting: Surgery

## 2018-08-09 ENCOUNTER — Other Ambulatory Visit: Payer: Self-pay | Admitting: Surgery

## 2018-08-09 DIAGNOSIS — K8020 Calculus of gallbladder without cholecystitis without obstruction: Secondary | ICD-10-CM

## 2018-08-17 ENCOUNTER — Ambulatory Visit (HOSPITAL_COMMUNITY): Payer: No Typology Code available for payment source

## 2018-08-23 ENCOUNTER — Other Ambulatory Visit: Payer: Self-pay

## 2018-08-23 ENCOUNTER — Ambulatory Visit
Admission: RE | Admit: 2018-08-23 | Discharge: 2018-08-23 | Disposition: A | Discharge status: Home / Self Care | Payer: No Typology Code available for payment source | Source: Ambulatory Visit | Attending: Surgery | Admitting: Surgery

## 2018-08-23 DIAGNOSIS — K8020 Calculus of gallbladder without cholecystitis without obstruction: Secondary | ICD-10-CM | POA: Insufficient documentation

## 2018-08-23 MED ORDER — TECHNETIUM TC 99M MEBROFENIN IV KIT
5.6150 | PACK | Freq: Once | INTRAVENOUS | Status: AC | PRN
  Administered 2018-08-23: 5.615 via INTRAVENOUS

## 2018-08-24 ENCOUNTER — Encounter: Payer: Self-pay | Admitting: Emergency Medicine

## 2018-08-24 ENCOUNTER — Emergency Department
Admission: EM | Admit: 2018-08-24 | Discharge: 2018-08-24 | Disposition: A | Discharge status: Home / Self Care | Payer: No Typology Code available for payment source | Attending: Emergency Medicine | Admitting: Emergency Medicine

## 2018-08-24 ENCOUNTER — Other Ambulatory Visit: Payer: Self-pay

## 2018-08-24 DIAGNOSIS — R55 Syncope and collapse: Secondary | ICD-10-CM | POA: Insufficient documentation

## 2018-08-24 DIAGNOSIS — Z79899 Other long term (current) drug therapy: Secondary | ICD-10-CM | POA: Insufficient documentation

## 2018-08-24 DIAGNOSIS — I10 Essential (primary) hypertension: Secondary | ICD-10-CM | POA: Diagnosis not present

## 2018-08-24 LAB — COMPREHENSIVE METABOLIC PANEL
ALT: 40 U/L (ref 0–44)
AST: 35 U/L (ref 15–41)
Albumin: 4.2 g/dL (ref 3.5–5.0)
Alkaline Phosphatase: 164 U/L — ABNORMAL HIGH (ref 38–126)
Anion gap: 9 (ref 5–15)
BUN: 33 mg/dL — ABNORMAL HIGH (ref 6–20)
CO2: 21 mmol/L — ABNORMAL LOW (ref 22–32)
Calcium: 9.3 mg/dL (ref 8.9–10.3)
Chloride: 108 mmol/L (ref 98–111)
Creatinine, Ser: 1.17 mg/dL (ref 0.61–1.24)
GFR calc Af Amer: >60 mL/min (ref >60)
GFR calc non Af Amer: >60 mL/min (ref >60)
Glucose, Bld: 107 mg/dL — ABNORMAL HIGH (ref 70–99)
Potassium: 4.1 mmol/L (ref 3.5–5.1)
Sodium: 138 mmol/L (ref 135–145)
Total Bilirubin: 1.1 mg/dL (ref 0.3–1.2)
Total Protein: 7.7 g/dL (ref 6.5–8.1)

## 2018-08-24 LAB — CBC
HCT: 31 % — ABNORMAL LOW (ref 39.0–52.0)
Hemoglobin: 9.9 g/dL — ABNORMAL LOW (ref 13.0–17.0)
MCH: 28.8 pg (ref 26.0–34.0)
MCHC: 31.9 g/dL (ref 30.0–36.0)
MCV: 90.1 fL (ref 80.0–100.0)
PLATELETS: 225 10*3/uL (ref 150–400)
RBC: 3.44 MIL/uL — AB (ref 4.22–5.81)
RDW: 13.2 % (ref 11.5–15.5)
WBC: 12.2 10*3/uL — ABNORMAL HIGH (ref 4.0–10.5)
nRBC: 0 % (ref 0.0–0.2)

## 2018-08-24 LAB — GLUCOSE, CAPILLARY: Glucose-Capillary: 85 mg/dL (ref 70–99)

## 2018-08-24 MED ORDER — SODIUM CHLORIDE 0.9 % IV SOLN
1000.0000 mL | Freq: Once | INTRAVENOUS | Status: AC
  Administered 2018-08-24: 1000 mL via INTRAVENOUS

## 2018-08-24 NOTE — ED Triage Notes (Signed)
PT has an episode where pt lost consciousness. Pt states prior to episode he felt dizzy and "then I woke up on the ground." pt arrives to the ED a&o x 4.

## 2018-08-24 NOTE — ED Provider Notes (Signed)
[facility] Emergency Department Provider Note   ____________________________________________    I have reviewed the triage vital signs and the nursing notes.   HISTORY  Chief Complaint Loss of Consciousness     HPI Thomas Shaw is a 33 y.o. male who presents after a syncopal episode.  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  Patient reports a history of bariatric surgery.  He notes he became quite lightheaded very quickly.  Denies chest pain or palpitations.  No shortness of breath.  No neuro deficits.  He reports this happened once before about a year and a half ago with similar circumstances.  Currently he feels well except "weak all over  Past Medical History:  Diagnosis Date  . Anemia, unspecified 02/22/2016  . Blood in stool   . Diarrhea   . Essential hypertension 12/30/2016  . Exocrine pancreatic insufficiency 10/15/2014  . Gastro-esophageal reflux 12/30/2016  . Inguinal hernia   . Vitamin D deficiency 12/30/2016    Patient Active Problem List   Diagnosis Date Noted  . 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 Indigestion 07/19/2018  . Hemorrhoids 07/19/2018  . S/P gastric surgery 07/04/2018  . Iron deficiency 07/04/2018  . Chronic diarrhea 07/04/2018  . Right inguinal hernia 07/04/2018  . Blood in stool 07/04/2018  . Essential hypertension 12/30/2016  . Obstructive sleep apnea of adult 12/30/2016  . Gastro-esophageal reflux 12/30/2016  . Vitamin D deficiency 12/30/2016  . Anemia, unspecified 02/22/2016  . Bilateral leg paresthesia 02/22/2016  . Epigastric pain 04/15/2015  . Exocrine pancreatic insufficiency 10/15/2014    Past Surgical History:  Procedure Laterality Date  . BARIATRIC SURGERY  07/31/2013   sleeve gastrectomy with duodenal switch    Prior to Admission medications   Medication Sig Start Date End Date Taking?  Authorizing Provider  calcium carbonate (OS-CAL) 1250 (500 Ca) MG chewable tablet Chew by mouth.   Yes [provider]  Cholecalciferol 1.25 MG (50000 UT) capsule Take 1 capsule (50,000 Units total) by mouth once a week. 07/19/18  Yes McLean-Scocuzza, Nino Glow, MD  CREON 36000 units CPEP capsule Take 4 capsules by mouth 4 (four) times daily. 4 capsules with meals 3x per day and 2-3 caps with snacks 2x per day 10/12/15  Yes [provider]  Ferrous Sulfate 134 MG TABS Take by mouth 2 (two) times daily. 10 mg 2x per day   Yes [provider]  Multiple Vitamin (MULTI-VITAMINS) TABS Take 1 tablet by mouth daily.    Yes [provider]  triamterene-hydrochlorothiazide (MAXZIDE) 75-50 MG tablet Take 1 tablet by mouth daily.   Yes [provider]  benzonatate (TESSALON) 100 MG capsule Take 1-2 capsules (100-200 mg total) by mouth 3 (three) times daily as needed for cough. 07/20/18   Tenna Delaine D, PA-C  Bismuth Subgallate (DEVROM PO) Take by mouth 3 (three) times daily.    [provider]  EPINEPHrine 0.3 mg/0.3 mL IJ SOAJ injection Inject 0.3 mLs (0.3 mg total) into the muscle once. Follow package instructions as needed for severe allergy or anaphylactic reaction. 12/08/15   Carrie Mew, MD  fluticasone [facility]) 50 MCG/ACT nasal spray Place 2 sprays into both nostrils daily. 07/20/18   Tenna Delaine D, PA-C  oxymetazoline (AFRIN NASAL SPRAY) 0.05 % nasal spray Place 1 spray into both nostrils 2 (two) times daily. 07/20/18   Leonie Douglas, PA-C  Allergies Patient has no known allergies.  Family History  Problem Relation Age of Onset  . Pancreatic disease Father   . Diabetes Father   . Hypertension Father   . Diabetes Mother   . Hypertension Mother   . Hypertension Brother        ?  . Prostate cancer Neg Hx   . Kidney cancer Neg Hx   . Bladder Cancer Neg Hx     Social History Social History   Tobacco Use  . Smoking status:  Never Smoker  . Smokeless tobacco: Never Used  Substance Use Topics  . Alcohol use: Yes    Frequency: Never    Comment: rarely  . Drug use: No    Review of Systems  Constitutional: No fever/chills Eyes: No visual changes.  ENT: No neck pain Cardiovascular: Denies chest pain.  No palpitations Respiratory: Denies shortness of breath. Gastrointestinal: No abdominal pain.   Genitourinary: Negative for dysuria. Musculoskeletal: Negative for back pain. Skin: Negative for rash. Neurological: No neuro deficits   ____________________________________________   PHYSICAL EXAM:  VITAL SIGNS: ED Triage Vitals  Enc Vitals Group     BP 08/24/18 1024 110/63     Pulse Rate 08/24/18 1024 81     Resp 08/24/18 1024 16     Temp 08/24/18 1024 98 F (36.7 C)     Temp Source 08/24/18 1024 Oral     SpO2 08/24/18 1024 99 %     Weight 08/24/18 1026 74.8 kg (165 lb)     Height 08/24/18 1026 1.803 m (5\' 11" )     Head Circumference --      Peak Flow --      Pain Score 08/24/18 1026 0     Pain Loc --      Pain Edu? --      Excl. in [place]? --     Constitutional: Alert and oriented. Eyes: Conjunctivae are normal.  Head: Atraumatic. Nose: No congestion/rhinnorhea. Mouth/Throat: Mucous membranes are moist.   Neck:  Painless ROM, no vertebral tenderness palpation Cardiovascular: Normal rate, regular rhythm. Grossly normal heart sounds.  Good peripheral circulation. Respiratory: Normal respiratory effort.  No retractions. Lungs CTAB. Gastrointestinal: Soft and nontender. No distention.   Musculoskeletal: No lower extremity tenderness nor edema.  Warm and well perfused Neurologic:  Normal speech and language. No gross focal neurologic deficits are appreciated.  Skin:  Skin is warm, dry and intact. No rash noted. Psychiatric: Mood and affect are normal. Speech and behavior are normal.  ____________________________________________   LABS (all labs ordered are listed, but only abnormal results  are displayed)  Labs Reviewed  CBC - Abnormal; Notable for the following components:      Result Value   WBC 12.2 (*)    RBC 3.44 (*)    Hemoglobin 9.9 (*)    HCT 31.0 (*)    All other components within normal limits  COMPREHENSIVE METABOLIC PANEL - Abnormal; Notable for the following components:   CO2 21 (*)    Glucose, Bld 107 (*)    BUN 33 (*)    Alkaline Phosphatase 164 (*)    All other components within normal limits  GLUCOSE, CAPILLARY   ____________________________________________  EKG  ED ECG REPORT I, Lavonia Drafts, the attending physician, personally viewed and interpreted this ECG.  Date: 08/24/2018  Rhythm: normal sinus rhythm QRS Axis: normal Intervals: normal ST/T Wave abnormalities: normal Narrative Interpretation: no evidence of acute ischemia  ____________________________________________  RADIOLOGY  None ____________________________________________   PROCEDURES  Procedure(s) performed: No  Procedures   Critical Care performed: No ____________________________________________   INITIAL IMPRESSION / ASSESSMENT AND PLAN / ED COURSE  Pertinent labs & imaging results that were available during my care of the patient were reviewed by me and considered in my medical decision making (see chart for details).  Patient presents after syncopal episode.  Differential includes vasovagal syncope, electrolyte abnormalities, arrhythmia.  We will obtain labs, EKG place on the cardiac monitor give IV fluids check orthostatics and reevaluate  Patient is labs are overall unremarkable, mild anemia appears chronic.  Dehydration noted as well.  But otherwise electrolytes are reassuring.  Pending orthostatics  Orthostatics are unremarkable, the patient feels well, no further lightheadedness.  Appropriate for discharge at this time with outpatient follow-up with PCP    ____________________________________________   FINAL CLINICAL IMPRESSION(S) / ED DIAGNOSES   Final diagnoses:  Syncope and collapse        Note:  This document was prepared using Dragon voice recognition software and may include unintentional dictation errors.   Lavonia Drafts, MD 08/24/18 1308

## 2018-08-29 ENCOUNTER — Encounter: Payer: Self-pay | Admitting: Internal Medicine

## 2018-09-05 ENCOUNTER — Encounter: Payer: Self-pay | Admitting: Internal Medicine

## 2018-09-05 ENCOUNTER — Other Ambulatory Visit: Payer: Self-pay

## 2018-09-05 ENCOUNTER — Ambulatory Visit (INDEPENDENT_AMBULATORY_CARE_PROVIDER_SITE_OTHER): Payer: No Typology Code available for payment source | Admitting: Internal Medicine

## 2018-09-05 VITALS — BP 122/76 | HR 97 | Temp 97.8°F | Ht 71.0 in | Wt 172.4 lb

## 2018-09-05 DIAGNOSIS — K6289 Other specified diseases of anus and rectum: Secondary | ICD-10-CM | POA: Diagnosis not present

## 2018-09-05 DIAGNOSIS — R600 Localized edema: Secondary | ICD-10-CM | POA: Diagnosis not present

## 2018-09-05 DIAGNOSIS — Z9884 Bariatric surgery status: Secondary | ICD-10-CM

## 2018-09-05 DIAGNOSIS — K649 Unspecified hemorrhoids: Secondary | ICD-10-CM

## 2018-09-05 DIAGNOSIS — R55 Syncope and collapse: Secondary | ICD-10-CM | POA: Diagnosis not present

## 2018-09-05 DIAGNOSIS — J309 Allergic rhinitis, unspecified: Secondary | ICD-10-CM | POA: Diagnosis not present

## 2018-09-05 DIAGNOSIS — K811 Chronic cholecystitis: Secondary | ICD-10-CM

## 2018-09-05 MED ORDER — FLUTICASONE PROPIONATE 50 MCG/ACT NA SUSP: 2.0000 | Freq: Every day | NASAL | 12 refills | Status: DC

## 2018-09-05 MED ORDER — TRIAMTERENE-HCTZ 37.5-25 MG PO TABS: 1.0000 | ORAL_TABLET | Freq: Every day | ORAL | 3 refills | Status: DC

## 2018-09-05 NOTE — Patient Instructions (Addendum)
Anal Fissure, Adult  An anal fissure is a small tear or crack in the tissue of the anus. Bleeding from a fissure usually stops on its own within a few minutes. However, bleeding will often occur again with each bowel movement until the fissure heals. What are the causes? This condition is usually caused by passing a large or hard stool (feces). Other causes include:  Constipation.  Frequent diarrhea.  Inflammatory bowel disease (Crohn's disease or ulcerative colitis).  Childbirth.  Infections.  Anal sex. What are the signs or symptoms? Symptoms of this condition include:  Bleeding from the rectum.  Small amounts of blood seen on your stool, on the toilet paper, or in the toilet after a bowel movement. The blood coats the outside of the stool and is not mixed with the stool.  Painful bowel movements.  Itching or irritation around the anus. How is this diagnosed? A health care provider may diagnose this condition by closely examining the anal area. An anal fissure can usually be seen with careful inspection. In some cases, a rectal exam may be performed, or a short tube (anoscope) may be used to examine the anal canal. How is this treated? Initial treatment for this condition may include:  Taking steps to avoid constipation. This may include making changes to your diet, such as increasing your intake of fiber or fluid.  Taking fiber supplements. These supplements can soften your stool to help make bowel movements easier. Your health care provider may also prescribe a stool softener if your stool is hard.  Taking sitz baths. This may help to heal the tear.  Using medicated creams or ointments. These may be prescribed to lessen discomfort. Treatments that are sometimes used if initial treatments do not work well or if the condition is more severe may include:  Botulinum injection.  Surgery to repair the fissure. Follow these instructions at home: Eating and drinking    Avoid foods that may cause constipation, such as bananas, milk, and other dairy products.  Eat all fruits, except bananas.  Drink enough fluid to keep your urine pale yellow.  Eat foods that are high in fiber, such as beans, whole grains, and fresh fruits and vegetables. General instructions   Take over-the-counter and prescription medicines only as told by your health care provider.  Use creams or ointments only as told by your health care provider.  Keep the anal area clean and dry.  Take sitz baths as told by your health care provider. Do not use soap in the sitz baths.  Keep all follow-up visits as told by your health care provider. This is important. Contact a health care provider if you have:  More bleeding.  A fever.  Diarrhea that is mixed with blood.  Pain that continues.  Ongoing problems that are getting worse rather than better. Summary  An anal fissure is a small tear or crack in the tissue of the anus. This condition is usually caused by passing a large or hard stool (feces). Other causes include constipation and frequent diarrhea.  Initial treatment for this condition may include taking steps to avoid constipation, such as increasing your intake of fiber or fluid.  Follow instructions for care as told by your health care provider.  Contact your health care provider if you have more bleeding or your problem is getting worse rather than better.  Keep all follow-up visits as told by your health care provider. This is important. This information is not intended to replace advice  given to you by your health care provider. Make sure you discuss any questions you have with your health care provider. Document Released: 05/30/2005 Document Revised: 11/09/2017 Document Reviewed: 11/09/2017 Elsevier Interactive Patient Education  2019 [employer]  Hemorrhoids Hemorrhoids are swollen veins in and around the rectum or anus. There are two types of hemorrhoids:   Internal hemorrhoids. These occur in the veins that are just inside the rectum. They may poke through to the outside and become irritated and painful.  External hemorrhoids. These occur in the veins that are outside the anus and can be felt as a painful swelling or hard lump near the anus. Most hemorrhoids do not cause serious problems, and they can be managed with home treatments such as diet and lifestyle changes. If home treatments do not help the symptoms, procedures can be done to shrink or remove the hemorrhoids. What are the causes? This condition is caused by increased pressure in the anal area. This pressure may result from various things, including:  Constipation.  Straining to have a bowel movement.  Diarrhea.  Pregnancy.  Obesity.  Sitting for long periods of time.  Heavy lifting or other activity that causes you to strain.  Anal sex.  Riding a bike for a long period of time. What are the signs or symptoms? Symptoms of this condition include:  Pain.  Anal itching or irritation.  Rectal bleeding.  Leakage of stool (feces).  Anal swelling.  One or more lumps around the anus. How is this diagnosed? This condition can often be diagnosed through a visual exam. Other exams or tests may also be done, such as:  An exam that involves feeling the rectal area with a gloved hand (digital rectal exam).  An exam of the anal canal that is done using a small tube (anoscope).  A blood test, if you have lost a significant amount of blood.  A test to look inside the colon using a flexible tube with a camera on the end (sigmoidoscopy or colonoscopy). How is this treated? This condition can usually be treated at home. However, various procedures may be done if dietary changes, lifestyle changes, and other home treatments do not help your symptoms. These procedures can help make the hemorrhoids smaller or remove them completely. Some of these procedures involve surgery, and  others do not. Common procedures include:  Rubber band ligation. Rubber bands are placed at the base of the hemorrhoids to cut off their blood supply.  Sclerotherapy. Medicine is injected into the hemorrhoids to shrink them.  Infrared coagulation. A type of light energy is used to get rid of the hemorrhoids.  Hemorrhoidectomy surgery. The hemorrhoids are surgically removed, and the veins that supply them are tied off.  Stapled hemorrhoidopexy surgery. The surgeon staples the base of the hemorrhoid to the rectal wall. Follow these instructions at home: Eating and drinking   Eat foods that have a lot of fiber in them, such as whole grains, beans, nuts, fruits, and vegetables.  Ask your health care provider about taking products that have added fiber (fiber supplements).  Reduce the amount of fat in your diet. You can do this by eating low-fat dairy products, eating less red meat, and avoiding processed foods.  Drink enough fluid to keep your urine pale yellow. Managing pain and swelling   Take warm sitz baths for 20 minutes, 3-4 times a day to ease pain and discomfort. You may do this in a bathtub or using a portable sitz bath that fits  over the toilet.  If directed, apply ice to the affected area. Using ice packs between sitz baths may be helpful. ? Put ice in a plastic bag. ? Place a towel between your skin and the bag. ? Leave the ice on for 20 minutes, 2-3 times a day. General instructions  Take over-the-counter and prescription medicines only as told by your health care provider.  Use medicated creams or suppositories as told.  Get regular exercise. Ask your health care provider how much and what kind of exercise is best for you. In general, you should do moderate exercise for at least 30 minutes on most days of the week (150 minutes each week). This can include activities such as walking, biking, or yoga.  Go to the bathroom when you have the urge to have a bowel movement.  Do not wait.  Avoid straining to have bowel movements.  Keep the anal area dry and clean. Use wet toilet paper or moist towelettes after a bowel movement.  Do not sit on the toilet for long periods of time. This increases blood pooling and pain.  Keep all follow-up visits as told by your health care provider. This is important. Contact a health care provider if you have:  Increasing pain and swelling that are not controlled by treatment or medicine.  Difficulty having a bowel movement, or you are unable to have a bowel movement.  Pain or inflammation outside the area of the hemorrhoids. Get help right away if you have:  Uncontrolled bleeding from your rectum. Summary  Hemorrhoids are swollen veins in and around the rectum or anus.  Most hemorrhoids can be managed with home treatments such as diet and lifestyle changes.  Taking warm sitz baths can help ease pain and discomfort.  In severe cases, procedures or surgery can be done to shrink or remove the hemorrhoids. This information is not intended to replace advice given to you by your health care provider. Make sure you discuss any questions you have with your health care provider. Document Released: 05/27/2000 Document Revised: 10/19/2017 Document Reviewed: 10/19/2017 Elsevier Interactive Patient Education  2019 [employer].  Syncope  Syncope refers to a condition in which a person temporarily loses consciousness. Syncope may also be called fainting or passing out. It is caused by a sudden decrease in blood flow to the brain. Even though most causes of syncope are not dangerous, syncope can be a sign of a serious medical problem. Your health care provider may do tests to find the reason why you are having syncope. Signs that you may be about to faint include:  Feeling dizzy or light-headed.  Feeling nauseous.  Seeing all white or all black in your field of vision.  Having cold, clammy skin. If you faint, get medical  help right away. Call your local emergency services (911 in the [place] Do not drive yourself to the hospital. Follow these instructions at home: Pay attention to any changes in your symptoms. Take these actions to stay safe and to help relieve your symptoms: Lifestyle  Do not drive, use machinery, or play sports until your health care provider says it is okay.  Do not drink alcohol.  Do not use any products that contain nicotine or tobacco, such as cigarettes and e-cigarettes. If you need help quitting, ask your health care provider.  Drink enough fluid to keep your urine pale yellow. General instructions  Take over-the-counter and prescription medicines only as told by your health care provider.  If you are  taking blood pressure or heart medicine, get up slowly and take several minutes to sit and then stand. This can reduce dizziness or light-headedness.  Have someone stay with you until you feel stable.  If you start to feel like you might faint, lie down right away and raise (elevate) your feet above the level of your heart. Breathe deeply and steadily. Wait until all the symptoms have passed.  Keep all follow-up visits as told by your health care provider. This is important. Get help right away if you:  Have a severe headache.  Faint once or repeatedly.  Have pain in your chest, abdomen, or back.  Have a very fast or irregular heartbeat (palpitations).  Have pain when you breathe.  Are bleeding from your mouth or rectum, or you have black or tarry stool.  Have a seizure.  Are confused.  Have trouble walking.  Have severe weakness.  Have vision problems. These symptoms may represent a serious problem that is an emergency. Do not wait to see if your symptoms will go away. Get medical help right away. Call your local emergency services (911 in the [place] Do not drive yourself to the hospital. Summary  Syncope refers to a condition in which a person temporarily loses  consciousness. It is caused by a sudden decrease in blood flow to the brain.  Signs that you may be about to faint include dizziness, feeling light-headed, feeling nauseous, sudden vision changes, or cold, clammy skin.  Although most causes of syncope are not dangerous, syncope can be a sign of a serious medical problem. If you faint, get medical help right away. This information is not intended to replace advice given to you by your health care provider. Make sure you discuss any questions you have with your health care provider. Document Released: 05/30/2005 Document Revised: 05/08/2017 Document Reviewed: 05/08/2017 Elsevier Interactive Patient Education  2019 [employer].

## 2018-09-05 NOTE — Progress Notes (Signed)
Pre visit review using our clinic review tool, if applicable. No additional management support is needed unless otherwise documented below in the visit note. 

## 2018-09-05 NOTE — Progress Notes (Signed)
Chief Complaint  Patient presents with  . Follow-up   ED f/u from 08/24/18  1. Syncope at work felt hot/sweaty before he passed out witnessed at work by Art therapist this is 2nd time w/in the last year he has passed out last time was Jan 02, 2018. He also reports he was dizzy before he passed out and had eat a protein shake and some crackers cbg was 107. He does drink plenty of water he also was taking maxzide 75-50 qod for chronic leg edema since gastric bypass  2. C/o rectal pain and hemorrhoids doing epsolm salt soaks and prep H pads  3. 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 Kaylyn Lim [facility]  4. Allergies needs refill of Flonase    Review of Systems  Constitutional: Negative for weight loss.  HENT: Negative for hearing loss.   Eyes: Negative for blurred vision.  Respiratory: Negative for shortness of breath.   Cardiovascular: Negative for leg swelling.  Gastrointestinal: Negative for abdominal pain.       +rectal pain    Genitourinary:       Rectal pain    Skin: Negative for rash.  Neurological: Positive for dizziness and loss of consciousness.       Dizziness resolved    Endo/Heme/Allergies: Positive for environmental allergies.  Psychiatric/Behavioral: Negative for depression.   Past Medical History:  Diagnosis Date  . Anemia, unspecified 02/22/2016  . Blood in stool   . Diarrhea   . Essential hypertension 12/30/2016  . Exocrine pancreatic insufficiency 10/15/2014  . Gastro-esophageal reflux 12/30/2016  . Inguinal hernia   . Vitamin D deficiency 12/30/2016   Past Surgical History:  Procedure Laterality Date  . BARIATRIC SURGERY  07/31/2013   sleeve gastrectomy with duodenal switch   Family History  Problem Relation Age of Onset  . Pancreatic disease Father   . Diabetes Father   . Hypertension Father   . Diabetes Mother   . Hypertension Mother   . Hypertension Brother        ?  . Prostate cancer Neg Hx   .  Kidney cancer Neg Hx   . Bladder Cancer Neg Hx    Social History   Socioeconomic History  . Marital status: Married    Spouse name: Not on file  . Number of children: Not on file  . Years of education: Not on file  . Highest education level: Not on file  Occupational History  . Not on file  Social Needs  . Financial resource strain: Not on file  . Food insecurity:    Worry: Not on file    Inability: Not on file  . Transportation needs:    Medical: Not on file    Non-medical: Not on file  Tobacco Use  . Smoking status: Never Smoker  . Smokeless tobacco: Never Used  Substance and Sexual Activity  . Alcohol use: Yes    Frequency: Never    Comment: rarely  . Drug use: No  . Sexual activity: Yes  Lifestyle  . Physical activity:    Days per week: Not on file    Minutes per session: Not on file  . Stress: Not on file  Relationships  . Social connections:    Talks on phone: Not on file    Gets together: Not on file    Attends religious service: Not on file    Active member of club or organization: Not on file    Attends meetings  of clubs or organizations: Not on file    Relationship status: Not on file  . Intimate partner violence:    Fear of current or ex partner: Not on file    Emotionally abused: Not on file    Physically abused: Not on file    Forced sexual activity: Not on file  Other Topics Concern  . Not on file  Social History Narrative   Married    Works in [employer] [place]    No guns    Wears seat belt    Safe in relationship    Current Meds  Medication Sig  . Bismuth Subgallate (DEVROM PO) Take by mouth 3 (three) times daily.  . calcium carbonate (OS-CAL) 1250 (500 Ca) MG chewable tablet Chew by mouth.  . Cholecalciferol 1.25 MG (50000 UT) capsule Take 1 capsule (50,000 Units total) by mouth once a week.  Marland Kitchen CREON 36000 units CPEP capsule Take 4 capsules by mouth 4 (four) times daily. 4 capsules with meals 3x per day and 2-3 caps with snacks 2x per day  .  EPINEPHrine 0.3 mg/0.3 mL IJ SOAJ injection Inject 0.3 mLs (0.3 mg total) into the muscle once. Follow package instructions as needed for severe allergy or anaphylactic reaction.  . Ferrous Sulfate 134 MG TABS Take by mouth 2 (two) times daily. 10 mg 2x per day  . fluticasone (FLONASE) 50 MCG/ACT nasal spray Place 2 sprays into both nostrils daily.  . Multiple Vitamin (MULTI-VITAMINS) TABS Take 1 tablet by mouth daily.   Marland Kitchen oxymetazoline (AFRIN NASAL SPRAY) 0.05 % nasal spray Place 1 spray into both nostrils 2 (two) times daily.  . [DISCONTINUED] triamterene-hydrochlorothiazide (MAXZIDE) 75-50 MG tablet Take 1 tablet by mouth daily.   No Known Allergies Recent Results (from the past 2160 hour(s))  Comprehensive metabolic panel     Status: Abnormal   Collection Time: 07/03/18  3:37 PM  Result Value Ref Range   Sodium 138 135 - 145 mEq/L   Potassium 3.9 3.5 - 5.1 mEq/L   Chloride 107 96 - 112 mEq/L   CO2 23 19 - 32 mEq/L   Glucose, Bld 78 70 - 99 mg/dL   BUN 19 6 - 23 mg/dL   Creatinine, Ser 1.04 0.40 - 1.50 mg/dL   Total Bilirubin 1.2 0.2 - 1.2 mg/dL   Alkaline Phosphatase 205 (H) 39 - 117 U/L   AST 49 (H) 0 - 37 U/L   ALT 59 (H) 0 - 53 U/L   Total Protein 7.7 6.0 - 8.3 g/dL   Albumin 4.7 3.5 - 5.2 g/dL   Calcium 9.3 8.4 - 10.5 mg/dL   GFR 100.73 >60.00 mL/min  CBC with Differential/Platelet     Status: Abnormal   Collection Time: 07/03/18  3:37 PM  Result Value Ref Range   WBC 6.5 4.0 - 10.5 K/uL   RBC 3.65 (L) 4.22 - 5.81 Mil/uL   Hemoglobin 10.8 (L) 13.0 - 17.0 g/dL   HCT 33.2 (L) 39.0 - 52.0 %   MCV 90.9 78.0 - 100.0 fl   MCHC 32.5 30.0 - 36.0 g/dL   RDW 15.1 11.5 - 15.5 %   Platelets 216.0 150.0 - 400.0 K/uL   Neutrophils Relative % 54.3 43.0 - 77.0 %   Lymphocytes Relative 38.6 12.0 - 46.0 %   Monocytes Relative 5.5 3.0 - 12.0 %   Eosinophils Relative 0.8 0.0 - 5.0 %   Basophils Relative 0.8 0.0 - 3.0 %   Neutro Abs 3.5 1.4 - 7.7 K/uL  Lymphs Abs 2.5 0.7 - 4.0 K/uL    Monocytes Absolute 0.4 0.1 - 1.0 K/uL   Eosinophils Absolute 0.1 0.0 - 0.7 K/uL   Basophils Absolute 0.1 0.0 - 0.1 K/uL  Iron, TIBC and Ferritin Panel     Status: None   Collection Time: 07/03/18  3:37 PM  Result Value Ref Range   Iron 78 50 - 180 mcg/dL   TIBC 337 250 - 425 mcg/dL (calc)   %SAT 23 20 - 48 % (calc)   Ferritin 332 38 - 380 ng/mL  T4, free     Status: None   Collection Time: 07/03/18  3:37 PM  Result Value Ref Range   Free T4 0.77 0.60 - 1.60 ng/dL    Comment: Specimens from patients who are undergoing biotin therapy and /or ingesting biotin supplements may contain high levels of biotin.  The higher biotin concentration in these specimens interferes with this Free T4 assay.  Specimens that contain high levels  of biotin may cause false high results for this Free T4 assay.  Please interpret results in light of the total clinical presentation of the patient.    TSH     Status: None   Collection Time: 07/03/18  3:37 PM  Result Value Ref Range   TSH 3.29 0.35 - 4.50 uIU/mL  Urinalysis, Routine w reflex microscopic     Status: None   Collection Time: 07/03/18  3:37 PM  Result Value Ref Range   Color, Urine YELLOW YELLOW   APPearance CLEAR CLEAR   Specific Gravity, Urine 1.020 1.001 - 1.03   pH 6.0 5.0 - 8.0   Glucose, UA NEGATIVE NEGATIVE   Bilirubin Urine NEGATIVE NEGATIVE   Ketones, ur NEGATIVE NEGATIVE   Hgb urine dipstick NEGATIVE NEGATIVE   Protein, ur NEGATIVE NEGATIVE   Nitrite NEGATIVE NEGATIVE   Leukocytes, UA NEGATIVE NEGATIVE  B12     Status: None   Collection Time: 07/03/18  3:37 PM  Result Value Ref Range   Vitamin B-12 899 211 - 911 pg/mL  Vitamin D (25 hydroxy)     Status: Abnormal   Collection Time: 07/03/18  3:37 PM  Result Value Ref Range   VITD 13.70 (L) 30.00 - 100.00 ng/mL  Lipase     Status: Abnormal   Collection Time: 07/03/18  3:37 PM  Result Value Ref Range   Lipase 65.0 (H) 11.0 - 59.0 U/L  Measles/Mumps/Rubella Immunity     Status:  None   Collection Time: 07/03/18  3:45 PM  Result Value Ref Range   Rubeola IgG 84.00 AU/mL    Comment: AU/mL            Interpretation -----            -------------- <13.50           Negative 13.50-16.49      Equivocal >16.49           Positive . A positive result indicates that the patient has antibody to measles virus. It does not differentiate  between an active or past infection. The clinical  diagnosis must be interpreted in conjunction with  clinical signs and symptoms of the patient.    Mumps IgG 93.50 AU/mL    Comment:  AU/mL           Interpretation -------         ---------------- <9.00             Negative 9.00-10.99  Equivocal >10.99            Positive A positive result indicates that the patient has  antibody to mumps virus. It does not differentiate between an  active or past infection. The clinical diagnosis must be interpreted in conjunction with clinical signs and symptoms of the patient. .    Rubella 5.75 index    Comment:     Index            Interpretation     -----            --------------       <0.90            Not consistent with Immunity     0.90-0.99        Equivocal     > or = 1.00      Consistent with Immunity  . The presence of rubella IgG antibody suggests  immunization or past or current infection with rubella virus.   Hepatitis B surface antibody,quantitative     Status: Abnormal   Collection Time: 07/03/18  3:45 PM  Result Value Ref Range   Hepatitis B-Post 6 (L) > OR = 10 mIU/mL    Comment: . Patient does not have immunity to hepatitis B virus. . For additional information, please refer to http://education.questdiagnostics.com/faq/FAQ105 (This link is being provided for informational/ educational purposes only).   POCT Influenza A/B     Status: Normal   Collection Time: 07/20/18  1:27 PM  Result Value Ref Range   Influenza A, POC Negative Negative   Influenza B, POC Negative Negative  Glucose, capillary     Status:  None   Collection Time: 08/24/18 10:27 AM  Result Value Ref Range   Glucose-Capillary 85 70 - 99 mg/dL  CBC     Status: Abnormal   Collection Time: 08/24/18 10:32 AM  Result Value Ref Range   WBC 12.2 (H) 4.0 - 10.5 K/uL   RBC 3.44 (L) 4.22 - 5.81 MIL/uL   Hemoglobin 9.9 (L) 13.0 - 17.0 g/dL   HCT 31.0 (L) 39.0 - 52.0 %   MCV 90.1 80.0 - 100.0 fL   MCH 28.8 26.0 - 34.0 pg   MCHC 31.9 30.0 - 36.0 g/dL   RDW 13.2 11.5 - 15.5 %   Platelets 225 150 - 400 K/uL   nRBC 0.0 0.0 - 0.2 %    Comment: Performed at [facility], [address] [place], [place] [address]  Comprehensive metabolic panel     Status: Abnormal   Collection Time: 08/24/18 10:32 AM  Result Value Ref Range   Sodium 138 135 - 145 mmol/L   Potassium 4.1 3.5 - 5.1 mmol/L   Chloride 108 98 - 111 mmol/L   CO2 21 (L) 22 - 32 mmol/L   Glucose, Bld 107 (H) 70 - 99 mg/dL   BUN 33 (H) 6 - 20 mg/dL   Creatinine, Ser 1.17 0.61 - 1.24 mg/dL   Calcium 9.3 8.9 - 10.3 mg/dL   Total Protein 7.7 6.5 - 8.1 g/dL   Albumin 4.2 3.5 - 5.0 g/dL   AST 35 15 - 41 U/L   ALT 40 0 - 44 U/L   Alkaline Phosphatase 164 (H) 38 - 126 U/L   Total Bilirubin 1.1 0.3 - 1.2 mg/dL   GFR calc non Af Amer >60 >60 mL/min   GFR calc Af Amer >60 >60 mL/min   Anion gap 9 5 - 15    Comment: Performed  at [facility], [address] [place], [place] [address]   Objective  Body mass index is 24.04 kg/m. Wt Readings from Last 3 Encounters:  09/05/18 172 lb 6.4 oz (78.2 kg)  08/24/18 165 lb (74.8 kg)  07/31/18 172 lb 3.2 oz (78.1 kg)   Temp Readings from Last 3 Encounters:  09/05/18 97.8 F (36.6 C) (Oral)  08/24/18 98 F (36.7 C) (Oral)  07/20/18 98 F (36.7 C)   BP Readings from Last 3 Encounters:  09/05/18 122/76  08/24/18 111/67  07/31/18 97/62   Pulse Readings from Last 3 Encounters:  09/05/18 97  08/24/18 75  07/31/18 90    Physical Exam Vitals signs and nursing note reviewed.  Constitutional:       Appearance: Normal appearance. He is well-developed and well-groomed.  HENT:     Head: Normocephalic and atraumatic.     Nose: Nose normal.     Mouth/Throat:     Mouth: Mucous membranes are moist.     Pharynx: Oropharynx is clear.  Eyes:     Conjunctiva/sclera: Conjunctivae normal.     Pupils: Pupils are equal, round, and reactive to light.  Cardiovascular:     Rate and Rhythm: Normal rate and regular rhythm.     Heart sounds: Normal heart sounds.     Comments: No leg edema b/l   Pulmonary:     Effort: Pulmonary effort is normal.     Breath sounds: Normal breath sounds.  Genitourinary:    Rectum: Tenderness, anal fissure and external hemorrhoid present.    Musculoskeletal:     Right lower leg: No edema.     Left lower leg: No edema.  Skin:    General: Skin is warm and dry.  Neurological:     General: No focal deficit present.     Mental Status: He is alert and oriented to person, place, and time. Mental status is at baseline.     Gait: Gait normal.  Psychiatric:        Attention and Perception: Attention and perception normal.        Mood and Affect: Mood and affect normal.        Speech: Speech normal.        Behavior: Behavior normal. Behavior is cooperative.        Thought Content: Thought content normal.        Cognition and Memory: Cognition and memory normal.        Judgment: Judgment normal.     Assessment   1. Syncope ? Vasovagal vs diuretic related though pt states dose 75/25 he was on of maxzide after he passed out 1st time 1 episode was 11/2017 and this 08/2018 of syncope r/o cardiac etiology Cause less likely due to hypoglycemia, anemia, hypoxia 2. Rectal pain and hemorrhoids  3. S/p gastric bypass surgery with complications and abnormal HIDA 4. HM 5. Allergic rhinitis  Plan   1.  Refer to cards further w/u  Cut maxzide 75/25 dose takes for chronic leg edema to 37.5/25 dose  2. Refer back to Dr. Johnathan Hausen to treat  3.  Pending surgery  10/2018 Dr.  Francia Greaves  4.  Flu shot utd  Consider Tdap in future  Consider hep B in future not immune  MMR immune   Hep C neg 09/19/17  HIV neg 02/14/17 5. Refilled Flonase  Provider: Dr. Olivia Mackie McLean-Scocuzza-Internal Medicine

## 2018-10-08 ENCOUNTER — Telehealth: Payer: Self-pay | Admitting: Cardiovascular Disease

## 2018-10-11 NOTE — Telephone Encounter (Signed)
Virtual Visit Pre-Appointment Phone Call  "(Name), I am calling you today to discuss your upcoming appointment. We are currently trying to limit exposure to the virus that causes COVID-19 by seeing patients at home rather than in the office."  1. "What is the BEST phone number to call the day of the visit?" - include this in appointment notes  2. Do you have or have access to (through a family member/friend) a smartphone with video capability that we can use for your visit?" a. If yes - list this number in appt notes as cell (if different from BEST phone #) and list the appointment type as a VIDEO visit in appointment notes b. If no - list the appointment type as a PHONE visit in appointment notes  3. Confirm consent - "In the setting of the current Covid19 crisis, you are scheduled for a (phone or video) visit with your provider on (date) at (time).  Just as we do with many in-office visits, in order for you to participate in this visit, we must obtain consent.  If you'd like, I can send this to your mychart (if signed up) or email for you to review.  Otherwise, I can obtain your verbal consent now.  All virtual visits are billed to your insurance company just like a normal visit would be.  By agreeing to a virtual visit, we'd like you to understand that the technology does not allow for your provider to perform an examination, and thus may limit your provider's ability to fully assess your condition. If your provider identifies any concerns that need to be evaluated in person, we will make arrangements to do so.  Finally, though the technology is pretty good, we cannot assure that it will always work on either your or our end, and in the setting of a video visit, we may have to convert it to a phone-only visit.  In either situation, we cannot ensure that we have a secure connection.  Are you willing to proceed?" STAFF: Did the patient verbally acknowledge consent to telehealth visit? Document  YES/NO here: YES  4. Advise patient to be prepared - "Two hours prior to your appointment, go ahead and check your blood pressure, pulse, oxygen saturation, and your weight (if you have the equipment to check those) and write them all down. When your visit starts, your provider will ask you for this information. If you have an Apple Watch or Kardia device, please plan to have heart rate information ready on the day of your appointment. Please have a pen and paper handy nearby the day of the visit as well."  5. Give patient instructions for MyChart download to smartphone OR [email or web address] as below if video visit (depending on what platform provider is using)  6. Inform patient they will receive a phone call 15 minutes prior to their appointment time (may be from unknown caller ID) so they should be prepared to answer    TELEPHONE CALL NOTE  Thomas Shaw has been deemed a candidate for a follow-up tele-health visit to limit community exposure during the Covid-19 pandemic. I spoke with the patient via phone to ensure availability of phone/video source, confirm preferred email & phone number, and discuss instructions and expectations.  I reminded Thomas Shaw to be prepared with any vital sign and/or heart rhythm information that could potentially be obtained via home monitoring, at the time of his visit. I reminded Thomas Shaw to expect a phone call prior to his visit.  Clarisse Gouge 10/11/2018 1:23 PM   INSTRUCTIONS FOR DOWNLOADING THE MYCHART APP TO SMARTPHONE  - The patient must first make sure to have activated MyChart and know their login information - If Apple, go to [employer] and type in MyChart in the search bar and download the app. If Android, ask patient to go to [employer] and type in [facility] in the search bar and download the app. The app is free but as with any other app downloads, their phone may require them to verify saved payment information or  Apple/Android password.  - The patient will need to then log into the app with their MyChart username and password, and select [facility] as their healthcare provider to link the account. When it is time for your visit, go to the MyChart app, find appointments, and click Begin Video Visit. Be sure to Select Allow for your device to access the Microphone and Camera for your visit. You will then be connected, and your provider will be with you shortly.  **If they have any issues connecting, or need assistance please contact MyChart service desk [phone])**  **If using a computer, in order to ensure the best quality for their visit they will need to use either of the following Internet Browsers: [employer], or Google Chrome**  IF USING DOXIMITY or DOXY.ME - The patient will receive a link just prior to their visit by text.     FULL LENGTH CONSENT FOR TELE-HEALTH VISIT   I hereby voluntarily request, consent and authorize [facility] and its employed or contracted physicians, physician assistants, nurse practitioners or other licensed health care professionals (the Practitioner), to provide me with telemedicine health care services (the Services") as deemed necessary by the treating Practitioner. I acknowledge and consent to receive the Services by the Practitioner via telemedicine. I understand that the telemedicine visit will involve communicating with the Practitioner through live audiovisual communication technology and the disclosure of certain medical information by electronic transmission. I acknowledge that I have been given the opportunity to request an in-person assessment or other available alternative prior to the telemedicine visit and am voluntarily participating in the telemedicine visit.  I understand that I have the right to withhold or withdraw my consent to the use of telemedicine in the course of my care at any time, without affecting my right to future care  or treatment, and that the Practitioner or I may terminate the telemedicine visit at any time. I understand that I have the right to inspect all information obtained and/or recorded in the course of the telemedicine visit and may receive copies of available information for a reasonable fee.  I understand that some of the potential risks of receiving the Services via telemedicine include:   Delay or interruption in medical evaluation due to technological equipment failure or disruption;  Information transmitted may not be sufficient (e.g. poor resolution of images) to allow for appropriate medical decision making by the Practitioner; and/or   In rare instances, security protocols could fail, causing a breach of personal health information.  Furthermore, I acknowledge that it is my responsibility to provide information about my medical history, conditions and care that is complete and accurate to the best of my ability. I acknowledge that Practitioner's advice, recommendations, and/or decision may be based on factors not within their control, such as incomplete or inaccurate data provided by me or distortions of diagnostic images or specimens that may result from electronic transmissions. I understand that the  practice of medicine is not an Chief Strategy Officer and that Practitioner makes no warranties or guarantees regarding treatment outcomes. I acknowledge that I will receive a copy of this consent concurrently upon execution via email to the email address I last provided but may also request a printed copy by calling the office of [facility].    I understand that my insurance will be billed for this visit.   I have read or had this consent read to me.  I understand the contents of this consent, which adequately explains the benefits and risks of the Services being provided via telemedicine.   I have been provided ample opportunity to ask questions regarding this consent and the Services and have had  my questions answered to my satisfaction.  I give my informed consent for the services to be provided through the use of telemedicine in my medical care  By participating in this telemedicine visit I agree to the above.

## 2018-10-14 DIAGNOSIS — R55 Syncope and collapse: Secondary | ICD-10-CM | POA: Insufficient documentation

## 2018-10-14 NOTE — Progress Notes (Signed)
Virtual Visit via Video Note   This visit type was conducted due to national recommendations for restrictions regarding the COVID-19 Pandemic (e.g. social distancing) in an effort to limit this patient's exposure and mitigate transmission in our community.  Due to his co-morbid illnesses, this patient is at least at moderate risk for complications without adequate follow up.  This format is felt to be most appropriate for this patient at this time.  All issues noted in this document were discussed and addressed.  A limited physical exam was performed with this format.  Please refer to the patient's chart for his consent to telehealth for [facility].   I connected with  Thomas Shaw on 10/15/18 by a video enabled telemedicine application and verified that I am speaking with the correct person using two identifiers. I discussed the limitations of evaluation and management by telemedicine. The patient expressed understanding and agreed to proceed.   Evaluation Performed:  Follow-up visit  Date:  10/15/2018   ID:  Thomas Shaw, DOB 1985-09-18, MRN [id number]  Patient Location:  [place] [address]   Provider location:   [facility], [place] office  PCP:  McLean-Scocuzza, Nino Glow, MD  Cardiologist:  Patsy Baltimore   Chief Complaint:  syncope    History of Present Illness:    Thomas Shaw is a 33 y.o. male who presents via audio/video conferencing for a telehealth visit today.   The patient does not symptoms concerning for COVID-19 infection (fever, chills, cough, or new SHORTNESS OF BREATH).   Patient has a past medical history of Syncopal event August 24, 2018, with prior episodes Bariatric surgery Anemia, chronic Chronic diarrhea Who presents by referral from Dr. Olivia Mackie McLean-Scocuza for consultation of his syncope  Hospital employee, syncopized at the coffee station Acute lightheadedness, hot/sweaty before he passed out Reports  having this happened once before about a year and a half ago with similar circumstances.  Blood pressure in the emergency room 110/63 heart rate 81 BUN 33, creatinine 1.17 Hemoglobin 9.9  taking maxzide 75-50 qod for chronic leg edema since gastric bypass   abnormal HIDA 08/23/2018 surgery scheduled  10/2018   He is on triamterene HCTZ 75/50 , daily Recently dropped down to half dose 37.5/25  CT scan abdomen cholelithiasis and hernia repair   Notes indicating chronic diarrhea Waxes with normal bowel movements  Notes reviewed showing witnessed syncope March 2019 At that time reported he was not taking his vitamins  One iron pill a day  Reports he continues to have hemorrhoid Chronic blood in stool Feels that is how he is losing his blood  In general he reports the leg swelling is not that bad on his current medication of triamterene HCT Wearing his favorite shoes, not much difficulty getting them on    Prior CV studies:   The following studies were reviewed today:  Echo 04/2018 NORMAL LEFT VENTRICULAR SYSTOLIC FUNCTION NORMAL RIGHT VENTRICULAR SYSTOLIC FUNCTION MILD VALVULAR REGURGITATION (See above) NO VALVULAR STENOSIS EF 55-60%  Past Medical History:  Diagnosis Date  . Anemia, unspecified 02/22/2016  . Blood in stool   . Diarrhea   . Essential hypertension 12/30/2016  . Exocrine pancreatic insufficiency 10/15/2014  . Gastro-esophageal reflux 12/30/2016  . Inguinal hernia   . Vitamin D deficiency 12/30/2016   Past Surgical History:  Procedure Laterality Date  . BARIATRIC SURGERY  07/31/2013   sleeve gastrectomy with duodenal switch     No outpatient medications have been  marked as taking for the 10/15/18 encounter (Appointment) with Minna Merritts, MD.     Allergies:   Patient has no known allergies.   Social History   Tobacco Use  . Smoking status: Never Smoker  . Smokeless tobacco: Never Used  Substance Use Topics  . Alcohol use: Yes    Frequency:  Never    Comment: rarely  . Drug use: No     Current Outpatient Medications on File Prior to Visit  Medication Sig Dispense Refill  . Bismuth Subgallate (DEVROM PO) Take by mouth 3 (three) times daily.    . calcium carbonate (OS-CAL) 1250 (500 Ca) MG chewable tablet Chew by mouth.    . Cholecalciferol 1.25 MG (50000 UT) capsule Take 1 capsule (50,000 Units total) by mouth once a week. 13 capsule 1  . CREON 36000 units CPEP capsule Take 4 capsules by mouth 4 (four) times daily. 4 capsules with meals 3x per day and 2-3 caps with snacks 2x per day  11  . EPINEPHrine 0.3 mg/0.3 mL IJ SOAJ injection Inject 0.3 mLs (0.3 mg total) into the muscle once. Follow package instructions as needed for severe allergy or anaphylactic reaction. 1 Device 2  . Ferrous Sulfate 134 MG TABS Take by mouth 2 (two) times daily. 10 mg 2x per day    . fluticasone (FLONASE) 50 MCG/ACT nasal spray Place 2 sprays into both nostrils daily. 16 g 12  . Multiple Vitamin (MULTI-VITAMINS) TABS Take 1 tablet by mouth daily.     Marland Kitchen oxymetazoline (AFRIN NASAL SPRAY) 0.05 % nasal spray Place 1 spray into both nostrils 2 (two) times daily. 30 mL 0  . triamterene-hydrochlorothiazide (MAXZIDE-25) 37.5-25 MG tablet Take 1 tablet by mouth daily. 90 tablet 3   No current facility-administered medications on file prior to visit.      Family Hx: The patient's family history includes Diabetes in his father and mother; Hypertension in his brother, father, and mother; Pancreatic disease in his father. There is no history of Prostate cancer, Kidney cancer, or Bladder Cancer.  ROS:   Please see the history of present illness.    Review of Systems  Constitutional: Negative.   Respiratory: Negative.   Cardiovascular: Negative.   Gastrointestinal: Negative.   Musculoskeletal: Negative.   Neurological: Positive for loss of consciousness.  Psychiatric/Behavioral: Negative.   All other systems reviewed and are negative.     Labs/Other  Tests and Data Reviewed:    Recent Labs: 07/03/2018: TSH 3.29 08/24/2018: ALT 40; BUN 33; Creatinine, Ser 1.17; Hemoglobin 9.9; Platelets 225; Potassium 4.1; Sodium 138   Recent Lipid Panel No results found for: CHOL, TRIG, HDL, CHOLHDL, LDLCALC, LDLDIRECT  Wt Readings from Last 3 Encounters:  09/05/18 172 lb 6.4 oz (78.2 kg)  08/24/18 165 lb (74.8 kg)  07/31/18 172 lb 3.2 oz (78.1 kg)     Exam:    Vital Signs: Vital signs may also be detailed in the HPI There were no vitals taken for this visit.  Wt Readings from Last 3 Encounters:  09/05/18 172 lb 6.4 oz (78.2 kg)  08/24/18 165 lb (74.8 kg)  07/31/18 172 lb 3.2 oz (78.1 kg)   Temp Readings from Last 3 Encounters:  09/05/18 97.8 F (36.6 C) (Oral)  08/24/18 98 F (36.7 C) (Oral)  07/20/18 98 F (36.7 C)   BP Readings from Last 3 Encounters:  09/05/18 122/76  08/24/18 111/67  07/31/18 97/62   Pulse Readings from Last 3 Encounters:  09/05/18 97  08/24/18  75  07/31/18 90    110/60, pulse 90, respirations 16  Well nourished, well developed male in no acute distress. Constitutional:  oriented to person, place, and time. No distress.  Head: Normocephalic and atraumatic.  Eyes:  no discharge. No scleral icterus.  Neck: Normal range of motion. Neck supple.  Pulmonary/Chest: No audible wheezing, no distress, appears comfortable Musculoskeletal: Normal range of motion.  no  tenderness or deformity.  Neurological:   Coordination normal. Full exam not performed Skin:  No rash Psychiatric:  normal mood and affect. behavior is normal. Thought content normal.    ASSESSMENT & PLAN:    Syncope and collapse Recent episodes reviewed, one recently and prior 1 in early 2019 Hospital records reviewed, Suspect most consistent with orthostasis, drops in pressure Most recent lab work elevated BUN, elevated WBC consistent with prerenal state Often will have days with significant diarrhea Has been on high-dose diuretic Long busy  days at work, often does not have high fluid intake -He has cut back on his triamterene HCTZ in half Does not feel he is having significant leg swelling Recommend he try to take this low-dose diuretic every other day, perhaps even wean off and take as needed -I suspect he does not have fluid overload but rather venous insufficiency Suggested he also try compression hose in an effort to wean down on the medication Leg elevation when sitting Suggested he try to stay more hydrated especially when at work and in hot weather especially with days with diarrhea  Chronic diarrhea Waxing waning diarrhea He has had this since gastric bypass surgery On supplemental vitamins  Iron deficiency anemia due to chronic blood loss Chronic blood loss from hemorrhoid Recent blood work in the hospital he appeared dehydrated with elevated BUN Suspect blood count may be lower than actually estimated given hemoconcentration He is scheduled to see primary care this week, perhaps CBC could be repeated in preparation for upcoming surgeries this month  Orthostasis Recommend he monitor orthostatics, try to wean down on the HCTZ, stay hydrated, compression hose for leg swelling, need to work on anemia  Hemorrhoids, unspecified hemorrhoid type He has been seen in the past by GI Given chronic blood loss may need banding of his hemorrhoid if possible  S/P gastric surgery Fluid loss with diarrhea, On supplemental iron and vitamins, Creon   Patient was seen in consultation for Dr. Tonita Phoenix and will be referred back to her office for ongoing care of the issues detailed above  COVID-19 Education: The signs and symptoms of COVID-19 were discussed with the patient and how to seek care for testing (follow up with PCP or arrange E-visit).  The importance of social distancing was discussed today.  Patient Risk:   After full review of this patients clinical status, I feel that they are at least moderate risk at  this time.  Time:   Today, I have spent 60 minutes with the patient with telehealth technology discussing the cardiac and medical problems/diagnoses detailed above   10 min spent reviewing the chart prior to patient visit today   Medication Adjustments/Labs and Tests Ordered: Current medicines are reviewed at length with the patient today.  Concerns regarding medicines are outlined above.   Tests Ordered: No tests ordered   Medication Changes: No changes made   Disposition: Follow-up in 6 months   Signed, Ida Rogue, MD  10/15/2018 3:46 PM    [place] Office [address] [place] [address], [place], [place] [address]

## 2018-10-15 ENCOUNTER — Other Ambulatory Visit: Payer: Self-pay

## 2018-10-15 ENCOUNTER — Telehealth (INDEPENDENT_AMBULATORY_CARE_PROVIDER_SITE_OTHER): Payer: No Typology Code available for payment source | Admitting: Cardiovascular Disease

## 2018-10-15 DIAGNOSIS — D500 Iron deficiency anemia secondary to blood loss (chronic): Secondary | ICD-10-CM | POA: Diagnosis not present

## 2018-10-15 DIAGNOSIS — K529 Noninfective gastroenteritis and colitis, unspecified: Secondary | ICD-10-CM

## 2018-10-15 DIAGNOSIS — R55 Syncope and collapse: Secondary | ICD-10-CM | POA: Diagnosis not present

## 2018-10-15 DIAGNOSIS — Z9889 Other specified postprocedural states: Secondary | ICD-10-CM

## 2018-10-15 DIAGNOSIS — K649 Unspecified hemorrhoids: Secondary | ICD-10-CM

## 2018-10-15 DIAGNOSIS — I951 Orthostatic hypotension: Secondary | ICD-10-CM

## 2018-10-15 HISTORY — DX: Iron deficiency anemia secondary to blood loss (chronic): D50.0

## 2018-10-15 NOTE — Patient Instructions (Signed)
Medication Instructions:  Please decrease the triamterene HCTZ down to every other day If no dramatic increase in lower extremity edema would take the medication as needed  Compression hose for leg swelling  Follow-up with primary care for anemia, hemorrhoids  If you need a refill on your cardiac medications before your next appointment, please call your pharmacy.    Lab work: No new labs needed   If you have labs (blood work) drawn today and your tests are completely normal, you will receive your results only by: Marland Kitchen MyChart Message (if you have MyChart) OR . A paper copy in the mail If you have any lab test that is abnormal or we need to change your treatment, we will call you to review the results.   Testing/Procedures: No new testing needed   Follow-Up: At [facility], you and your health needs are our priority.  As part of our continuing mission to provide you with exceptional heart care, we have created designated Provider Care Teams.  These Care Teams include your primary Cardiologist (physician) and Advanced Practice Providers (APPs -  Physician Assistants and Nurse Practitioners) who all work together to provide you with the care you need, when you need it.  . You will need a follow up appointment as needed  . Providers on your designated Care Team:   . Murray Hodgkins, NP . Christell Faith, PA-C . Marrianne Mood, PA-C  Any Other Special Instructions Will Be Listed Below (If Applicable).  For educational health videos Log in to : [email or web address] Or : [email or web address], password : [email or web address]

## 2018-10-15 NOTE — Progress Notes (Signed)
Thank you will review with him upcoming   Take care and be safe  [place]

## 2018-10-17 ENCOUNTER — Ambulatory Visit (INDEPENDENT_AMBULATORY_CARE_PROVIDER_SITE_OTHER): Payer: No Typology Code available for payment source | Admitting: Internal Medicine

## 2018-10-17 ENCOUNTER — Other Ambulatory Visit: Payer: Self-pay

## 2018-10-17 ENCOUNTER — Telehealth: Payer: Self-pay | Admitting: Internal Medicine

## 2018-10-17 DIAGNOSIS — K649 Unspecified hemorrhoids: Secondary | ICD-10-CM

## 2018-10-17 DIAGNOSIS — R600 Localized edema: Secondary | ICD-10-CM | POA: Diagnosis not present

## 2018-10-17 DIAGNOSIS — K625 Hemorrhage of anus and rectum: Secondary | ICD-10-CM | POA: Diagnosis not present

## 2018-10-17 DIAGNOSIS — I951 Orthostatic hypotension: Secondary | ICD-10-CM | POA: Diagnosis not present

## 2018-10-17 MED ORDER — TRIAMTERENE-HCTZ 37.5-25 MG PO TABS: 1.0000 | ORAL_TABLET | Freq: Every day | ORAL | 3 refills | Status: DC | PRN

## 2018-10-17 NOTE — Progress Notes (Signed)
Virtual Visit via Video Note  I connected with Thomas Shaw   on 10/17/18 at  3:14 PM EDT by a video enabled telemedicine application and verified that I am speaking with the correct person using two identifiers.  Location patient: home Location provider:work or home office Persons participating in the virtual visit: patient, provider  I discussed the limitations of evaluation and management by telemedicine and the availability of in person appointments. The patient expressed understanding and agreed to proceed.   HPI: 1. orthostasis likely causing syncope stopped maxzide 37.5-25 x 2 days but still having dizzy spells and legs started to swell slightly again he is willing to try compression stockings and look into buying some online as rec by cardiology  2. Chronic leg edema he reports this was side effect of pancreatic enzyme Pancreaze which is why this was changed to creon reviewed creon and 3% chance or peripheral edema and he wants to know if anything else he can try will disc with GI Dr. Allen Norris reviewed zenpep, viokace, pertzye and they have similar side effect profile will ask GI  3. Rectal bleeding due to hemorrhoid and red blood from rectum at times will see if surgery can evaluate him for this and possibly do surgery/procedure to help if bleeding hemorrhoids    ROS: See pertinent positives and negatives per HPI.  Past Medical History:  Diagnosis Date  . Anemia, unspecified 02/22/2016  . Blood in stool   . Diarrhea   . Essential hypertension 12/30/2016  . Exocrine pancreatic insufficiency 10/15/2014  . Gastro-esophageal reflux 12/30/2016  . Inguinal hernia   . Vitamin D deficiency 12/30/2016    Past Surgical History:  Procedure Laterality Date  . BARIATRIC SURGERY  07/31/2013   sleeve gastrectomy with duodenal switch    Family History  Problem Relation Age of Onset  . Pancreatic disease Father   . Diabetes Father   . Hypertension Father   . Diabetes Mother   .  Hypertension Mother   . Hypertension Brother        ?  . Prostate cancer Neg Hx   . Kidney cancer Neg Hx   . Bladder Cancer Neg Hx     SOCIAL HX: works for [employer], married   Current Outpatient Medications:  .  Bismuth Subgallate (DEVROM PO), Take by mouth 3 (three) times daily., Disp: , Rfl:  .  calcium carbonate (OS-CAL) 1250 (500 Ca) MG chewable tablet, Chew by mouth., Disp: , Rfl:  .  Cholecalciferol 1.25 MG (50000 UT) capsule, Take 1 capsule (50,000 Units total) by mouth once a week., Disp: 13 capsule, Rfl: 1 .  CREON 36000 units CPEP capsule, Take 4 capsules by mouth 4 (four) times daily. 4 capsules with meals 3x per day and 2-3 caps with snacks 2x per day, Disp: , Rfl: 11 .  EPINEPHrine 0.3 mg/0.3 mL IJ SOAJ injection, Inject 0.3 mLs (0.3 mg total) into the muscle once. Follow package instructions as needed for severe allergy or anaphylactic reaction., Disp: 1 Device, Rfl: 2 .  Ferrous Sulfate 134 MG TABS, Take by mouth 2 (two) times daily. 10 mg 2x per day, Disp: , Rfl:  .  fluticasone (FLONASE) 50 MCG/ACT nasal spray, Place 2 sprays into both nostrils daily., Disp: 16 g, Rfl: 12 .  Multiple Vitamin (MULTI-VITAMINS) TABS, Take 1 tablet by mouth daily. , Disp: , Rfl:  .  oxymetazoline (AFRIN NASAL SPRAY) 0.05 % nasal spray, Place 1 spray into both nostrils 2 (two) times daily., Disp: 30 mL,  Rfl: 0 .  triamterene-hydrochlorothiazide (MAXZIDE-25) 37.5-25 MG tablet, Take 1 tablet by mouth daily as needed., Disp: 90 tablet, Rfl: 3  EXAM:  VITALS per patient if applicable:  GENERAL: alert, oriented, appears well and in no acute distress  HEENT: atraumatic, conjunttiva clear, no obvious abnormalities on inspection of external nose and ears  NECK: normal movements of the head and neck  LUNGS: on inspection no signs of respiratory distress, breathing rate appears normal, no obvious gross SOB, gasping or wheezing  CV: no obvious cyanosis  MS: moves all visible extremities without  noticeable abnormality  PSYCH/NEURO: pleasant and cooperative, no obvious depression or anxiety, speech and thought processing grossly intact  ASSESSMENT AND PLAN:  Discussed the following assessment and plan:  Orthostasis-stay hydrated with water  Rx compression knee highs 15-20 mmhg mailed to pt and he will look for otc options as well   Leg edema - Plan: triamterene-hydrochlorothiazide (MAXZIDE-25) 37.5-25 MG tablet held x last 2 days disc'ed with pt rec take only prn for now consider in future 1/2 pill prn   Rectal bleeding Hemorrhoids, unspecified hemorrhoid type Pending GB removal and hernia repair will disc with Dr. Johnathan Hausen if he can see pt for possibly bleeding hemorrhoids and repair as well    Exocrine pancreatic insuff-will disc with Dr. Allen Norris if other options other than creon with less risk of peripheral edema Creon and meds in this class 3% chance of peripheral edema   HM Flu shot utd  Consider Tdap in future Consider hep B in future not immune  MMR immune  Hep C neg 09/19/17  HIV neg 02/14/17   I discussed the assessment and treatment plan with the patient. The patient was provided an opportunity to ask questions and all were answered. The patient agreed with the plan and demonstrated an understanding of the instructions.   The patient was advised to call back or seek an in-person evaluation if the symptoms worsen or if the condition fails to improve as anticipated.  Time spent 15 minutes  Delorise Jackson, MD

## 2018-10-17 NOTE — Telephone Encounter (Signed)
Pt needs surgery appt with Dr. Johnathan Hausen for rectal bleeding due to possibly hemorrhoid and wants possibly surgery as well to remove hemorrhoids and surgery GB removal and hernia repair now 11/16/2018   Please call Dr. Carlye Grippe office to see if can be seen for bleeding hemorrhoids for procedure on them as well soon?   Thanks [employer]

## 2018-10-23 ENCOUNTER — Telehealth: Payer: Self-pay

## 2018-10-23 NOTE — Telephone Encounter (Signed)
Copied from [place] [id number]. Topic: General - Other >> Oct 23, 2018  9:38 AM Leward Quan A wrote: Reason for CRM: Patient called to request information on the referral to the General Surgery states that it has been over a week and he is concerned that his issues have not been addressed. Please call patient to discuss other options and address his concerns. Ph# [phone]

## 2018-10-23 NOTE — Telephone Encounter (Signed)
Please sch appt with Dr. Johnathan Hausen asap Call pt asap   thanks  [place]

## 2018-10-24 ENCOUNTER — Other Ambulatory Visit: Payer: Self-pay

## 2018-10-25 ENCOUNTER — Other Ambulatory Visit: Payer: Self-pay

## 2018-10-25 ENCOUNTER — Telehealth: Payer: Self-pay

## 2018-10-25 MED ORDER — PANCRELIPASE (LIP-PROT-AMYL) 40000-126000 UNITS PO CPEP: 40000.0000 [IU] | ORAL_CAPSULE | Freq: Every day | ORAL | 3 refills | Status: DC

## 2018-10-25 NOTE — Telephone Encounter (Signed)
-----   Message from Lucilla Lame, MD sent at 10/22/2018  9:45 AM EDT ----- Diona Fanti 40,000 unit tabs before meals  ----- Message ----- From: Glennie Isle, CMA Sent: 10/22/2018   9:40 AM EDT To: Lucilla Lame, MD  What do you want me to send?  Braiden Rodman ----- Message ----- From: Lucilla Lame, MD Sent: 10/19/2018   4:26 PM EDT To: Glennie Isle, CMA, #  Hi,  No problem. I have included my CMA and we will notify him and get him changed over to see if he feels better.  Thanks Darren ----- Message ----- From: McLean-Scocuzza, Nino Glow, MD Sent: 10/19/2018  12:14 PM EDT To: Lucilla Lame, MD  Thank you would you try to switch him to zenpep ?  If so could you please order than for him and inform   Thanks tMS ----- Message ----- From: Lucilla Lame, MD Sent: 10/19/2018   8:02 AM EDT To: [name], MD  Hi,  Sorry to hear about this patients side effects. Although they may all have the same side effect, he may respond differently. (ie. Most PPI's can cause diarrhea but patient often do not have it when switched). All I can say is it is worth a try.  Thanks Darren ----- Message ----- From: McLean-Scocuzza, Nino Glow, MD Sent: 10/17/2018   5:04 PM EDT To: Lucilla Lame, MD  Hi Dr. Allen Norris,  This mutual pt has chr onic leg edema he reports this was side effect of pancreatic enzyme Pancreaze per pt which is why this was changed to creon and he was put on a diuretic in the past.   I reviewed creon and 3% chance or peripheral edema and he wants to know if anything else he can try for pancreatic insuff.   reviewed zenpep, viokace, pertzye and they have similar side effect profile?  Thanks [employer]

## 2018-10-25 NOTE — Telephone Encounter (Signed)
Tried contacting pt but voicemail was full. Will try later.

## 2018-10-29 NOTE — Telephone Encounter (Signed)
Pt is scheduled with Dr. Hassell Done on 5/29 for his rectal pain.

## 2018-11-09 ENCOUNTER — Encounter (HOSPITAL_BASED_OUTPATIENT_CLINIC_OR_DEPARTMENT_OTHER): Admission: RE | Payer: Self-pay | Source: Home / Self Care

## 2018-11-09 ENCOUNTER — Ambulatory Visit (HOSPITAL_BASED_OUTPATIENT_CLINIC_OR_DEPARTMENT_OTHER)
Admission: RE | Admit: 2018-11-09 | Payer: No Typology Code available for payment source | Source: Home / Self Care | Admitting: Surgery

## 2018-11-09 SURGERY — REPAIR, HERNIA, INGUINAL, ADULT
Anesthesia: General | Laterality: Right

## 2019-01-01 ENCOUNTER — Encounter (HOSPITAL_COMMUNITY): Payer: Self-pay

## 2019-01-01 ENCOUNTER — Other Ambulatory Visit (HOSPITAL_COMMUNITY)
Admission: RE | Admit: 2019-01-01 | Discharge: 2019-01-01 | Disposition: A | Discharge status: Home / Self Care | Payer: No Typology Code available for payment source | Source: Ambulatory Visit | Attending: Surgery | Admitting: Surgery

## 2019-01-01 DIAGNOSIS — Z1159 Encounter for screening for other viral diseases: Secondary | ICD-10-CM | POA: Insufficient documentation

## 2019-01-01 LAB — SARS CORONAVIRUS 2 (TAT 6-24 HRS): SARS Coronavirus 2: NEGATIVE

## 2019-01-01 NOTE — Progress Notes (Addendum)
Spoke with Abigail Butts at Dr. Earlie Server office to request orders in epic.  ECHO 04/28/2018 in care everywhere  The following are in epic: Last office visit Dr. Rockey Situ 10/15/2018 Last office visit Dr. Terese Door 10/17/2018

## 2019-01-01 NOTE — Patient Instructions (Addendum)
DUE TO COVID-19 ONLY ONE VISITOR IS ALLOWED IN THE HOSPITAL AT THIS TIME   COVID SWAB TESTING COMPLETED ON: January 01, 2019 (Must self quarantine after testing. Follow instructions on handout.)   Your procedure is scheduled on: Friday, January 04, 2019   Report to [facility] Main  Entrance   Report to Short Stay at 5:30 AM   Call this number if you have problems the morning of surgery [phone]   Do not eat food or drink liquids :After Midnight.   Brush your teeth the morning of surgery.   Do NOT smoke after Midnight   Take these medicines the morning of surgery with A SIP OF WATER: None                               You may not have any metal on your body including jewelry, and body piercings             Do not wear lotions, powders, perfumes/cologne, or deodorant                           Men may shave face and neck.   Do not bring valuables to the hospital. [place].   Contacts, dentures or bridgework may not be worn into surgery.    Patients discharged the day of surgery will not be allowed to drive home.   Name and phone number of your driver:                Please read over the following fact sheets you were given:  [facility] - Preparing for Surgery Before surgery, you can play an important role.  Because skin is not sterile, your skin needs to be as free of germs as possible.  You can reduce the number of germs on your skin by washing with CHG (chlorahexidine gluconate) soap before surgery.  CHG is an antiseptic cleaner which kills germs and bonds with the skin to continue killing germs even after washing. Please DO NOT use if you have an allergy to CHG or antibacterial soaps.  If your skin becomes reddened/irritated stop using the CHG and inform your nurse when you arrive at Short Stay. Do not shave (including legs and underarms) for at least 48 hours prior to the first CHG shower.  You may shave your  face/neck.  Please follow these instructions carefully:  1.  Shower with CHG Soap the night before surgery and the  morning of surgery.  2.  If you choose to wash your hair, wash your hair first as usual with your normal  shampoo.  3.  After you shampoo, rinse your hair and body thoroughly to remove the shampoo.                             4.  Use CHG as you would any other liquid soap.  You can apply chg directly to the skin and wash.  Gently with a scrungie or clean washcloth.  5.  Apply the CHG Soap to your body ONLY FROM THE NECK DOWN.   Do   not use on face/ open  Wound or open sores. Avoid contact with eyes, ears mouth and   genitals (private parts).                       Wash face,  Genitals (private parts) with your normal soap.             6.  Wash thoroughly, paying special attention to the area where your    surgery  will be performed.  7.  Thoroughly rinse your body with warm water from the neck down.  8.  DO NOT shower/wash with your normal soap after using and rinsing off the CHG Soap.                9.  Pat yourself dry with a clean towel.            10.  Wear clean pajamas.            11.  Place clean sheets on your bed the night of your first shower and do not  sleep with pets. Day of Surgery : Do not apply any lotions/deodorants the morning of surgery.  Please wear clean clothes to the hospital/surgery center.  FAILURE TO FOLLOW THESE INSTRUCTIONS MAY RESULT IN THE CANCELLATION OF YOUR SURGERY  PATIENT SIGNATURE_________________________________  NURSE SIGNATURE__________________________________  ________________________________________________________________________

## 2019-01-02 ENCOUNTER — Other Ambulatory Visit: Payer: Self-pay

## 2019-01-02 ENCOUNTER — Encounter (HOSPITAL_COMMUNITY)
Admission: RE | Admit: 2019-01-02 | Discharge: 2019-01-02 | Disposition: A | Discharge status: Home / Self Care | Payer: No Typology Code available for payment source | Source: Ambulatory Visit | Attending: Surgery | Admitting: Surgery

## 2019-01-02 ENCOUNTER — Encounter: Payer: Self-pay | Admitting: Internal Medicine

## 2019-01-02 ENCOUNTER — Encounter (HOSPITAL_COMMUNITY): Payer: Self-pay

## 2019-01-02 DIAGNOSIS — K8080 Other cholelithiasis without obstruction: Secondary | ICD-10-CM | POA: Insufficient documentation

## 2019-01-02 DIAGNOSIS — K4090 Unilateral inguinal hernia, without obstruction or gangrene, not specified as recurrent: Secondary | ICD-10-CM | POA: Insufficient documentation

## 2019-01-02 DIAGNOSIS — Z01818 Encounter for other preprocedural examination: Secondary | ICD-10-CM | POA: Insufficient documentation

## 2019-01-02 DIAGNOSIS — Z79899 Other long term (current) drug therapy: Secondary | ICD-10-CM | POA: Diagnosis not present

## 2019-01-02 DIAGNOSIS — K8010 Calculus of gallbladder with chronic cholecystitis without obstruction: Secondary | ICD-10-CM | POA: Diagnosis not present

## 2019-01-02 DIAGNOSIS — I10 Essential (primary) hypertension: Secondary | ICD-10-CM | POA: Diagnosis not present

## 2019-01-02 HISTORY — DX: Sleep apnea, unspecified: G47.30

## 2019-01-02 HISTORY — DX: Syncope and collapse: R55

## 2019-01-02 HISTORY — DX: Calculus of gallbladder without cholecystitis without obstruction: K80.20

## 2019-01-02 LAB — CBC
HCT: 33.1 % — ABNORMAL LOW (ref 39.0–52.0)
Hemoglobin: 10.2 g/dL — ABNORMAL LOW (ref 13.0–17.0)
MCH: 29.4 pg (ref 26.0–34.0)
MCHC: 30.8 g/dL (ref 30.0–36.0)
MCV: 95.4 fL (ref 80.0–100.0)
Platelets: 212 10*3/uL (ref 150–400)
RBC: 3.47 MIL/uL — ABNORMAL LOW (ref 4.22–5.81)
RDW: 13.6 % (ref 11.5–15.5)
WBC: 6.2 10*3/uL (ref 4.0–10.5)
nRBC: 0 % (ref 0.0–0.2)

## 2019-01-02 LAB — BASIC METABOLIC PANEL
Anion gap: 11 (ref 5–15)
BUN: 16 mg/dL (ref 6–20)
CO2: 19 mmol/L — ABNORMAL LOW (ref 22–32)
Calcium: 8.9 mg/dL (ref 8.9–10.3)
Chloride: 111 mmol/L (ref 98–111)
Creatinine, Ser: 0.85 mg/dL (ref 0.61–1.24)
GFR calc Af Amer: >60 mL/min (ref >60)
GFR calc non Af Amer: >60 mL/min (ref >60)
Glucose, Bld: 80 mg/dL (ref 70–99)
Potassium: 4 mmol/L (ref 3.5–5.1)
Sodium: 141 mmol/L (ref 135–145)

## 2019-01-03 MED ORDER — BUPIVACAINE LIPOSOME 1.3 % IJ SUSP
20.0000 mL | Freq: Once | INTRAMUSCULAR | Status: DC
  Filled 2019-01-03: qty 20

## 2019-01-03 NOTE — Anesthesia Preprocedure Evaluation (Addendum)
Anesthesia Evaluation  Patient identified by MRN, date of birth, ID band Patient awake    Reviewed: Allergy & Precautions, H&P , NPO status , Patient's Chart, lab work & pertinent test results  Airway Mallampati: II  TM Distance: >3 FB Neck ROM: Full    Dental no notable dental hx. (+) Teeth Intact, Dental Advisory Given   Pulmonary neg pulmonary ROS,    Pulmonary exam normal breath sounds clear to auscultation       Cardiovascular Exercise Tolerance: Good hypertension, Pt. on medications  Rhythm:Regular Rate:Normal     Neuro/Psych negative neurological ROS  negative psych ROS   GI/Hepatic Neg liver ROS, GERD  Controlled,  Endo/Other  negative endocrine ROS  Renal/GU negative Renal ROS  negative genitourinary   Musculoskeletal   Abdominal   Peds  Hematology negative hematology ROS (+)   Anesthesia Other Findings   Reproductive/Obstetrics negative OB ROS                            Anesthesia Physical Anesthesia Plan  ASA: II  Anesthesia Plan: General   Post-op Pain Management:    Induction: Intravenous  PONV Risk Score and Plan: 3 and Ondansetron, Dexamethasone and Midazolam  Airway Management Planned: Oral ETT  Additional Equipment:   Intra-op Plan:   Post-operative Plan: Extubation in OR  Informed Consent: I have reviewed the patients History and Physical, chart, labs and discussed the procedure including the risks, benefits and alternatives for the proposed anesthesia with the patient or authorized representative who has indicated his/her understanding and acceptance.     Dental advisory given  Plan Discussed with: CRNA  Anesthesia Plan Comments:         Anesthesia Quick Evaluation

## 2019-01-03 NOTE — H&P (Signed)
Thomas Shaw Location: [facility] Patient #: [id number] DOB: Sep 06, 1985 Married / Language: English / Race: Black or African American Male   History of Present Illness  The patient is a 33 year old male who presents for an evaluation of a hernia. Thomas Shaw had a SIPS by Dr. Duke Salvia in 2015 and has lost from 375-->165. He is being treated for some issues with Vit D and iron and pancratic insufficiency. He takes a lot of Creon daily. He has been having a lot of upper abdominal problems. Recent CT report reviewed by me shows gallstones. He has a bulge in the right inguinal region consistent with RIH.  Covid delayed his surgery.  He presents for lap chole and RIH.  Informed consent obtained and questions answered.     Past Surgical History Gastric SIPS procedure Resection of Stomach   Diagnostic Studies History  Colonoscopy  never  Allergies  No Known Drug Allergies   Medication History  Triamterene-HCTZ (75-50MG  Tablet, Oral) Active. Vitamin D (Ergocalciferol) (50000UNIT Capsule, Oral) Active. Creon (36000UNIT Capsule DR Part, Oral) Active. Devrom (200MG  Tablet Chewable, Oral) Active. Multivitamin (Oral) Active. Iron (325 (65 Fe)MG Tablet, Oral) Active. Medications Reconciled  Social History Alcohol use  Occasional alcohol use. No drug use  Tobacco use  Current some day smoker.  Family History  Diabetes Mellitus  Father, Mother. Hypertension  Father.  Other Problems  No pertinent past medical history     Review of Systems  General Not Present- Appetite Loss, Chills, Fatigue, Fever, Night Sweats, Weight Gain and Weight Loss. Skin Present- Dryness. Not Present- Change in Wart/Mole, Hives, Jaundice, New Lesions, Non-Healing Wounds, Rash and Ulcer. HEENT Not Present- Earache, Hearing Loss, Hoarseness, Nose Bleed, Oral Ulcers, Ringing in the Ears, Seasonal Allergies, Sinus Pain, Sore Throat, Visual Disturbances, Wears  glasses/contact lenses and Yellow Eyes. Respiratory Not Present- Bloody sputum, Chronic Cough, Difficulty Breathing, Snoring and Wheezing. Cardiovascular Not Present- Chest Pain, Difficulty Breathing Lying Down, Leg Cramps, Palpitations, Rapid Heart Rate, Shortness of Breath and Swelling of Extremities. Gastrointestinal Present- Abdominal Pain and Excessive gas. Not Present- Bloating, Bloody Stool, Change in Bowel Habits, Chronic diarrhea, Constipation, Difficulty Swallowing, Gets full quickly at meals, Hemorrhoids, Indigestion, Nausea, Rectal Pain and Vomiting. Male Genitourinary Not Present- Blood in Urine, Change in Urinary Stream, Frequency, Impotence, Nocturia, Painful Urination, Urgency and Urine Leakage. Hematology Not Present- Blood Thinners, Easy Bruising, Excessive bleeding, Gland problems, HIV and Persistent Infections.  Vitals  Weight: 165.6 lb Height: 69in Body Surface Area: 1.91 m Body Mass Index: 24.45 kg/m  Pulse: 76 (Regular)  BP: 112/70(Sitting, Left Arm, Standard)    Physical Exam WDpreviously obese AAM NAD with redundant skin HEENT unremarkable Neck supple Chest clear Heart SR Abdomen : marked redundant skin of abdominal wall. RIH is painful. RIH and gallstones Plan:  Lap chole and open RIH repair.      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 He has a RIH and wants it repaired at [facility] or elsewhere. For lap chole and RIH  Thomas B. Hassell Done, MD, FACS

## 2019-01-04 ENCOUNTER — Other Ambulatory Visit: Payer: Self-pay

## 2019-01-04 ENCOUNTER — Ambulatory Visit (HOSPITAL_COMMUNITY): Payer: No Typology Code available for payment source | Admitting: Physician Assistant

## 2019-01-04 ENCOUNTER — Ambulatory Visit (HOSPITAL_COMMUNITY): Payer: No Typology Code available for payment source | Admitting: Anesthesiology

## 2019-01-04 ENCOUNTER — Telehealth: Payer: Self-pay

## 2019-01-04 ENCOUNTER — Encounter (HOSPITAL_COMMUNITY)
Admission: RE | Disposition: A | Discharge status: Home / Self Care | Payer: Self-pay | Source: Home / Self Care | Attending: Surgery

## 2019-01-04 ENCOUNTER — Ambulatory Visit (HOSPITAL_COMMUNITY): Payer: No Typology Code available for payment source

## 2019-01-04 ENCOUNTER — Ambulatory Visit (HOSPITAL_COMMUNITY)
Admission: RE | Admit: 2019-01-04 | Discharge: 2019-01-04 | Disposition: A | Discharge status: Home / Self Care | Payer: No Typology Code available for payment source | Attending: Surgery | Admitting: Surgery

## 2019-01-04 ENCOUNTER — Encounter (HOSPITAL_COMMUNITY): Payer: Self-pay | Admitting: *Deleted

## 2019-01-04 DIAGNOSIS — K8010 Calculus of gallbladder with chronic cholecystitis without obstruction: Secondary | ICD-10-CM | POA: Insufficient documentation

## 2019-01-04 DIAGNOSIS — I10 Essential (primary) hypertension: Secondary | ICD-10-CM | POA: Insufficient documentation

## 2019-01-04 DIAGNOSIS — Z79899 Other long term (current) drug therapy: Secondary | ICD-10-CM | POA: Insufficient documentation

## 2019-01-04 DIAGNOSIS — K4090 Unilateral inguinal hernia, without obstruction or gangrene, not specified as recurrent: Secondary | ICD-10-CM | POA: Insufficient documentation

## 2019-01-04 DIAGNOSIS — K8020 Calculus of gallbladder without cholecystitis without obstruction: Secondary | ICD-10-CM

## 2019-01-04 HISTORY — PX: CHOLECYSTECTOMY: SHX55

## 2019-01-04 HISTORY — PX: INGUINAL HERNIA REPAIR: SHX194

## 2019-01-04 SURGERY — REPAIR, HERNIA, INGUINAL, ADULT
Anesthesia: General | Site: Inguinal | Laterality: Right

## 2019-01-04 MED ORDER — CHLORHEXIDINE GLUCONATE CLOTH 2 % EX PADS: 6.0000 | MEDICATED_PAD | Freq: Once | CUTANEOUS | Status: DC

## 2019-01-04 MED ORDER — HYDROCODONE-ACETAMINOPHEN 5-325 MG PO TABS: 1.0000 | ORAL_TABLET | Freq: Four times a day (QID) | ORAL | 0 refills | Status: DC | PRN

## 2019-01-04 MED ORDER — ROCURONIUM BROMIDE 10 MG/ML (PF) SYRINGE
PREFILLED_SYRINGE | INTRAVENOUS | Status: AC
  Filled 2019-01-04: qty 10

## 2019-01-04 MED ORDER — HYDROMORPHONE HCL 1 MG/ML IJ SOLN
0.2500 mg | INTRAMUSCULAR | Status: DC | PRN
  Administered 2019-01-04 (×5): 0.25 mg via INTRAVENOUS

## 2019-01-04 MED ORDER — SODIUM CHLORIDE 0.9% FLUSH
INTRAVENOUS | Status: DC | PRN
  Administered 2019-01-04: 10 mL

## 2019-01-04 MED ORDER — HYDROCODONE-ACETAMINOPHEN 5-325 MG PO TABS
1.0000 | ORAL_TABLET | Freq: Four times a day (QID) | ORAL | Status: DC | PRN
  Administered 2019-01-04: 12:00:00 1 via ORAL

## 2019-01-04 MED ORDER — ACETAMINOPHEN 500 MG PO TABS
ORAL_TABLET | ORAL | Status: AC
  Filled 2019-01-04: qty 1

## 2019-01-04 MED ORDER — SODIUM CHLORIDE (PF) 0.9 % IJ SOLN
INTRAMUSCULAR | Status: AC
  Filled 2019-01-04: qty 10

## 2019-01-04 MED ORDER — FENTANYL CITRATE (PF) 250 MCG/5ML IJ SOLN
INTRAMUSCULAR | Status: AC
  Filled 2019-01-04: qty 5

## 2019-01-04 MED ORDER — LACTATED RINGERS IV SOLN
INTRAVENOUS | Status: DC
  Administered 2019-01-04 (×2): via INTRAVENOUS

## 2019-01-04 MED ORDER — ROCURONIUM BROMIDE 10 MG/ML (PF) SYRINGE
PREFILLED_SYRINGE | INTRAVENOUS | Status: DC | PRN
  Administered 2019-01-04: 20 mg via INTRAVENOUS
  Administered 2019-01-04: 60 mg via INTRAVENOUS
  Administered 2019-01-04: 40 mg via INTRAVENOUS

## 2019-01-04 MED ORDER — EPHEDRINE 5 MG/ML INJ
INTRAVENOUS | Status: AC
  Filled 2019-01-04: qty 10

## 2019-01-04 MED ORDER — SODIUM CHLORIDE 0.9 % IV SOLN
2.0000 g | INTRAVENOUS | Status: AC
  Administered 2019-01-04: 2 g via INTRAVENOUS
  Filled 2019-01-04: qty 2

## 2019-01-04 MED ORDER — HYDROMORPHONE HCL 1 MG/ML IJ SOLN
INTRAMUSCULAR | Status: AC
  Administered 2019-01-04: 0.25 mg via INTRAVENOUS
  Filled 2019-01-04: qty 1

## 2019-01-04 MED ORDER — PHENYLEPHRINE 40 MCG/ML (10ML) SYRINGE FOR IV PUSH (FOR BLOOD PRESSURE SUPPORT)
PREFILLED_SYRINGE | INTRAVENOUS | Status: DC | PRN
  Administered 2019-01-04: 80 ug via INTRAVENOUS

## 2019-01-04 MED ORDER — ACETAMINOPHEN 500 MG PO TABS
1000.0000 mg | ORAL_TABLET | ORAL | Status: AC
  Administered 2019-01-04: 1000 mg via ORAL
  Filled 2019-01-04: qty 2

## 2019-01-04 MED ORDER — DEXAMETHASONE SODIUM PHOSPHATE 10 MG/ML IJ SOLN
INTRAMUSCULAR | Status: DC | PRN
  Administered 2019-01-04: 5 mg via INTRAVENOUS

## 2019-01-04 MED ORDER — SUCCINYLCHOLINE CHLORIDE 200 MG/10ML IV SOSY
PREFILLED_SYRINGE | INTRAVENOUS | Status: AC
  Filled 2019-01-04: qty 10

## 2019-01-04 MED ORDER — 0.9 % SODIUM CHLORIDE (POUR BTL) OPTIME
TOPICAL | Status: DC | PRN
  Administered 2019-01-04: 1000 mL

## 2019-01-04 MED ORDER — SUGAMMADEX SODIUM 200 MG/2ML IV SOLN
INTRAVENOUS | Status: DC | PRN
  Administered 2019-01-04: 200 mg via INTRAVENOUS

## 2019-01-04 MED ORDER — HYDROCODONE-ACETAMINOPHEN 5-325 MG PO TABS
ORAL_TABLET | ORAL | Status: AC
  Administered 2019-01-04: 1 via ORAL
  Filled 2019-01-04: qty 1

## 2019-01-04 MED ORDER — LIDOCAINE 2% (20 MG/ML) 5 ML SYRINGE
INTRAMUSCULAR | Status: DC | PRN
  Administered 2019-01-04: 40 mg via INTRAVENOUS

## 2019-01-04 MED ORDER — MIDAZOLAM HCL 5 MG/5ML IJ SOLN
INTRAMUSCULAR | Status: DC | PRN
  Administered 2019-01-04: 2 mg via INTRAVENOUS

## 2019-01-04 MED ORDER — FENTANYL CITRATE (PF) 250 MCG/5ML IJ SOLN
INTRAMUSCULAR | Status: DC | PRN
  Administered 2019-01-04 (×2): 50 ug via INTRAVENOUS
  Administered 2019-01-04: 100 ug via INTRAVENOUS
  Administered 2019-01-04: 50 ug via INTRAVENOUS

## 2019-01-04 MED ORDER — PHENYLEPHRINE 40 MCG/ML (10ML) SYRINGE FOR IV PUSH (FOR BLOOD PRESSURE SUPPORT)
PREFILLED_SYRINGE | INTRAVENOUS | Status: AC
  Filled 2019-01-04: qty 10

## 2019-01-04 MED ORDER — GABAPENTIN 300 MG PO CAPS
300.0000 mg | ORAL_CAPSULE | ORAL | Status: AC
  Administered 2019-01-04: 06:00:00 300 mg via ORAL
  Filled 2019-01-04: qty 1

## 2019-01-04 MED ORDER — MIDAZOLAM HCL 2 MG/2ML IJ SOLN
INTRAMUSCULAR | Status: AC
  Filled 2019-01-04: qty 2

## 2019-01-04 MED ORDER — LIDOCAINE 2% (20 MG/ML) 5 ML SYRINGE
INTRAMUSCULAR | Status: AC
  Filled 2019-01-04: qty 5

## 2019-01-04 MED ORDER — HEPARIN SODIUM (PORCINE) 5000 UNIT/ML IJ SOLN
5000.0000 [IU] | Freq: Once | INTRAMUSCULAR | Status: AC
  Administered 2019-01-04: 5000 [IU] via SUBCUTANEOUS
  Filled 2019-01-04: qty 1

## 2019-01-04 MED ORDER — PROPOFOL 10 MG/ML IV BOLUS
INTRAVENOUS | Status: AC
  Filled 2019-01-04: qty 20

## 2019-01-04 MED ORDER — PROPOFOL 10 MG/ML IV BOLUS
INTRAVENOUS | Status: DC | PRN
  Administered 2019-01-04: 120 mg via INTRAVENOUS

## 2019-01-04 MED ORDER — LACTATED RINGERS IV SOLN
INTRAVENOUS | Status: AC | PRN
  Administered 2019-01-04: 1000 mL

## 2019-01-04 MED ORDER — DEXAMETHASONE SODIUM PHOSPHATE 10 MG/ML IJ SOLN
INTRAMUSCULAR | Status: AC
  Filled 2019-01-04: qty 1

## 2019-01-04 MED ORDER — IOHEXOL 300 MG/ML  SOLN
INTRAMUSCULAR | Status: DC | PRN
  Administered 2019-01-04: 5 mL

## 2019-01-04 MED ORDER — BUPIVACAINE LIPOSOME 1.3 % IJ SUSP
INTRAMUSCULAR | Status: DC | PRN
  Administered 2019-01-04: 20 mL

## 2019-01-04 SURGICAL SUPPLY — 59 items
APPLICATOR COTTON TIP 6 STRL (MISCELLANEOUS) ×4 IMPLANT
APPLICATOR COTTON TIP 6IN STRL (MISCELLANEOUS) ×6
APPLIER CLIP 9.375 MED OPEN (MISCELLANEOUS) ×3
APPLIER CLIP ROT 10 11.4 M/L (STAPLE) ×3
BENZOIN TINCTURE PRP APPL 2/3 (GAUZE/BANDAGES/DRESSINGS) IMPLANT
BLADE SURG 15 STRL LF DISP TIS (BLADE) ×2 IMPLANT
BLADE SURG 15 STRL SS (BLADE) ×1
CABLE HIGH FREQUENCY MONO STRZ (ELECTRODE) ×3 IMPLANT
CATH REDDICK CHOLANGI 4FR 50CM (CATHETERS) ×3 IMPLANT
CLIP APPLIE 9.375 MED OPEN (MISCELLANEOUS) IMPLANT
CLIP APPLIE ROT 10 11.4 M/L (STAPLE) ×2 IMPLANT
COVER MAYO STAND STRL (DRAPES) ×3 IMPLANT
COVER SURGICAL LIGHT HANDLE (MISCELLANEOUS) ×3 IMPLANT
COVER WAND RF STERILE (DRAPES) IMPLANT
DECANTER SPIKE VIAL GLASS SM (MISCELLANEOUS) ×3 IMPLANT
DERMABOND ADVANCED (GAUZE/BANDAGES/DRESSINGS) ×1
DERMABOND ADVANCED .7 DNX12 (GAUZE/BANDAGES/DRESSINGS) ×2 IMPLANT
DISSECTOR ROUND CHERRY 3/8 STR (MISCELLANEOUS) IMPLANT
DRAIN PENROSE 18X1/2 LTX STRL (DRAIN) ×3 IMPLANT
DRAPE C-ARM 42X120 X-RAY (DRAPES) ×3 IMPLANT
DRAPE LAPAROTOMY TRNSV 102X78 (DRAPES) ×2 IMPLANT
ELECT PENCIL ROCKER SW 15FT (MISCELLANEOUS) ×3 IMPLANT
ELECT REM PT RETURN 15FT ADLT (MISCELLANEOUS) ×3 IMPLANT
GLOVE BIOGEL M 8.0 STRL (GLOVE) ×3 IMPLANT
GOWN STRL REUS W/TWL XL LVL3 (GOWN DISPOSABLE) ×9 IMPLANT
HEMOSTAT SURGICEL 4X8 (HEMOSTASIS) IMPLANT
IV CATH 14GX2 1/4 (CATHETERS) ×3 IMPLANT
KIT BASIN OR (CUSTOM PROCEDURE TRAY) ×3 IMPLANT
KIT TURNOVER KIT A (KITS) IMPLANT
L-HOOK LAP DISP 36CM (ELECTROSURGICAL)
LHOOK LAP DISP 36CM (ELECTROSURGICAL) IMPLANT
MESH HERNIA 3X6 (Mesh General) ×1 IMPLANT
NEEDLE HYPO 22GX1.5 SAFETY (NEEDLE) ×3 IMPLANT
PACK BASIC VI WITH GOWN DISP (CUSTOM PROCEDURE TRAY) ×2 IMPLANT
POUCH RETRIEVAL ECOSAC 10 (ENDOMECHANICALS) IMPLANT
POUCH RETRIEVAL ECOSAC 10MM (ENDOMECHANICALS) ×1
SCISSORS LAP 5X45 EPIX DISP (ENDOMECHANICALS) ×3 IMPLANT
SET IRRIG TUBING LAPAROSCOPIC (IRRIGATION / IRRIGATOR) ×3 IMPLANT
SET TUBE SMOKE EVAC HIGH FLOW (TUBING) ×3 IMPLANT
SLEEVE XCEL OPT CAN 5 100 (ENDOMECHANICALS) ×3 IMPLANT
SPONGE LAP 4X18 RFD (DISPOSABLE) ×3 IMPLANT
STAPLER VISISTAT 35W (STAPLE) ×1 IMPLANT
STRIP CLOSURE SKIN 1/2X4 (GAUZE/BANDAGES/DRESSINGS) IMPLANT
SUT MNCRL AB 4-0 PS2 18 (SUTURE) ×3 IMPLANT
SUT PROLENE 2 0 CT2 30 (SUTURE) ×7 IMPLANT
SUT SILK 2 0 SH (SUTURE) IMPLANT
SUT VIC AB 2-0 SH 27 (SUTURE) ×1
SUT VIC AB 2-0 SH 27X BRD (SUTURE) ×2 IMPLANT
SUT VIC AB 4-0 SH 18 (SUTURE) ×3 IMPLANT
SUT VICRYL 4-0 (SUTURE) ×3 IMPLANT
SYR 20ML LL LF (SYRINGE) ×3 IMPLANT
SYR BULB IRRIGATION 50ML (SYRINGE) ×3 IMPLANT
TOWEL OR 17X26 10 PK STRL BLUE (TOWEL DISPOSABLE) ×3 IMPLANT
TOWEL OR NON WOVEN STRL DISP B (DISPOSABLE) ×3 IMPLANT
TRAY LAPAROSCOPIC (CUSTOM PROCEDURE TRAY) ×3 IMPLANT
TROCAR BLADELESS OPT 5 100 (ENDOMECHANICALS) ×3 IMPLANT
TROCAR XCEL BLUNT TIP 100MML (ENDOMECHANICALS) IMPLANT
TROCAR XCEL NON-BLD 11X100MML (ENDOMECHANICALS) ×3 IMPLANT
YANKAUER SUCT BULB TIP 10FT TU (MISCELLANEOUS) ×3 IMPLANT

## 2019-01-04 NOTE — Interval H&P Note (Signed)
History and Physical Interval Note:  01/04/2019 7:21 AM  Thomas Shaw  has presented today for surgery, with the diagnosis of RIGHT INGUINAL HERNIA.  The various methods of treatment have been discussed with the patient and family. After consideration of risks, benefits and other options for treatment, the patient has consented to  Procedure(s): OPEN RIGHT INGUINAL HERNIA REPAIR (Right) LAPAROSCOPIC CHOLECYSTECTOMY (N/A) as a surgical intervention.  The patient's history has been reviewed, patient examined, no change in status, stable for surgery.  I have reviewed the patient's chart and labs.  Questions were answered to the patient's satisfaction.     Pedro Earls

## 2019-01-04 NOTE — Transfer of Care (Signed)
Immediate Anesthesia Transfer of Care Note  Patient: Lorrie Strauch  Procedure(s) Performed: OPEN RIGHT INGUINAL HERNIA REPAIR (Right Inguinal) LAPAROSCOPIC CHOLECYSTECTOMY with intraoperative cholangiogram (N/A Abdomen)  Patient Location: PACU  Anesthesia Type:General  Level of Consciousness: sedated, patient cooperative and responds to stimulation  Airway & Oxygen Therapy: Patient Spontanous Breathing and Patient connected to nasal cannula oxygen  Post-op Assessment: Report given to RN, Post -op Vital signs reviewed and stable and Patient moving all extremities  Post vital signs: Reviewed and stable  Last Vitals:  Vitals Value Taken Time  BP    Temp    Pulse 79 01/04/19 1013  Resp 15 01/04/19 1014  SpO2 100 % 01/04/19 1013  Vitals shown include unvalidated device data.  Last Pain:  Vitals:   01/04/19 0559  TempSrc: Oral      Patients Stated Pain Goal: 4 (59/45/85 [id number])  Complications: No apparent anesthesia complications

## 2019-01-04 NOTE — Anesthesia Postprocedure Evaluation (Signed)
Anesthesia Post Note  Patient: Thomas Shaw, Thomas Shaw  Procedure(s) Performed: OPEN RIGHT INGUINAL HERNIA REPAIR (Right Inguinal) LAPAROSCOPIC CHOLECYSTECTOMY with intraoperative cholangiogram (N/A Abdomen)     Patient location during evaluation: PACU Anesthesia Type: General Level of consciousness: awake and alert Pain management: pain level controlled Vital Signs Assessment: post-procedure vital signs reviewed and stable Respiratory status: spontaneous breathing, nonlabored ventilation and respiratory function stable Cardiovascular status: blood pressure returned to baseline and stable Postop Assessment: no apparent nausea or vomiting Anesthetic complications: no    Last Vitals:  Vitals:   01/04/19 1045 01/04/19 1100  BP: 124/73 124/73  Pulse: 70 67  Resp: 12 12  Temp:  36.4 C  SpO2: 95% 96%    Last Pain:  Vitals:   01/04/19 1100  TempSrc:   PainSc: 3                  Thomas Shaw,W. EDMOND

## 2019-01-04 NOTE — Telephone Encounter (Signed)
Copied from [place]. Topic: Appointment Scheduling - Scheduling Inquiry for Clinic >> Jan 04, 2019  1:21 PM Reyne Dumas L wrote: Reason for CRM:   Pt states he was told to call and reschedule a September appointment.

## 2019-01-04 NOTE — Discharge Instructions (Signed)
General Anesthesia, Adult, Care After  This sheet gives you information about how to care for yourself after your procedure. Your health care provider may also give you more specific instructions. If you have problems or questions, contact your health care provider. What can I expect after the procedure? After the procedure, the following side effects are common:  Pain or discomfort at the IV site.  Nausea.  Vomiting.  Sore throat.  Trouble concentrating.  Feeling cold or chills.  Weak or tired.  Sleepiness and fatigue.  Soreness and body aches. These side effects can affect parts of the body that were not involved in surgery. Follow these instructions at home:  For at least 24 hours after the procedure:  Have a responsible adult stay with you. It is important to have someone help care for you until you are awake and alert.  Rest as needed.  Do not: ? Participate in activities in which you could fall or become injured. ? Drive. ? Use heavy machinery. ? Drink alcohol. ? Take sleeping pills or medicines that cause drowsiness. ? Make important decisions or sign legal documents. ? Take care of children on your own. Eating and drinking  Follow any instructions from your health care provider about eating or drinking restrictions.  When you feel hungry, start by eating small amounts of foods that are soft and easy to digest (bland), such as toast. Gradually return to your regular diet.  Drink enough fluid to keep your urine pale yellow.  If you vomit, rehydrate by drinking water, juice, or clear broth. General instructions  If you have sleep apnea, surgery and certain medicines can increase your risk for breathing problems. Follow instructions from your health care provider about wearing your sleep device: ? Anytime you are sleeping, including during daytime naps. ? While taking prescription pain medicines, sleeping medicines, or medicines that make you drowsy.  Return  to your normal activities as told by your health care provider. Ask your health care provider what activities are safe for you.  Take over-the-counter and prescription medicines only as told by your health care provider.  If you smoke, do not smoke without supervision.  Keep all follow-up visits as told by your health care provider. This is important. Contact a health care provider if:  You have nausea or vomiting that does not get better with medicine.  You cannot eat or drink without vomiting.  You have pain that does not get better with medicine.  You are unable to pass urine.  You develop a skin rash.  You have a fever.  You have redness around your IV site that gets worse. Get help right away if:  You have difficulty breathing.  You have chest pain.  You have blood in your urine or stool, or you vomit blood. Summary  After the procedure, it is common to have a sore throat or nausea. It is also common to feel tired.  Have a responsible adult stay with you for the first 24 hours after general anesthesia. It is important to have someone help care for you until you are awake and alert.  When you feel hungry, start by eating small amounts of foods that are soft and easy to digest (bland), such as toast. Gradually return to your regular diet.  Drink enough fluid to keep your urine pale yellow.  Return to your normal activities as told by your health care provider. Ask your health care provider what activities are safe for you. This information is  not intended to replace advice given to you by your health care provider. Make sure you discuss any questions you have with your health care provider. Document Released: 09/05/2000 Document Revised: 06/02/2017 Document Reviewed: 01/13/2017 Elsevier Patient Education  [place].    Laparoscopic Cholecystectomy Laparoscopic cholecystectomy is surgery to remove the gallbladder. The gallbladder is a pear-shaped organ that  lies beneath the liver on the right side of the body. The gallbladder stores bile, which is a fluid that helps the body to digest fats. Cholecystectomy is often done for inflammation of the gallbladder (cholecystitis). This condition is usually caused by a buildup of gallstones (cholelithiasis) in the gallbladder. Gallstones can block the flow of bile, which can result in inflammation and pain. In severe cases, emergency surgery may be required. This procedure is done though small incisions in your abdomen (laparoscopic surgery). A thin scope with a camera (laparoscope) is inserted through one incision. Thin surgical instruments are inserted through the other incisions. In some cases, a laparoscopic procedure may be turned into a type of surgery that is done through a larger incision (open surgery). Tell a health care provider about:  Any allergies you have.  All medicines you are taking, including vitamins, herbs, eye drops, creams, and over-the-counter medicines.  Any problems you or family members have had with anesthetic medicines.  Any blood disorders you have.  Any surgeries you have had.  Any medical conditions you have.  Whether you are pregnant or may be pregnant. What are the risks? Generally, this is a safe procedure. However, problems may occur, including:  Infection.  Bleeding.  Allergic reactions to medicines.  Damage to other structures or organs.  A stone remaining in the common bile duct. The common bile duct carries bile from the gallbladder into the small intestine.  A bile leak from the cyst duct that is clipped when your gallbladder is removed. What happens before the procedure? Staying hydrated Follow instructions from your health care provider about hydration, which may include:  Up to 2 hours before the procedure - you may continue to drink clear liquids, such as water, clear fruit juice, black coffee, and plain tea. Eating and drinking restrictions Follow  instructions from your health care provider about eating and drinking, which may include:  8 hours before the procedure - stop eating heavy meals or foods such as meat, fried foods, or fatty foods.  6 hours before the procedure - stop eating light meals or foods, such as toast or cereal.  6 hours before the procedure - stop drinking milk or drinks that contain milk.  2 hours before the procedure - stop drinking clear liquids. Medicines  Ask your health care provider about: ? Changing or stopping your regular medicines. This is especially important if you are taking diabetes medicines or blood thinners. ? Taking medicines such as aspirin and ibuprofen. These medicines can thin your blood. Do not take these medicines before your procedure if your health care provider instructs you not to.  You may be given antibiotic medicine to help prevent infection. General instructions  Let your health care provider know if you develop a cold or an infection before surgery.  Plan to have someone take you home from the hospital or clinic.  Ask your health care provider how your surgical site will be marked or identified. What happens during the procedure?   To reduce your risk of infection: ? Your health care team will wash or sanitize their hands. ? Your skin will  be washed with soap. ? Hair may be removed from the surgical area.  An IV tube may be inserted into one of your veins.  You will be given one or more of the following: ? A medicine to help you relax (sedative). ? A medicine to make you fall asleep (general anesthetic).  A breathing tube will be placed in your mouth.  Your surgeon will make several small cuts (incisions) in your abdomen.  The laparoscope will be inserted through one of the small incisions. The camera on the laparoscope will send images to a TV screen (monitor) in the operating room. This lets your surgeon see inside your abdomen.  Air-like gas will be pumped into  your abdomen. This will expand your abdomen to give the surgeon more room to perform the surgery.  Other tools that are needed for the procedure will be inserted through the other incisions. The gallbladder will be removed through one of the incisions.  Your common bile duct may be examined. If stones are found in the common bile duct, they may be removed.  After your gallbladder has been removed, the incisions will be closed with stitches (sutures), staples, or skin glue.  Your incisions may be covered with a bandage (dressing). The procedure may vary among health care providers and hospitals. What happens after the procedure?  Your blood pressure, heart rate, breathing rate, and blood oxygen level will be monitored until the medicines you were given have worn off.  You will be given medicines as needed to control your pain.  Do not drive for 24 hours if you were given a sedative. This information is not intended to replace advice given to you by your health care provider. Make sure you discuss any questions you have with your health care provider. Document Released: 05/30/2005 Document Revised: 05/12/2017 Document Reviewed: 11/16/2015 Elsevier Patient Education  2020 [place].    Inguinal Hernia, Adult An inguinal hernia is when fat or your intestines push through a weak spot in a muscle where your leg meets your lower belly (groin). This causes a rounded lump (bulge). This kind of hernia could also be:  In your scrotum, if you are male.  In folds of skin around your vagina, if you are male. There are three types of inguinal hernias. These include:  Hernias that can be pushed back into the belly (are reducible). This type rarely causes pain.  Hernias that cannot be pushed back into the belly (are incarcerated).  Hernias that cannot be pushed back into the belly and lose their blood supply (are strangulated). This type needs emergency surgery. If you do not have symptoms,  you may not need treatment. If you have symptoms or a large hernia, you may need surgery. Follow these instructions at home: Lifestyle  Do these things if told by your doctor so you do not have trouble pooping (constipation): ? Drink enough fluid to keep your pee (urine) pale yellow. ? Eat foods that have a lot of fiber. These include fresh fruits and vegetables, whole grains, and beans. ? Limit foods that are high in fat and processed sugars. These include foods that are fried or sweet. ? Take medicine for trouble pooping.  Avoid lifting heavy objects.  Avoid standing for long amounts of time.  Do not use any products that contain nicotine or tobacco. These include cigarettes and e-cigarettes. If you need help quitting, ask your doctor.  Stay at a healthy weight. General instructions  You may try to push  your hernia in by very gently pressing on it when you are lying down. Do not try to force the bulge back in if it will not push in easily.  Watch your hernia for any changes in shape, size, or color. Tell your doctor if you see any changes.  Take over-the-counter and prescription medicines only as told by your doctor.  Keep all follow-up visits as told by your doctor. This is important. Contact a doctor if:  You have a fever.  You have new symptoms.  Your symptoms get worse. Get help right away if:  The area where your leg meets your lower belly has: ? Pain that gets worse suddenly. ? A bulge that gets bigger suddenly, and it does not get smaller after that. ? A bulge that turns red or purple. ? A bulge that is painful when you touch it.  You are a man, and your scrotum: ? Suddenly feels painful. ? Suddenly changes in size.  You cannot push the hernia in by very gently pressing on it when you are lying down. Do not try to force the bulge back in if it will not push in easily.  You feel sick to your stomach (nauseous), and that feeling does not go away.  You throw up  (vomit), and that keeps happening.  You have a fast heartbeat.  You cannot poop (have a bowel movement) or pass gas. These symptoms may be an emergency. Do not wait to see if the symptoms will go away. Get medical help right away. Call your local emergency services (911 in the [place] Summary  An inguinal hernia is when fat or your intestines push through a weak spot in a muscle where your leg meets your lower belly (groin). This causes a rounded lump (bulge).  If you do not have symptoms, you may not need treatment. If you have symptoms or a large hernia, you may need surgery.  Avoid lifting heavy objects. Also avoid standing for long amounts of time.  Do not try to force the bulge back in if it will not push in easily. This information is not intended to replace advice given to you by your health care provider. Make sure you discuss any questions you have with your health care provider. Document Released: 06/30/2006 Document Revised: 07/01/2017 Document Reviewed: 03/01/2017 Elsevier Patient Education  2020 [employer].

## 2019-01-04 NOTE — Anesthesia Procedure Notes (Signed)
Procedure Name: Intubation Date/Time: 01/04/2019 7:27 AM Performed by: Myna Bright, CRNA Pre-anesthesia Checklist: Patient identified, Emergency Drugs available, Suction available and Patient being monitored Patient Re-evaluated:Patient Re-evaluated prior to induction Oxygen Delivery Method: Circle system utilized Preoxygenation: Pre-oxygenation with 100% oxygen Induction Type: IV induction Ventilation: Mask ventilation without difficulty Laryngoscope Size: Mac and 4 Grade View: Grade I Tube type: Oral Tube size: 7.5 mm Number of attempts: 1 Airway Equipment and Method: Stylet Placement Confirmation: ETT inserted through vocal cords under direct vision,  positive ETCO2 and breath sounds checked- equal and bilateral Secured at: 22 cm Tube secured with: Tape Dental Injury: Teeth and Oropharynx as per pre-operative assessment

## 2019-01-04 NOTE — Op Note (Addendum)
Thomas Shaw  Primary Care Physician:  McLean-Scocuzza, Nino Glow, MD    01/04/2019  10:07 AM  Procedure: Laparoscopic Cholecystectomy with intraoperative cholangiogram; open right inguinal hernia repair with mesh and biopsy of inguinal lymph node  Surgeon: Catalina Antigua B. Hassell Done, MD, FACS Asst:  none  Anes:  General  Drains:  None  Findings: Many small anthracotic pigment stones some impacted in the cystic duct with chronic cholecystitis;  Bilateral direct inguinal herniae diagnosed with laparoscopy; slightly firm node in the right superficial inguinal region  Description of Procedure: The patient was taken to OR 1 and given general anesthesia.  The patient was prepped with PCMX and draped sterilely. A time out was performed.  Access to the abdomen was achieved with a 5 mm Optiview through the left upper quadrant.  Port placement included an 11 in the upper midline and a 5 mm laterally and a 5 mm near the umbilicus for the scope.    The gallbladder was visualized and the fundus was grasped and the gallbladder was elevated. Traction on the infundibulum allowed for successful demonstration of the critical view. Inflammatory changes were chronic.  The cystic duct was identified and clipped up on the gallbladder and an incision was made in the cystic duct and the Reddick catheter was inserted after milking the cystic duct of any debris.  Milking brought forth impacted black stones.  A dynamic cholangiogram was performed which demonstrated small intrahepatic ducts and free flow into the duodenum.    The cystic duct was then triple clipped and divided, the cystic artery was double clipped and divided and then the gallbladder was removed from the gallbladder bed. Removal of the gallbladder from the gallbladder bed was  Performed without spillage.  The gallbladder was then placed in a bag and brought out through one of the trocar sites. The gallbladder bed was inspected and no bleeding or bile leaks were  seen.   Laparoscopic visualization was used to inspect the inguinal regions.  Two direct herniae were noted. Mr. Liberati had presented with a painful RIH and that was what we had designated to repair.   I performed a tap block with 30 cc of Exparel along both sides laterally under laparoscopic vision.  An oblique incision was made in the right inguinal region.  The external oblique fascia were noted and the hernia bulge which we were visualizing as we maintain pneumoperitoneum during the initial dissection of this inguinal hernia.  There was a slightly firm 1 cm lymph node lying in the inguinal crease and this was clipped and removed and sent for permanent sections.  The external oblique was incised along the fibers and the cord was mobilized.  This was taken to the side and the direct hernia was visualized with the laparoscope and dissected free.  At this point remove the scope and decompressed the abdomen and then imbricated the direct hernia sac with a running 2-0 Prolene.  This obliterated the hernia and following this I then performed a repair using a piece of Marlex type mesh cutting it to fit and suturing along the inguinal ligament and then medially to the end internal oblique and then cutting it to go around the cord structures and sutured it to itself with a horizontal mattress suture of 2-0 Prolene.  The mesh was then tucked beneath the external oblique fascia which was then closed with a running 2-0 Vicryl.  The inguinal region was closed in layers with 4-0 Vicryl and with running subcuticular 4-0 Monocryl and  Dermabond.  The trocar sites were closed with 4-0 Vicryl and Dermabond.  Sponge and needle count were correct.    The patient was taken to the recovery room in satisfactory condition.

## 2019-01-07 ENCOUNTER — Encounter (HOSPITAL_COMMUNITY): Payer: Self-pay | Admitting: Surgery

## 2019-01-22 ENCOUNTER — Ambulatory Visit: Payer: No Typology Code available for payment source | Admitting: Internal Medicine

## 2019-02-19 ENCOUNTER — Ambulatory Visit: Payer: No Typology Code available for payment source | Admitting: Internal Medicine

## 2019-02-26 ENCOUNTER — Ambulatory Visit: Payer: No Typology Code available for payment source | Admitting: Internal Medicine

## 2019-02-26 DIAGNOSIS — Z0289 Encounter for other administrative examinations: Secondary | ICD-10-CM

## 2019-02-26 NOTE — Progress Notes (Deleted)
Called patient at 4:00 pm and 4:14 pm to check-in for virtual visit that was scheduled for today (02/26/19 @ 4:00 pm).  No answer.  LMTCB both times.

## 2019-03-11 ENCOUNTER — Other Ambulatory Visit: Payer: Self-pay | Admitting: Gastroenterology

## 2019-03-20 ENCOUNTER — Other Ambulatory Visit: Payer: Self-pay

## 2019-03-20 ENCOUNTER — Ambulatory Visit (INDEPENDENT_AMBULATORY_CARE_PROVIDER_SITE_OTHER): Payer: No Typology Code available for payment source | Admitting: Internal Medicine

## 2019-03-20 ENCOUNTER — Encounter: Payer: Self-pay | Admitting: Internal Medicine

## 2019-03-20 VITALS — Ht 71.0 in | Wt 187.5 lb

## 2019-03-20 DIAGNOSIS — I890 Lymphedema, not elsewhere classified: Secondary | ICD-10-CM | POA: Diagnosis not present

## 2019-03-20 DIAGNOSIS — K649 Unspecified hemorrhoids: Secondary | ICD-10-CM

## 2019-03-20 DIAGNOSIS — Z0000 Encounter for general adult medical examination without abnormal findings: Secondary | ICD-10-CM | POA: Diagnosis not present

## 2019-03-20 DIAGNOSIS — Z1329 Encounter for screening for other suspected endocrine disorder: Secondary | ICD-10-CM

## 2019-03-20 DIAGNOSIS — Z1389 Encounter for screening for other disorder: Secondary | ICD-10-CM

## 2019-03-20 DIAGNOSIS — D649 Anemia, unspecified: Secondary | ICD-10-CM

## 2019-03-20 DIAGNOSIS — Z13220 Encounter for screening for lipoid disorders: Secondary | ICD-10-CM

## 2019-03-20 DIAGNOSIS — E559 Vitamin D deficiency, unspecified: Secondary | ICD-10-CM

## 2019-03-20 DIAGNOSIS — Z9884 Bariatric surgery status: Secondary | ICD-10-CM

## 2019-03-20 NOTE — Progress Notes (Signed)
Virtual Visit via Video Note  I connected with Thomas Shaw   on 03/20/19 at  1:15 PM EDT by a video enabled telemedicine application and verified that I am speaking with the correct person using two identifiers.  Location patient: work  Environmental manager or home office Persons participating in the virtual visit: patient, provider  I discussed the limitations of evaluation and management by telemedicine and the availability of in person appointments. The patient expressed understanding and agreed to proceed.   HPI: Annual doing well s/p right inguinal hernia repair and GB removal with Dr. Johnathan Hausen s/p GB removal stools are loose and solid but feeling better no h/v   BRBPR resolved and no further issues with hemorrhoids for now will let me know if returns. No blood in stool in 2 months   Leg edema chronic still happening tried 1/2 maxzide still had leg edema taking 1 whole pill now and wearing compression stockings  No further syncope    ROS: See pertinent positives and negatives per HPI. General: fatigue at end of day  HEENT; no sore throat  CV: no chest pain  Lungs: no sob  GI: no ab pain/n/v or blood in stool  GU: no issues  MSK; no issues  Skin: no issues  Neuro: no syncope  Psych: no anxiety/depession    Past Medical History:  Diagnosis Date  . Anemia, unspecified 02/22/2016   iron deficient  . Blood in stool   . Diarrhea   . Essential hypertension 12/30/2016  . Exocrine pancreatic insufficiency 10/15/2014  . Gallstones   . Gastro-esophageal reflux 12/30/2016  . Inguinal hernia   . Sleep apnea    hx of befor weight loss surgery none now  . Vasovagal syncope   . Vitamin D deficiency 12/30/2016    Past Surgical History:  Procedure Laterality Date  . 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 CHOLECYSTECTOMY N/A 01/04/2019   Procedure: LAPAROSCOPIC CHOLECYSTECTOMY with intraoperative cholangiogram;  Surgeon: Johnathan Hausen, MD;  Location: [facility];  Service: General;  Laterality: N/A;  . INGUINAL HERNIA REPAIR Right 01/04/2019   Procedure: OPEN RIGHT INGUINAL HERNIA REPAIR;  Surgeon: Johnathan Hausen, MD;  Location: [facility];  Service: General;  Laterality: Right;    Family History  Problem Relation Age of Onset  . Pancreatic disease Father   . Diabetes Father   . Hypertension Father   . Diabetes Mother   . Hypertension Mother   . Hypertension Brother        ?  . Prostate cancer Neg Hx   . Kidney cancer Neg Hx   . Bladder Cancer Neg Hx     SOCIAL HX:  Married    Current Outpatient Medications:  .  EPINEPHrine 0.3 mg/0.3 mL IJ SOAJ injection, Inject 0.3 mLs (0.3 mg total) into the muscle once. Follow package instructions as needed for severe allergy or anaphylactic reaction. (Patient taking differently: Inject 0.3 mg into the muscle as needed for anaphylaxis. ), Disp: 1 Device, Rfl: 2 .  fluticasone (FLONASE) 50 MCG/ACT nasal spray, Place 2 sprays into both nostrils daily., Disp: 16 g, Rfl: 12 .  HYDROcodone-acetaminophen (NORCO/VICODIN) 5-325 MG tablet, Take 1 tablet by mouth every 6 (six) hours as needed for moderate pain., Disp: 15 tablet, Rfl: 0 .  Multiple Vitamin (MULTI-VITAMINS) TABS, Take 1 tablet by mouth daily. , Disp: , Rfl:  .  oxymetazoline (AFRIN NASAL SPRAY) 0.05 % nasal spray, Place 1 spray into both nostrils 2 (  two) times daily., Disp: 30 mL, Rfl: 0 .  polycarbophil (FIBERCON) 625 MG tablet, Take 625 mg by mouth 3 (three) times daily., Disp: , Rfl:  .  triamterene-hydrochlorothiazide (MAXZIDE-25) 37.5-25 MG tablet, Take 1 tablet by mouth daily as needed. (Patient taking differently: Take 1 tablet by mouth daily. qam), Disp: 90 tablet, Rfl: 3 .  ZENPEP 40000-126000 units CPEP, TAKE 2 CAPSULES BY MOUTH WITH EACH MEAL (BREAKFAST, LUNCH, AND DINNER), Disp: 180 capsule, Rfl: 3  EXAM:  VITALS per patient if applicable:  GENERAL: alert, oriented, appears well and in no acute  distress  HEENT: atraumatic, conjunttiva clear, no obvious abnormalities on inspection of external nose and ears  NECK: normal movements of the head and neck  LUNGS: on inspection no signs of respiratory distress, breathing rate appears normal, no obvious gross SOB, gasping or wheezing  CV: no obvious cyanosis  MS: moves all visible extremities without noticeable abnormality  PSYCH/NEURO: pleasant and cooperative, no obvious depression or anxiety, speech and thought processing grossly intact  ASSESSMENT AND PLAN:  Discussed the following assessment and plan:  Annual physical exam Flu shot utd had work 2020  Consider Tdap in [place] labs 07/04/19  Consider hep B in future not immune declines for now  MMR immune  Hep C neg 09/19/17  HIV neg 02/14/17 EGD 05/16/13 normal   Hemorrhoids, unspecified hemorrhoid type -if bleeding again consider referral Dr. Hassell Done or [place] established Dr. 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 Pt prefers to treat via telemedicine empirically rather then risking or undertaking an in person visit at this moment. Patient agrees to seek prompt in person care if worsening, new symptoms arise, or if is not improving with treatment.   I discussed the assessment and treatment plan with the patient. The patient was provided an opportunity to ask questions and all were answered. The patient agreed with the plan and demonstrated an understanding of the  instructions.   The patient was advised to call back or seek an in-person evaluation if the symptoms worsen or if the condition fails to improve as anticipated.   Thomas Glow McLean-Scocuzza, MD

## 2019-03-20 NOTE — Patient Instructions (Signed)
Vitamin D3 5000 IU daily    Lymphedema  Lymphedema is swelling that is caused by the abnormal collection of lymph in the tissues under the skin. Lymph is fluid from the tissues in your body that is removed through the lymphatic system. This system is part of your body's defense system (immune system) and includes lymph nodes and lymph vessels. The lymph vessels collect and carry the excess fluid, fats, proteins, and wastes from the tissues of the body to the bloodstream. This system also works to clean and remove bacteria and waste products from the body. Lymphedema occurs when the lymphatic system is blocked. When the lymph vessels or lymph nodes are blocked or damaged, lymph does not drain properly. This causes an abnormal buildup of lymph, which leads to swelling in the affected area. This may include the trunk area, or an arm or leg. Lymphedema cannot be cured by medicines, but various methods can be used to help reduce the swelling. There are two types of lymphedema: primary lymphedema and secondary lymphedema. What are the causes? The cause of this condition depends on the type of lymphedema that you have.  Primary lymphedema is caused by the absence of lymph vessels or having abnormal lymph vessels at birth.  Secondary lymphedema occurs when lymph vessels are blocked or damaged. Secondary lymphedema is more common. Common causes of lymph vessel blockage include: ? Skin infection, such as cellulitis. ? Infection by parasites (filariasis). ? Injury. ? Radiation therapy. ? Cancer. ? Formation of scar tissue. ? Surgery. What are the signs or symptoms? Symptoms of this condition include:  Swelling of the arm or leg.  A heavy or tight feeling in the arm or leg.  Swelling of the feet, toes, or fingers. Shoes or rings may fit more tightly than before.  Redness of the skin over the affected area.  Limited movement of the affected limb.  Sensitivity to touch or discomfort in the  affected limb. How is this diagnosed? This condition may be diagnosed based on:  Your symptoms and medical history.  A physical exam.  Bioimpedance spectroscopy. In this test, painless electrical currents are used to measure fluid levels in your body.  Imaging tests, such as: ? Lymphoscintigraphy. In this test, a low dose of a radioactive substance is injected to trace the flow of lymph through the lymph vessels. ? MRI. ? CT scan. ? Duplex ultrasound. This test uses sound waves to produce images of the vessels and the blood flow on a screen. ? Lymphangiography. In this test, a contrast dye is injected into the lymph vessel to help show blockages. How is this treated? Treatment for this condition may depend on the cause of your lymphedema. Treatment may include:  Complete decongestive therapy (CDT). This is done by a certified lymphedema therapist to reduce fluid congestion. This therapy includes: ? Manual lymph drainage. This is a special massage technique that promotes lymph drainage out of a limb. ? Skin care. ? Compression wrapping of the affected area. ? Specific exercises. Certain exercises can help fluid move out of the affected limb.  Compression. Various methods may be used to apply pressure to the affected limb to reduce the swelling. They include: ? Wearing compression stockings or sleeves on the affected limb. ? Wrapping the affected limb with special bandages.  Surgery. This is usually done for severe cases only. For example, surgery may be done if you have trouble moving the limb or if the swelling does not get better with other treatments. If  an underlying condition is causing the lymphedema, treatment for that condition will be done. For example, antibiotic medicines may be used to treat an infection. Follow these instructions at home: Self-care  The affected area is more likely to become injured or infected. Take these steps to help prevent infection: ? Keep the  affected area clean and dry. ? Use approved creams or lotions to keep the skin moisturized. ? Protect your skin from cuts:  Use gloves while cooking or gardening.  Do not walk barefoot.  If you shave the affected area, use an Copy.  Do not wear tight clothes, shoes, or jewelry.  Eat a healthy diet that includes a lot of fruits and vegetables. Activity  Exercise regularly as directed by your health care provider.  Do not sit with your legs crossed.  When possible, keep the affected limb raised (elevated) above the level of your heart.  Avoid carrying things with an arm that is affected by lymphedema. General instructions  Wear compression stockings or sleeves as told by your health care provider.  Note any changes in size of the affected limb. You may be instructed to take regular measurements and keep track of them.  Take over-the-counter and prescription medicines only as told by your health care provider.  If you were prescribed an antibiotic medicine, take or apply it as told by your health care provider. Do not stop using the antibiotic even if you start to feel better.  Do not use heating pads or ice packs over the affected area.  Avoid having blood draws, IV insertions, or blood pressure checked on the affected limb.  Keep all follow-up visits as told by your health care provider. This is important. Contact a health care provider if you:  Continue to have swelling in your limb.  Have a cut that does not heal.  Have redness or pain in the affected area. Get help right away if you:  Have new swelling in your limb that comes on suddenly.  Develop purplish spots, rash or sores (lesions) on your affected limb.  Have shortness of breath.  Have a fever or chills. Summary  Lymphedema is swelling that is caused by the abnormal collection of lymph in the tissues under the skin.  Lymph is fluid from the tissues in your body that is removed through the  lymphatic system. This system collects and carries excess fluid, fats, proteins, and wastes from the tissues of the body to the bloodstream.  Lymphedema causes swelling, pain, and redness in the affected area. This may include the trunk area, or an arm or leg.  Treatment for this condition may depend on the cause of your lymphedema. Treatment may include complete decongestive therapy (CDT), compression methods, surgery, or treating the underlying cause. This information is not intended to replace advice given to you by your health care provider. Make sure you discuss any questions you have with your health care provider. Document Released: 03/27/2007 Document Revised: 06/12/2017 Document Reviewed: 06/12/2017 Elsevier Patient Education  2020 [employer].

## 2019-03-21 ENCOUNTER — Telehealth: Payer: Self-pay | Admitting: Internal Medicine

## 2019-03-21 NOTE — Telephone Encounter (Signed)
I called pt and left a vm to call ofc to schedule Return for fasting labs 07/04/19 with Tdap, f/u in 4-6 months sooner prn .

## 2019-06-12 IMAGING — NM NUCLEAR MEDICINE HEPATOBILIARY IMAGING WITH GALLBLADDER EF
3 series · 13 of 13 positions shown · non-contrast
Comparison: None.

CLINICAL DATA: Pain

EXAM:
NUCLEAR MEDICINE HEPATOBILIARY IMAGING WITH GALLBLADDER EF
VIEWS:
Anterior right upper quadrant
RADIOPHARMACEUTICALS:  5.615 mCi Mc-33m mebrofenin IV

[Series 1000: hepatobiliary scan · 9.59mm/px · 6 of 60 frames shown]
[frame 6/60]
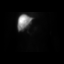
[frame 16/60]
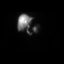
[frame 26/60]
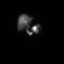
[frame 36/60]
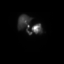
[frame 46/60]
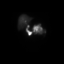
[frame 56/60]
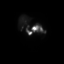

[Series 1000: gallbladder ef · 4.80mm/px · 6 of 120 frames shown]
[frame 11/120]
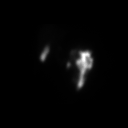
[frame 31/120]
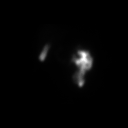
[frame 51/120]
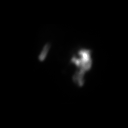
[frame 71/120]
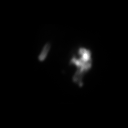
[frame 91/120]
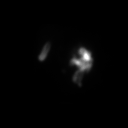
[frame 111/120]
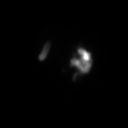

[Series 1000: gallbladder delays · 3.30mm/px · 1 of 1 slices shown]
[im 1/1]
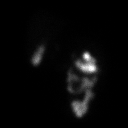

[13 of 13 positions shown; findings below may reference images not displayed]

FINDINGS: Liver uptake of radiotracer is unremarkable. There is prompt
visualization of small bowel indicating patency of the common bile
duct. Gallbladder visualizes indicating patency of the cystic duct.
Gallbladder visualization is delayed.

The patient consumed 8 ounces of Ensure orally with calculation of
the computer generated ejection fraction of radiotracer from the
gallbladder. No report of clinical symptoms with the oral Ensure
consumption. The computer generated ejection fraction of radiotracer
from the gallbladder is abnormally low at 3%, normal greater than
33% using the oral agent.
IMPRESSION: Abnormally low ejection fraction of radiotracer from the
gallbladder, a finding indicative of a degree of biliary dyskinesia.
Relative delay in gallbladder visualization may be seen secondary to
a degree of chronic cholecystitis. Cystic and common bile ducts are
patent as is evidenced by visualization of gallbladder and small
bowel.

## 2019-06-17 ENCOUNTER — Other Ambulatory Visit (INDEPENDENT_AMBULATORY_CARE_PROVIDER_SITE_OTHER): Payer: No Typology Code available for payment source

## 2019-06-17 ENCOUNTER — Other Ambulatory Visit: Payer: Self-pay | Admitting: Internal Medicine

## 2019-06-17 ENCOUNTER — Other Ambulatory Visit: Payer: Self-pay

## 2019-06-17 DIAGNOSIS — Z1389 Encounter for screening for other disorder: Secondary | ICD-10-CM

## 2019-06-17 DIAGNOSIS — Z13220 Encounter for screening for lipoid disorders: Secondary | ICD-10-CM | POA: Diagnosis not present

## 2019-06-17 DIAGNOSIS — D649 Anemia, unspecified: Secondary | ICD-10-CM

## 2019-06-17 DIAGNOSIS — Z0000 Encounter for general adult medical examination without abnormal findings: Secondary | ICD-10-CM | POA: Diagnosis not present

## 2019-06-17 DIAGNOSIS — E559 Vitamin D deficiency, unspecified: Secondary | ICD-10-CM

## 2019-06-17 DIAGNOSIS — Z1329 Encounter for screening for other suspected endocrine disorder: Secondary | ICD-10-CM | POA: Diagnosis not present

## 2019-06-17 LAB — CBC WITH DIFFERENTIAL/PLATELET
Basophils Absolute: 0 10*3/uL (ref 0.0–0.1)
Basophils Relative: 1.3 % (ref 0.0–3.0)
Eosinophils Absolute: 0.1 10*3/uL (ref 0.0–0.7)
Eosinophils Relative: 2 % (ref 0.0–5.0)
HCT: 32.7 % — ABNORMAL LOW (ref 39.0–52.0)
Hemoglobin: 10.8 g/dL — ABNORMAL LOW (ref 13.0–17.0)
Lymphocytes Relative: 52.6 % — ABNORMAL HIGH (ref 12.0–46.0)
Lymphs Abs: 1.9 10*3/uL (ref 0.7–4.0)
MCHC: 33 g/dL (ref 30.0–36.0)
MCV: 88.9 fl (ref 78.0–100.0)
Monocytes Absolute: 0.3 10*3/uL (ref 0.1–1.0)
Monocytes Relative: 8.9 % (ref 3.0–12.0)
Neutro Abs: 1.3 10*3/uL — ABNORMAL LOW (ref 1.4–7.7)
Neutrophils Relative %: 35.2 % — ABNORMAL LOW (ref 43.0–77.0)
Platelets: 195 10*3/uL (ref 150.0–400.0)
RBC: 3.68 Mil/uL — ABNORMAL LOW (ref 4.22–5.81)
RDW: 14.1 % (ref 11.5–15.5)
WBC: 3.6 10*3/uL — ABNORMAL LOW (ref 4.0–10.5)

## 2019-06-17 LAB — COMPREHENSIVE METABOLIC PANEL
ALT: 23 U/L (ref 0–53)
AST: 24 U/L (ref 0–37)
Albumin: 4 g/dL (ref 3.5–5.2)
Alkaline Phosphatase: 130 U/L — ABNORMAL HIGH (ref 39–117)
BUN: 12 mg/dL (ref 6–23)
CO2: 27 mEq/L (ref 19–32)
Calcium: 8.8 mg/dL (ref 8.4–10.5)
Chloride: 107 mEq/L (ref 96–112)
Creatinine, Ser: 0.94 mg/dL (ref 0.40–1.50)
GFR: 112.51 mL/min (ref >60.00)
Glucose, Bld: 98 mg/dL (ref 70–99)
Potassium: 3.9 mEq/L (ref 3.5–5.1)
Sodium: 141 mEq/L (ref 135–145)
Total Bilirubin: 0.8 mg/dL (ref 0.2–1.2)
Total Protein: 6.6 g/dL (ref 6.0–8.3)

## 2019-06-17 LAB — LIPID PANEL
Cholesterol: 85 mg/dL (ref 0–200)
HDL: 36.1 mg/dL — ABNORMAL LOW (ref >39.00)
LDL Cholesterol: 37 mg/dL (ref 0–99)
NonHDL: 48.93
Total CHOL/HDL Ratio: 2
Triglycerides: 62 mg/dL (ref 0.0–149.0)
VLDL: 12.4 mg/dL (ref 0.0–40.0)

## 2019-06-17 LAB — VITAMIN D 25 HYDROXY (VIT D DEFICIENCY, FRACTURES): VITD: 15.81 ng/mL — ABNORMAL LOW (ref 30.00–100.00)

## 2019-06-17 LAB — TSH: TSH: 1.91 u[IU]/mL (ref 0.35–4.50)

## 2019-06-17 MED ORDER — CHOLECALCIFEROL 1.25 MG (50000 UT) PO CAPS: 50000.0000 [IU] | ORAL_CAPSULE | ORAL | 1 refills | Status: DC

## 2019-06-18 LAB — URINALYSIS, ROUTINE W REFLEX MICROSCOPIC
Bilirubin Urine: NEGATIVE
Glucose, UA: NEGATIVE
Hgb urine dipstick: NEGATIVE
Ketones, ur: NEGATIVE
Leukocytes,Ua: NEGATIVE
Nitrite: NEGATIVE
Protein, ur: NEGATIVE
Specific Gravity, Urine: 1.007 (ref 1.001–1.03)
pH: 6 (ref 5.0–8.0)

## 2019-06-18 LAB — IRON,TIBC AND FERRITIN PANEL
%SAT: 19 % (calc) — ABNORMAL LOW (ref 20–48)
Ferritin: 56 ng/mL (ref 38–380)
Iron: 65 ug/dL (ref 50–180)
TIBC: 337 mcg/dL (calc) (ref 250–425)

## 2019-09-09 ENCOUNTER — Other Ambulatory Visit: Payer: Self-pay | Admitting: Gastroenterology

## 2019-12-29 ENCOUNTER — Other Ambulatory Visit: Payer: Self-pay | Admitting: Internal Medicine

## 2019-12-29 DIAGNOSIS — J309 Allergic rhinitis, unspecified: Secondary | ICD-10-CM

## 2019-12-29 DIAGNOSIS — R600 Localized edema: Secondary | ICD-10-CM

## 2019-12-29 MED ORDER — TRIAMTERENE-HCTZ 37.5-25 MG PO TABS: 1.0000 | ORAL_TABLET | Freq: Every day | ORAL | 3 refills | Status: DC | PRN

## 2019-12-29 MED ORDER — FLUTICASONE PROPIONATE 50 MCG/ACT NA SUSP: 2.0000 | Freq: Every day | NASAL | 12 refills | Status: DC

## 2020-01-08 ENCOUNTER — Encounter: Payer: Self-pay | Admitting: Internal Medicine

## 2020-01-08 ENCOUNTER — Ambulatory Visit (INDEPENDENT_AMBULATORY_CARE_PROVIDER_SITE_OTHER): Payer: No Typology Code available for payment source | Admitting: Internal Medicine

## 2020-01-08 ENCOUNTER — Other Ambulatory Visit: Payer: Self-pay

## 2020-01-08 VITALS — BP 122/70 | HR 80 | Temp 98.1°F | Ht 69.49 in | Wt 183.4 lb

## 2020-01-08 DIAGNOSIS — E538 Deficiency of other specified B group vitamins: Secondary | ICD-10-CM

## 2020-01-08 DIAGNOSIS — R748 Abnormal levels of other serum enzymes: Secondary | ICD-10-CM | POA: Diagnosis not present

## 2020-01-08 DIAGNOSIS — E559 Vitamin D deficiency, unspecified: Secondary | ICD-10-CM

## 2020-01-08 DIAGNOSIS — E663 Overweight: Secondary | ICD-10-CM | POA: Insufficient documentation

## 2020-01-08 DIAGNOSIS — D509 Iron deficiency anemia, unspecified: Secondary | ICD-10-CM | POA: Diagnosis not present

## 2020-01-08 DIAGNOSIS — E611 Iron deficiency: Secondary | ICD-10-CM

## 2020-01-08 DIAGNOSIS — D649 Anemia, unspecified: Secondary | ICD-10-CM

## 2020-01-08 DIAGNOSIS — F329 Major depressive disorder, single episode, unspecified: Secondary | ICD-10-CM

## 2020-01-08 DIAGNOSIS — D72819 Decreased white blood cell count, unspecified: Secondary | ICD-10-CM

## 2020-01-08 DIAGNOSIS — K8681 Exocrine pancreatic insufficiency: Secondary | ICD-10-CM

## 2020-01-08 DIAGNOSIS — F419 Anxiety disorder, unspecified: Secondary | ICD-10-CM

## 2020-01-08 DIAGNOSIS — K909 Intestinal malabsorption, unspecified: Secondary | ICD-10-CM | POA: Insufficient documentation

## 2020-01-08 DIAGNOSIS — M65311 Trigger thumb, right thumb: Secondary | ICD-10-CM

## 2020-01-08 DIAGNOSIS — R143 Flatulence: Secondary | ICD-10-CM | POA: Insufficient documentation

## 2020-01-08 DIAGNOSIS — F32A Depression, unspecified: Secondary | ICD-10-CM

## 2020-01-08 DIAGNOSIS — L729 Follicular cyst of the skin and subcutaneous tissue, unspecified: Secondary | ICD-10-CM

## 2020-01-08 DIAGNOSIS — R197 Diarrhea, unspecified: Secondary | ICD-10-CM

## 2020-01-08 NOTE — Progress Notes (Signed)
Chief Complaint  Patient presents with  . Annual Exam   F/u  1. C/o gas and diarrhea h/o pancreatitic exocrine insufficiency since gastric bypass he has tried to reduce tomatoes, avoid dairy I.e cheese as much as possible. He is on zenpep with meals. No help with probiotics in the past. When he eats tomatoes he gets epigastric pain. He reports diarrhea causes him to have excess gas is this is what bothers him as well as gas odor  Hemorrhoids not flaring currently  2. Right handed c/o right thumb popping w/o pain worse with pulling/pushing objects in the OR or playing video games or when he is on his phone and the way he holds it  Declines Xray thumb today  3. GAD 7 10 today and phq 9 score 6 trigger is #1 (above) and this is stressful for him and he just wants to get away from people due to excess gas and odor/diarrhea at times   Review of Systems  Constitutional: Negative for weight loss.  HENT: Negative for hearing loss.   Eyes: Negative for blurred vision.  Respiratory: Negative for shortness of breath.   Cardiovascular: Negative for chest pain.  Gastrointestinal: Positive for diarrhea. Negative for abdominal pain.       +gas   Musculoskeletal: Negative for falls.       +right thumb popping  Skin: Negative for rash.  Neurological: Negative for headaches.  Psychiatric/Behavioral: Negative for depression.   Past Medical History:  Diagnosis Date  . Anemia, unspecified 02/22/2016   iron deficient  . Blood in stool   . Diarrhea   . Essential hypertension 12/30/2016  . Exocrine pancreatic insufficiency 10/15/2014  . Gallstones   . Gastro-esophageal reflux 12/30/2016  . Inguinal hernia   . Sleep apnea    hx of befor weight loss surgery none now  . Vasovagal syncope   . Vitamin D deficiency 12/30/2016   Past Surgical History:  Procedure Laterality Date  . 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 CHOLECYSTECTOMY N/A 01/04/2019   Procedure:  LAPAROSCOPIC CHOLECYSTECTOMY with intraoperative cholangiogram;  Surgeon: Johnathan Hausen, MD;  Location: [facility];  Service: General;  Laterality: N/A;  . INGUINAL HERNIA REPAIR Right 01/04/2019   Procedure: OPEN RIGHT INGUINAL HERNIA REPAIR;  Surgeon: Johnathan Hausen, MD;  Location: [facility];  Service: General;  Laterality: Right;   Family History  Problem Relation Age of Onset  . Pancreatic disease Father   . Diabetes Father   . Hypertension Father   . Diabetes Mother   . Hypertension Mother   . Hypertension Brother        ?  . Prostate cancer Neg Hx   . Kidney cancer Neg Hx   . Bladder Cancer Neg Hx    Social History   Socioeconomic History  . Marital status: Married    Spouse name: Not on file  . Number of children: Not on file  . Years of education: Not on file  . Highest education level: Not on file  Occupational History  . Not on file  Tobacco Use  . Smoking status: Never Smoker  . Smokeless tobacco: Never Used  Vaping Use  . Vaping Use: Some days  Substance and Sexual Activity  . Alcohol use: Not Currently    Comment: rarely  . Drug use: No  . Sexual activity: Yes  Other Topics Concern  . Not on file  Social History Narrative   Married    Works in [employer]  OR    No guns    Wears seat belt    Safe in relationship    Social Determinants of Health   Financial Resource Strain:   . Difficulty of Paying Living Expenses:   Food Insecurity:   . Worried About Charity fundraiser in the Last Year:   . Arboriculturist in the Last Year:   Transportation Needs:   . Film/video editor (Medical):   Marland Kitchen Lack of Transportation (Non-Medical):   Physical Activity:   . Days of Exercise per Week:   . Minutes of Exercise per Session:   Stress:   . Feeling of Stress :   Social Connections:   . Frequency of Communication with Friends and Family:   . Frequency of Social Gatherings with Friends and Family:   . Attends Religious Services:   . Active Member of Clubs or  Organizations:   . Attends Archivist Meetings:   Marland Kitchen Marital Status:   Intimate Partner Violence:   . Fear of Current or Ex-Partner:   . Emotionally Abused:   Marland Kitchen Physically Abused:   . Sexually Abused:    Current Meds  Medication Sig  . Cholecalciferol 1.25 MG (50000 UT) capsule Take 1 capsule (50,000 Units total) by mouth once a week.  Marland Kitchen EPINEPHrine 0.3 mg/0.3 mL IJ SOAJ injection Inject 0.3 mLs (0.3 mg total) into the muscle once. Follow package instructions as needed for severe allergy or anaphylactic reaction. (Patient taking differently: Inject 0.3 mg into the muscle as needed for anaphylaxis. )  . fluticasone (FLONASE) 50 MCG/ACT nasal spray Place 2 sprays into both nostrils daily.  . Multiple Vitamin (MULTI-VITAMINS) TABS Take 1 tablet by mouth daily.   Marland Kitchen oxymetazoline (AFRIN NASAL SPRAY) 0.05 % nasal spray Place 1 spray into both nostrils 2 (two) times daily.  . polycarbophil (FIBERCON) 625 MG tablet Take 625 mg by mouth 3 (three) times daily.  Marland Kitchen triamterene-hydrochlorothiazide (MAXZIDE-25) 37.5-25 MG tablet Take 1 tablet by mouth daily as needed.  Marland Kitchen ZENPEP 40000-126000 units CPEP TAKE 2 CAPSULES BY MOUTH WITH EACH MEAL (BREAKFAST, LUNCH, AND DINNER)   No Known Allergies No results found for this or any previous visit (from the past 2160 hour(s)). Objective  Body mass index is 26.7 kg/m. Wt Readings from Last 3 Encounters:  01/08/20 183 lb 6.4 oz (83.2 kg)  03/20/19 187 lb 8 oz (85 kg)  01/04/19 187 lb 8 oz (85 kg)   Temp Readings from Last 3 Encounters:  01/08/20 98.1 F (36.7 C) (Oral)  01/04/19 97.6 F (36.4 C)  01/02/19 98.2 F (36.8 C) (Oral)   BP Readings from Last 3 Encounters:  01/08/20 122/70  01/04/19 138/72  01/02/19 134/73   Pulse Readings from Last 3 Encounters:  01/08/20 80  01/04/19 81  01/02/19 98    Physical Exam Vitals and nursing note reviewed.  Constitutional:      Appearance: Normal appearance. He is well-developed, well-groomed  and overweight.  HENT:     Head: Normocephalic and atraumatic.  Eyes:     Conjunctiva/sclera: Conjunctivae normal.     Pupils: Pupils are equal, round, and reactive to light.  Cardiovascular:     Rate and Rhythm: Normal rate and regular rhythm.     Heart sounds: Normal heart sounds. No murmur heard.   Pulmonary:     Effort: Pulmonary effort is normal.     Breath sounds: Normal breath sounds.  Abdominal:     General: Abdomen is flat. Bowel  sounds are normal.     Tenderness: There is no abdominal tenderness.  Skin:    General: Skin is warm and dry.  Neurological:     General: No focal deficit present.     Mental Status: He is alert and oriented to person, place, and time. Mental status is at baseline.     Gait: Gait normal.  Psychiatric:        Attention and Perception: Attention and perception normal.        Mood and Affect: Mood and affect normal.        Speech: Speech normal.        Behavior: Behavior normal. Behavior is cooperative.        Thought Content: Thought content normal.        Cognition and Memory: Cognition normal.        Judgment: Judgment normal.     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 with malodorous gas and intermittent epigastric pain -?SIBO  -disc prn gas x, prn sparingly peptobismol  Will message GI to see if  They rec further tx or work up I.e EGD/colonoscopy   Iron deficiency - Plan: CBC with Differential/Platelet, Iron, Ferritin, Iron Binding Cap (TIBC) He may need iron infusion   Elevated alkaline phosphatase level - Plan: Comprehensive metabolic panel  Vitamin D deficiency - Plan: Vitamin D (25 hydroxy) On 50K IU weekly   B12 deficiency - Plan: Vitamin B12  Trigger finger of right thumb vs tenosynovitis  Disc Xray r/o arthritis  Consider f/u ortho thumb splint from [place] and depression Disc today but no need for meds  ? If amitriptyline will help though c/w  side effect weight gain   Overweight (BMI 25.0-29.9)  rec healthy diet and exercise   HM-CPE at f/u  Flu shot utd had work [employer] had 2/2 will send card  Consider Tdap in [place] labs 07/04/19  Consider hep B in future not immune declines for now  MMR immune  Hep C neg 09/19/17  HIV neg 02/14/17 EGD 05/16/13 normal   Hemorrhoids, unspecified hemorrhoid type -if bleeding again consider referral Dr. Hassell Done or [place] established Dr. Hassell Done need to check if does surgery hemmhroids not currently an issue pt to let me know  Dr. [facility]   Provider: Dr. Olivia Mackie McLean-Scocuzza-[facility]

## 2020-01-08 NOTE — Patient Instructions (Addendum)
Consider gas X or peptobismol as needed  prilosec or pepcid over counter   Consider thumb Xray, hand orthopedics for steroid injection  -trigger finger or tenosynovitis of thumb right side    Iron Deficiency Anemia, Adult Iron-deficiency anemia is when you have a low amount of red blood cells or hemoglobin. This happens because you have too little iron in your body. Hemoglobin carries oxygen to parts of the body. Anemia can cause your body to not get enough oxygen. It may or may not cause symptoms. Follow these instructions at home: Medicines  Take over-the-counter and prescription medicines only as told by your doctor. This includes iron pills (supplements) and vitamins.  If you cannot handle taking iron pills by mouth, ask your doctor about getting iron through: ? A vein (intravenously). ? A shot (injection) into a muscle.  Take iron pills when your stomach is empty. If you cannot handle this, take them with food.  Do not drink milk or take antacids at the same time as your iron pills.  To prevent trouble pooping (constipation), eat fiber or take medicine (stool softener) as told by your doctor. Eating and drinking   Talk with your doctor before changing the foods you eat. He or she may tell you to eat foods that have a lot of iron, such as: ? Liver. ? Lowfat (lean) beef. ? Breads and cereals that have iron added to them (fortified breads and cereals). ? Eggs. ? Dried fruit. ? Dark green, leafy vegetables.  Drink enough fluid to keep your pee (urine) clear or pale yellow.  Eat fresh fruits and vegetables that are high in vitamin C. They help your body to use iron. Foods with a lot of vitamin C include: ? Oranges. ? Peppers. ? Tomatoes. ? Mangoes. General instructions  Return to your normal activities as told by your doctor. Ask your doctor what activities are safe for you.  Keep yourself clean, and keep things clean around you (your surroundings). Anemia can make you  get sick more easily.  Keep all follow-up visits as told by your doctor. This is important. Contact a doctor if:  You feel sick to your stomach (nauseous).  You throw up (vomit).  You feel weak.  You are sweating for no clear reason.  You have trouble pooping, such as: ? Pooping (having a bowel movement) less than 3 times a week. ? Straining to poop. ? Having poop that is hard, dry, or larger than normal. ? Feeling full or bloated. ? Pain in the lower belly. ? Not feeling better after pooping. Get help right away if:  You pass out (faint). If this happens, do not drive yourself to the hospital. Call your local emergency services (911 in the [place]  You have chest pain.  You have shortness of breath that: ? Is very bad. ? Gets worse with physical activity.  You have a fast heartbeat.  You get light-headed when getting up from sitting or lying down. This information is not intended to replace advice given to you by your health care provider. Make sure you discuss any questions you have with your health care provider. Document Revised: 05/12/2017 Document Reviewed: 02/17/2016 Elsevier Patient Education  [place].                                          Hand Exercises Hand exercises can be helpful  for almost anyone. These exercises can strengthen the hands, improve flexibility and movement, and increase blood flow to the hands. These results can make work and daily tasks easier. Hand exercises can be especially helpful for people who have joint pain from arthritis or have nerve damage from overuse (carpal tunnel syndrome). These exercises can also help people who have injured a hand. Exercises Most of these hand exercises are gentle stretching and motion exercises. It is usually safe to do them often throughout the day. Warming up your hands before exercise may help to reduce stiffness. You can do this with gentle massage or by  placing your hands in warm water for 10-15 minutes. It is normal to feel some stretching, pulling, tightness, or mild discomfort as you begin new exercises. This will gradually improve. Stop an exercise right away if you feel sudden, severe pain or your pain gets worse. Ask your health care provider which exercises are best for you. Knuckle bend or "claw" fist 1. Stand or sit with your arm, hand, and all five fingers pointed straight up. Make sure to keep your wrist straight during the exercise. 2. Gently bend your fingers down toward your palm until the tips of your fingers are touching the top of your palm. Keep your big knuckle straight and just bend the small knuckles in your fingers. 3. Hold this position for __________ seconds. 4. Straighten (extend) your fingers back to the starting position. Repeat this exercise 5-10 times with each hand. Full finger fist 1. Stand or sit with your arm, hand, and all five fingers pointed straight up. Make sure to keep your wrist straight during the exercise. 2. Gently bend your fingers into your palm until the tips of your fingers are touching the middle of your palm. 3. Hold this position for __________ seconds. 4. Extend your fingers back to the starting position, stretching every joint fully. Repeat this exercise 5-10 times with each hand. Straight fist 1. Stand or sit with your arm, hand, and all five fingers pointed straight up. Make sure to keep your wrist straight during the exercise. 2. Gently bend your fingers at the big knuckle, where your fingers meet your hand, and the middle knuckle. Keep the knuckle at the tips of your fingers straight and try to touch the bottom of your palm. 3. Hold this position for __________ seconds. 4. Extend your fingers back to the starting position, stretching every joint fully. Repeat this exercise 5-10 times with each hand. Tabletop 1. Stand or sit with your arm, hand, and all five fingers pointed straight up.  Make sure to keep your wrist straight during the exercise. 2. Gently bend your fingers at the big knuckle, where your fingers meet your hand, as far down as you can while keeping the small knuckles in your fingers straight. Think of forming a tabletop with your fingers. 3. Hold this position for __________ seconds. 4. Extend your fingers back to the starting position, stretching every joint fully. Repeat this exercise 5-10 times with each hand. Finger spread 1. Place your hand flat on a table with your palm facing down. Make sure your wrist stays straight as you do this exercise. 2. Spread your fingers and thumb apart from each other as far as you can until you feel a gentle stretch. Hold this position for __________ seconds. 3. Bring your fingers and thumb tight together again. Hold this position for __________ seconds. Repeat this exercise 5-10 times with each hand. Making circles 1. Stand or sit  with your arm, hand, and all five fingers pointed straight up. Make sure to keep your wrist straight during the exercise. 2. Make a circle by touching the tip of your thumb to the tip of your index finger. 3. Hold for __________ seconds. Then open your hand wide. 4. Repeat this motion with your thumb and each finger on your hand. Repeat this exercise 5-10 times with each hand. Thumb motion 1. Sit with your forearm resting on a table and your wrist straight. Your thumb should be facing up toward the ceiling. Keep your fingers relaxed as you move your thumb. 2. Lift your thumb up as high as you can toward the ceiling. Hold for __________ seconds. 3. Bend your thumb across your palm as far as you can, reaching the tip of your thumb for the small finger (pinkie) side of your palm. Hold for __________ seconds. Repeat this exercise 5-10 times with each hand. Grip strengthening  1. Hold a stress ball or other soft ball in the middle of your hand. 2. Slowly increase the pressure, squeezing the ball as much  as you can without causing pain. Think of bringing the tips of your fingers into the middle of your palm. All of your finger joints should bend when doing this exercise. 3. Hold your squeeze for __________ seconds, then relax. Repeat this exercise 5-10 times with each hand. Contact a health care provider if:  Your hand pain or discomfort gets much worse when you do an exercise.  Your hand pain or discomfort does not improve within 2 hours after you exercise. If you have any of these problems, stop doing these exercises right away. Do not do them again unless your health care provider says that you can. Get help right away if:  You develop sudden, severe hand pain or swelling. If this happens, stop doing these exercises right away. Do not do them again unless your health care provider says that you can. This information is not intended to replace advice given to you by your health care provider. Make sure you discuss any questions you have with your health care provider. Document Revised: 09/20/2018 Document Reviewed: 05/31/2018 Elsevier Patient Education  [place].  Trigger Finger  Trigger finger, also called stenosing tenosynovitis,  is a condition that causes a finger to get stuck in a bent position. Each finger has a tendon, which is a tough, cord-like tissue that connects muscle to bone, and each tendon passes through a tunnel of tissue called a tendon sheath. To move your finger, your tendon needs to glide freely through the sheath. Trigger finger happens when the tendon or the sheath thickens, making it difficult to move your finger. Trigger finger can affect any finger or a thumb. It may affect more than one finger. Mild cases may clear up with rest and medicine. Severe cases require more treatment. What are the causes? Trigger finger is caused by a thickened finger tendon or tendon sheath. The cause of this thickening is not known. What increases the risk? The following  factors may make you more likely to develop this condition:  Doing activities that require a strong grip.  Having rheumatoid arthritis, gout, or diabetes.  Being 64-35 years old.  Being male. What are the signs or symptoms? Symptoms of this condition include:  Pain when bending or straightening your finger.  Tenderness or swelling where your finger attaches to the palm of your hand.  A lump in the palm of your hand or on the  inside of your finger.  Hearing a noise like a pop or a snap when you try to straighten your finger.  Feeling a catching or locking sensation when you try to straighten your finger.  Being unable to straighten your finger. How is this diagnosed? This condition is diagnosed based on your symptoms and a physical exam. How is this treated? This condition may be treated by:  Resting your finger and avoiding activities that make symptoms worse.  Wearing a finger splint to keep your finger extended.  Taking NSAIDs, such as ibuprofen, to relieve pain and swelling.  Doing gentle exercises to stretch the finger as told by your health care provider.  Having medicine that reduces swelling and inflammation (steroids) injected into the tendon sheath. Injections may need to be repeated.  Having surgery to open the tendon sheath. This may be done if other treatments do not work and you cannot straighten your finger. You may need physical therapy after surgery. Follow these instructions at home: If you have a splint:  Wear the splint as told by your health care provider. Remove it only as told by your health care provider.  Loosen it if your fingers tingle, become numb, or turn cold and blue.  Keep it clean.  If the splint is not waterproof: ? Do not let it get wet. ? Cover it with a watertight covering when you take a bath or shower. Managing pain, stiffness, and swelling     If directed, apply heat to the affected area as often as told by your health  care provider. Use the heat source that your health care provider recommends, such as a moist heat pack or a heating pad.  Place a towel between your skin and the heat source.  Leave the heat on for 20-30 minutes.  Remove the heat if your skin turns bright red. This is especially important if you are unable to feel pain, heat, or cold. You may have a greater risk of getting burned. If directed, put ice on the painful area. To do this:  If you have a removable splint, remove it as told by your health care provider.  Put ice in a plastic bag.  Place a towel between your skin and the bag or between your splint and the bag.  Leave the ice on for 20 minutes, 2-3 times a day.  Activity  Rest your finger as told by your health care provider. Avoid activities that make the pain worse.  Return to your normal activities as told by your health care provider. Ask your health care provider what activities are safe for you.  Do exercises as told by your health care provider.  Ask your health care provider when it is safe to drive if you have a splint on your hand. General instructions  Take over-the-counter and prescription medicines only as told by your health care provider.  Keep all follow-up visits as told by your health care provider. This is important. Contact a health care provider if:  Your symptoms are not improving with home care. Summary  Trigger finger, also called stenosing tenosynovitis, causes your finger to get stuck in a bent position. This can make it difficult and painful to straighten your finger.  This condition develops when a finger tendon or tendon sheath thickens.  Treatment may include resting your finger, wearing a splint, and taking medicines.  In severe cases, surgery to open the tendon sheath may be needed. This information is not intended to replace advice  given to you by your health care provider. Make sure you discuss any questions you have with your  health care provider. Document Revised: 10/15/2018 Document Reviewed: 10/15/2018 Elsevier Patient Education  [place].

## 2020-01-08 NOTE — Progress Notes (Signed)
Patient flagged: Current status:  PATIENT IS OVERDUE FOR BMI FOLLOW UP PLAN BMI is estimated to be 26.7 based on the last recorded weight and height

## 2020-01-09 ENCOUNTER — Encounter: Payer: Self-pay | Admitting: Internal Medicine

## 2020-01-09 ENCOUNTER — Other Ambulatory Visit: Payer: Self-pay | Admitting: Internal Medicine

## 2020-01-09 ENCOUNTER — Other Ambulatory Visit: Payer: Self-pay

## 2020-01-09 DIAGNOSIS — D72820 Lymphocytosis (symptomatic): Secondary | ICD-10-CM

## 2020-01-09 DIAGNOSIS — E559 Vitamin D deficiency, unspecified: Secondary | ICD-10-CM

## 2020-01-09 DIAGNOSIS — R748 Abnormal levels of other serum enzymes: Secondary | ICD-10-CM

## 2020-01-09 DIAGNOSIS — E611 Iron deficiency: Secondary | ICD-10-CM

## 2020-01-09 LAB — COMPREHENSIVE METABOLIC PANEL
ALT: 21 U/L (ref 0–53)
AST: 22 U/L (ref 0–37)
Albumin: 4.4 g/dL (ref 3.5–5.2)
Alkaline Phosphatase: 134 U/L — ABNORMAL HIGH (ref 39–117)
BUN: 21 mg/dL (ref 6–23)
CO2: 25 mEq/L (ref 19–32)
Calcium: 9.3 mg/dL (ref 8.4–10.5)
Chloride: 105 mEq/L (ref 96–112)
Creatinine, Ser: 0.95 mg/dL (ref 0.40–1.50)
GFR: 110.75 mL/min (ref >60.00)
Glucose, Bld: 86 mg/dL (ref 70–99)
Potassium: 4.3 mEq/L (ref 3.5–5.1)
Sodium: 136 mEq/L (ref 135–145)
Total Bilirubin: 0.7 mg/dL (ref 0.2–1.2)
Total Protein: 7.2 g/dL (ref 6.0–8.3)

## 2020-01-09 LAB — CBC WITH DIFFERENTIAL/PLATELET
Basophils Absolute: 0 10*3/uL (ref 0.0–0.1)
Basophils Relative: 0.5 % (ref 0.0–3.0)
Eosinophils Absolute: 0.1 10*3/uL (ref 0.0–0.7)
Eosinophils Relative: 1.4 % (ref 0.0–5.0)
HCT: 37.1 % — ABNORMAL LOW (ref 39.0–52.0)
Hemoglobin: 12.2 g/dL — ABNORMAL LOW (ref 13.0–17.0)
Lymphocytes Relative: 62.5 % — ABNORMAL HIGH (ref 12.0–46.0)
Lymphs Abs: 2.8 10*3/uL (ref 0.7–4.0)
MCHC: 32.8 g/dL (ref 30.0–36.0)
MCV: 87.2 fl (ref 78.0–100.0)
Monocytes Absolute: 0.3 10*3/uL (ref 0.1–1.0)
Monocytes Relative: 6.8 % (ref 3.0–12.0)
Neutro Abs: 1.3 10*3/uL — ABNORMAL LOW (ref 1.4–7.7)
Neutrophils Relative %: 28.8 % — ABNORMAL LOW (ref 43.0–77.0)
Platelets: 198 10*3/uL (ref 150.0–400.0)
RBC: 4.25 Mil/uL (ref 4.22–5.81)
RDW: 14 % (ref 11.5–15.5)
WBC: 4.5 10*3/uL (ref 4.0–10.5)

## 2020-01-09 LAB — IRON, TOTAL/TOTAL IRON BINDING CAP: Iron: 50 ug/dL (ref 50–180)

## 2020-01-09 LAB — VITAMIN B12: Vitamin B-12: 902 pg/mL (ref 211–911)

## 2020-01-09 LAB — VITAMIN D 25 HYDROXY (VIT D DEFICIENCY, FRACTURES): VITD: 27.18 ng/mL — ABNORMAL LOW (ref 30.00–100.00)

## 2020-01-09 LAB — FERRITIN: Ferritin: 55 ng/mL (ref 38–380)

## 2020-01-09 MED ORDER — CHOLECALCIFEROL 1.25 MG (50000 UT) PO CAPS: 50000.0000 [IU] | ORAL_CAPSULE | ORAL | 2 refills | Status: DC

## 2020-01-13 NOTE — Progress Notes (Signed)
Hi, Sorry, today was my first day back from vacation. I will follow up with the patient for further investigations.  Thanks.

## 2020-01-13 NOTE — Progress Notes (Signed)
Can you set this patient up for a breath test for bacteria overgrowth. He has chronic diarrhea and bloating. Thanks.

## 2020-01-14 ENCOUNTER — Telehealth: Payer: Self-pay

## 2020-01-14 NOTE — Telephone Encounter (Signed)
Left vm for pt to return my call to set up the SIBO test. Pt will need to stop by the office to sign the requisition.

## 2020-01-14 NOTE — Telephone Encounter (Signed)
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

## 2020-02-05 ENCOUNTER — Other Ambulatory Visit: Payer: Self-pay

## 2020-02-05 ENCOUNTER — Other Ambulatory Visit: Payer: Self-pay | Admitting: Gastroenterology

## 2020-02-05 MED ORDER — RIFAXIMIN 550 MG PO TABS: 550.0000 mg | ORAL_TABLET | Freq: Three times a day (TID) | ORAL | 0 refills | Status: DC

## 2020-04-13 ENCOUNTER — Other Ambulatory Visit: Payer: Self-pay

## 2020-04-13 ENCOUNTER — Other Ambulatory Visit (INDEPENDENT_AMBULATORY_CARE_PROVIDER_SITE_OTHER): Payer: No Typology Code available for payment source

## 2020-04-13 DIAGNOSIS — D72820 Lymphocytosis (symptomatic): Secondary | ICD-10-CM

## 2020-04-13 DIAGNOSIS — E559 Vitamin D deficiency, unspecified: Secondary | ICD-10-CM | POA: Diagnosis not present

## 2020-04-13 NOTE — Addendum Note (Signed)
Addended by: Elpidio Galea T on: 04/13/2020 03:00 PM   Modules accepted: Orders

## 2020-04-13 NOTE — Addendum Note (Signed)
Addended by: Elpidio Galea T on: 04/13/2020 02:59 PM   Modules accepted: Orders

## 2020-04-14 LAB — CBC WITH DIFFERENTIAL/PLATELET
Basophils Absolute: 0 10*3/uL (ref 0.0–0.1)
Basophils Relative: 0.6 % (ref 0.0–3.0)
Eosinophils Absolute: 0.1 10*3/uL (ref 0.0–0.7)
Eosinophils Relative: 1.9 % (ref 0.0–5.0)
HCT: 34.2 % — ABNORMAL LOW (ref 39.0–52.0)
Hemoglobin: 11.2 g/dL — ABNORMAL LOW (ref 13.0–17.0)
Lymphocytes Relative: 54.8 % — ABNORMAL HIGH (ref 12.0–46.0)
Lymphs Abs: 2 10*3/uL (ref 0.7–4.0)
MCHC: 32.8 g/dL (ref 30.0–36.0)
MCV: 87.8 fl (ref 78.0–100.0)
Monocytes Absolute: 0.3 10*3/uL (ref 0.1–1.0)
Monocytes Relative: 7.9 % (ref 3.0–12.0)
Neutro Abs: 1.3 10*3/uL — ABNORMAL LOW (ref 1.4–7.7)
Neutrophils Relative %: 34.8 % — ABNORMAL LOW (ref 43.0–77.0)
Platelets: 169 10*3/uL (ref 150.0–400.0)
RBC: 3.9 Mil/uL — ABNORMAL LOW (ref 4.22–5.81)
RDW: 13.5 % (ref 11.5–15.5)
WBC: 3.7 10*3/uL — ABNORMAL LOW (ref 4.0–10.5)

## 2020-04-14 LAB — VITAMIN D 25 HYDROXY (VIT D DEFICIENCY, FRACTURES): VITD: 21.15 ng/mL — ABNORMAL LOW (ref 30.00–100.00)

## 2020-04-20 DIAGNOSIS — D72819 Decreased white blood cell count, unspecified: Secondary | ICD-10-CM | POA: Insufficient documentation

## 2020-04-20 DIAGNOSIS — D649 Anemia, unspecified: Secondary | ICD-10-CM | POA: Insufficient documentation

## 2020-04-20 NOTE — Addendum Note (Signed)
Addended by: Orland Mustard on: 04/20/2020 07:26 PM   Modules accepted: Orders

## 2020-04-28 ENCOUNTER — Encounter: Payer: Self-pay | Admitting: Oncology

## 2020-04-28 ENCOUNTER — Inpatient Hospital Stay: Payer: No Typology Code available for payment source | Attending: Oncology | Admitting: Oncology

## 2020-04-28 ENCOUNTER — Inpatient Hospital Stay: Payer: No Typology Code available for payment source

## 2020-04-28 VITALS — BP 114/62 | HR 75 | Temp 99.5°F | Resp 16 | Ht 69.49 in | Wt 189.3 lb

## 2020-04-28 DIAGNOSIS — D509 Iron deficiency anemia, unspecified: Secondary | ICD-10-CM | POA: Insufficient documentation

## 2020-04-28 DIAGNOSIS — D708 Other neutropenia: Secondary | ICD-10-CM

## 2020-04-28 DIAGNOSIS — K219 Gastro-esophageal reflux disease without esophagitis: Secondary | ICD-10-CM | POA: Insufficient documentation

## 2020-04-28 DIAGNOSIS — D649 Anemia, unspecified: Secondary | ICD-10-CM

## 2020-04-28 DIAGNOSIS — K449 Diaphragmatic hernia without obstruction or gangrene: Secondary | ICD-10-CM | POA: Diagnosis not present

## 2020-04-28 DIAGNOSIS — G4730 Sleep apnea, unspecified: Secondary | ICD-10-CM | POA: Diagnosis not present

## 2020-04-28 DIAGNOSIS — E559 Vitamin D deficiency, unspecified: Secondary | ICD-10-CM | POA: Insufficient documentation

## 2020-04-28 DIAGNOSIS — Z9884 Bariatric surgery status: Secondary | ICD-10-CM | POA: Insufficient documentation

## 2020-04-28 DIAGNOSIS — I10 Essential (primary) hypertension: Secondary | ICD-10-CM | POA: Insufficient documentation

## 2020-04-28 DIAGNOSIS — D72819 Decreased white blood cell count, unspecified: Secondary | ICD-10-CM | POA: Diagnosis not present

## 2020-04-28 DIAGNOSIS — D709 Neutropenia, unspecified: Secondary | ICD-10-CM | POA: Insufficient documentation

## 2020-04-28 LAB — RETICULOCYTES
Immature Retic Fract: 6.8 % (ref 2.3–15.9)
RBC.: 3.97 MIL/uL — ABNORMAL LOW (ref 4.22–5.81)
Retic Count, Absolute: 54.4 10*3/uL (ref 19.0–186.0)
Retic Ct Pct: 1.4 % (ref 0.4–3.1)

## 2020-04-28 LAB — CBC WITH DIFFERENTIAL/PLATELET
Abs Immature Granulocytes: 0.01 10*3/uL (ref 0.00–0.07)
Basophils Absolute: 0 10*3/uL (ref 0.0–0.1)
Basophils Relative: 1 %
Eosinophils Absolute: 0 10*3/uL (ref 0.0–0.5)
Eosinophils Relative: 1 %
HCT: 34.9 % — ABNORMAL LOW (ref 39.0–52.0)
Hemoglobin: 11.5 g/dL — ABNORMAL LOW (ref 13.0–17.0)
Immature Granulocytes: 0 %
Lymphocytes Relative: 59 %
Lymphs Abs: 2.6 10*3/uL (ref 0.7–4.0)
MCH: 28.8 pg (ref 26.0–34.0)
MCHC: 33 g/dL (ref 30.0–36.0)
MCV: 87.3 fL (ref 80.0–100.0)
Monocytes Absolute: 0.3 10*3/uL (ref 0.1–1.0)
Monocytes Relative: 6 %
Neutro Abs: 1.5 10*3/uL — ABNORMAL LOW (ref 1.7–7.7)
Neutrophils Relative %: 33 %
Platelets: 199 10*3/uL (ref 150–400)
RBC: 4 MIL/uL — ABNORMAL LOW (ref 4.22–5.81)
RDW: 13.3 % (ref 11.5–15.5)
WBC: 4.4 10*3/uL (ref 4.0–10.5)
nRBC: 0 % (ref 0.0–0.2)

## 2020-04-28 LAB — COMPREHENSIVE METABOLIC PANEL
ALT: 29 U/L (ref 0–44)
AST: 29 U/L (ref 15–41)
Albumin: 4 g/dL (ref 3.5–5.0)
Alkaline Phosphatase: 85 U/L (ref 38–126)
Anion gap: 7 (ref 5–15)
BUN: 11 mg/dL (ref 6–20)
CO2: 24 mmol/L (ref 22–32)
Calcium: 8.6 mg/dL — ABNORMAL LOW (ref 8.9–10.3)
Chloride: 109 mmol/L (ref 98–111)
Creatinine, Ser: 1.01 mg/dL (ref 0.61–1.24)
GFR, Estimated: >60 mL/min (ref >60)
Glucose, Bld: 76 mg/dL (ref 70–99)
Potassium: 4.2 mmol/L (ref 3.5–5.1)
Sodium: 140 mmol/L (ref 135–145)
Total Bilirubin: 0.8 mg/dL (ref 0.3–1.2)
Total Protein: 6.9 g/dL (ref 6.5–8.1)

## 2020-04-28 LAB — TECHNOLOGIST SMEAR REVIEW
Plt Morphology: NORMAL
RBC Morphology: NORMAL
WBC Morphology: NORMAL

## 2020-04-28 LAB — TSH: TSH: 1.743 u[IU]/mL (ref 0.350–4.500)

## 2020-04-28 LAB — FOLATE: Folate: 13.5 ng/mL (ref >5.9)

## 2020-04-28 NOTE — Progress Notes (Signed)
Pt has been sent here for leukopenia- pt has no side effect from having low level. Pt occasionally sinus HA but not very often. The pt. States that since he had wt loss surgery- he no  Longer has HTN, or slee[p apnea

## 2020-04-29 LAB — MULTIPLE MYELOMA PANEL, SERUM
Albumin SerPl Elph-Mcnc: 3.7 g/dL (ref 2.9–4.4)
Albumin/Glob SerPl: 1.4 (ref 0.7–1.7)
Alpha 1: 0.2 g/dL (ref 0.0–0.4)
Alpha2 Glob SerPl Elph-Mcnc: 0.4 g/dL (ref 0.4–1.0)
B-Globulin SerPl Elph-Mcnc: 0.8 g/dL (ref 0.7–1.3)
Gamma Glob SerPl Elph-Mcnc: 1.3 g/dL (ref 0.4–1.8)
Globulin, Total: 2.7 g/dL (ref 2.2–3.9)
IgA: 238 mg/dL (ref 90–386)
IgG (Immunoglobin G), Serum: 1349 mg/dL (ref 603–1613)
IgM (Immunoglobulin M), Srm: 166 mg/dL (ref 20–172)
Total Protein ELP: 6.4 g/dL (ref 6.0–8.5)

## 2020-04-29 LAB — HIV ANTIBODY (ROUTINE TESTING W REFLEX): HIV Screen 4th Generation wRfx: NONREACTIVE

## 2020-04-29 LAB — HAPTOGLOBIN: Haptoglobin: <10 mg/dL — ABNORMAL LOW (ref 17–317)

## 2020-04-29 LAB — HEPATITIS C ANTIBODY: HCV Ab: <0.1 s/co ratio — AB (ref 0.0–0.9)

## 2020-05-01 ENCOUNTER — Encounter: Payer: Self-pay | Admitting: Oncology

## 2020-05-01 LAB — COPPER, SERUM: Copper: 12 ug/dL — ABNORMAL LOW (ref 69–132)

## 2020-05-01 NOTE — Progress Notes (Signed)
Hematology/Oncology Consult note [facility] [phone] [phone]  Patient Care Team: McLean-Scocuzza, Nino Glow, MD as PCP - General (Internal Medicine)   Name of the patient: Thomas Shaw  [id number]  04-22-1986    Reason for referral-neutropenia   Referring physician-Dr. Mclean-Scocuzza  Date of visit: 05/01/20   History of presenting illness-patient is a 34 year old African-American male with a history of sleeve gastrectomy with duodenal switch in 2015.  Other medical problems include hypertension and GERD.  He has been referred to [place] for neutropenia.  Most recent CBC from 04/13/2020 showed white count of 3.7, H&H of 11.2/34.2 with a platelet count of 169.  Differential showed mild neutropenia with an ANC of 1.3 with relative lymphocytosis but absolute lymphocyte count was normal.  Looking back at his prior CBCs his white count does wax and wane between 2.8-14 and he has had mild intermittent neutropenia in the past.  He denies any recurrent infections or hospitalizations.  Denies any unintentional weight loss.  Denies any recent change in his medications.  Patient has also had mild chronic anemia and his hemoglobin typically runs around 11.  ECOG PS- 0  Pain scale- 0   Review of systems- Review of Systems  Constitutional: Negative for chills, fever, malaise/fatigue and weight loss.  HENT: Negative for congestion, ear discharge and nosebleeds.   Eyes: Negative for blurred vision.  Respiratory: Negative for cough, hemoptysis, sputum production, shortness of breath and wheezing.   Cardiovascular: Negative for chest pain, palpitations, orthopnea and claudication.  Gastrointestinal: Negative for abdominal pain, blood in stool, constipation, diarrhea, heartburn, melena, nausea and vomiting.  Genitourinary: Negative for dysuria, flank pain, frequency, hematuria and urgency.  Musculoskeletal: Negative for back pain, joint pain and myalgias.   Skin: Negative for rash.  Neurological: Negative for dizziness, tingling, focal weakness, seizures, weakness and headaches.  Endo/Heme/Allergies: Does not bruise/bleed easily.  Psychiatric/Behavioral: Negative for depression and suicidal ideas. The patient does not have insomnia.     No Known Allergies  Patient Active Problem List   Diagnosis Date Noted  . Leukopenia 04/20/2020  . Anemia 04/20/2020  . Excessive gas 01/08/2020  . Diarrhea due to malabsorption 01/08/2020  . Overweight (BMI 25.0-29.9) 01/08/2020  . Lymphedema 03/20/2019  . Annual physical exam 03/20/2019  . Rectal bleeding 10/17/2018  . Iron deficiency anemia due to chronic blood loss 10/15/2018  . Syncope and collapse 10/14/2018  . Rectal pain 09/05/2018  . Allergic rhinitis 09/05/2018  . 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 Indigestion 07/19/2018  . Hemorrhoids 07/19/2018  . S/P gastric surgery 07/04/2018  . Iron deficiency 07/04/2018  . Chronic diarrhea 07/04/2018  . Right inguinal hernia 07/04/2018  . Blood in stool 07/04/2018  . Essential hypertension 12/30/2016  . Obstructive sleep apnea of adult 12/30/2016  . Gastro-esophageal reflux 12/30/2016  . Vitamin D deficiency 12/30/2016  . Anemia, unspecified 02/22/2016  . Bilateral leg paresthesia 02/22/2016  . Epigastric pain 04/15/2015  . Exocrine pancreatic insufficiency 10/15/2014     Past Medical History:  Diagnosis Date  . Anemia, unspecified 02/22/2016   iron deficient  . Blood in stool   . Diarrhea   . Essential hypertension 12/30/2016  . Exocrine pancreatic insufficiency 10/15/2014  . Gallstones   . Gastro-esophageal reflux 12/30/2016  . Inguinal hernia   . Leukopenia   . Sleep apnea    hx of befor weight loss surgery none now  . Vasovagal syncope   . Vitamin D deficiency 12/30/2016     Past Surgical  History:  Procedure Laterality Date  . 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 CHOLECYSTECTOMY N/A  01/04/2019   Procedure: LAPAROSCOPIC CHOLECYSTECTOMY with intraoperative cholangiogram;  Surgeon: Johnathan Hausen, MD;  Location: [facility];  Service: General;  Laterality: N/A;  . INGUINAL HERNIA REPAIR Right 01/04/2019   Procedure: OPEN RIGHT INGUINAL HERNIA REPAIR;  Surgeon: Johnathan Hausen, MD;  Location: [facility];  Service: General;  Laterality: Right;    Social History   Socioeconomic History  . Marital status: Married    Spouse name: Not on file  . Number of children: Not on file  . Years of education: Not on file  . Highest education level: Not on file  Occupational History  . Not on file  Tobacco Use  . Smoking status: Never Smoker  . Smokeless tobacco: Never Used  Vaping Use  . Vaping Use: Some days  Substance and Sexual Activity  . Alcohol use: Not Currently    Comment: rarely  . Drug use: No  . Sexual activity: Yes  Other Topics Concern  . Not on file  Social History Narrative   Married    Works in [employer] [place]    No guns    Wears seat belt    Safe in relationship    Social Determinants of Health   Financial Resource Strain:   . Difficulty of Paying Living Expenses: Not on file  Food Insecurity:   . Worried About Charity fundraiser in the Last Year: Not on file  . Ran Out of Food in the Last Year: Not on file  Transportation Needs:   . Lack of Transportation (Medical): Not on file  . Lack of Transportation (Non-Medical): Not on file  Physical Activity:   . Days of Exercise per Week: Not on file  . Minutes of Exercise per Session: Not on file  Stress:   . Feeling of Stress : Not on file  Social Connections:   . Frequency of Communication with Friends and Family: Not on file  . Frequency of Social Gatherings with Friends and Family: Not on file  . Attends Religious Services: Not on file  . Active Member of Clubs or Organizations: Not on file  . Attends Archivist Meetings: Not on file  . Marital Status: Not on file  Intimate Partner Violence:   .  Fear of Current or Ex-Partner: Not on file  . Emotionally Abused: Not on file  . Physically Abused: Not on file  . Sexually Abused: Not on file     Family History  Problem Relation Age of Onset  . Pancreatic disease Father   . Diabetes Father   . Hypertension Father   . Diabetes Mother   . Hypertension Mother   . Hypertension Brother        ?  . Prostate cancer Neg Hx   . Kidney cancer Neg Hx   . Bladder Cancer Neg Hx      Current Outpatient Medications:  .  Cholecalciferol 1.25 MG (50000 UT) capsule, Take 1 capsule (50,000 Units total) by mouth 2 (two) times a week., Disp: 18 capsule, Rfl: 2 .  fluticasone (FLONASE) 50 MCG/ACT nasal spray, Place 2 sprays into both nostrils daily., Disp: 16 g, Rfl: 12 .  Multiple Vitamin (MULTI-VITAMINS) TABS, Take 1 tablet by mouth daily. , Disp: , Rfl:  .  oxymetazoline (AFRIN NASAL SPRAY) 0.05 % nasal spray, Place 1 spray into both nostrils 2 (two) times daily. (Patient taking  differently: Place 1 spray into both nostrils as needed. ), Disp: 30 mL, Rfl: 0 .  polycarbophil (FIBERCON) 625 MG tablet, Take 625 mg by mouth daily., Disp: , Rfl:  .  EPINEPHrine 0.3 mg/0.3 mL IJ SOAJ injection, Inject 0.3 mLs (0.3 mg total) into the muscle once. Follow package instructions as needed for severe allergy or anaphylactic reaction. (Patient not taking: Reported on 04/28/2020), Disp: 1 Device, Rfl: 2   Physical exam:  Vitals:   04/28/20 1509  BP: 114/62  Pulse: 75  Resp: 16  Temp: 99.5 F (37.5 C)  TempSrc: Tympanic  Weight: 189 lb 4.8 oz (85.9 kg)  Height: 5' 9.49" (1.765 m)   Physical Exam Constitutional:      General: He is not in acute distress. Eyes:     Pupils: Pupils are equal, round, and reactive to light.  Cardiovascular:     Rate and Rhythm: Normal rate and regular rhythm.     Heart sounds: Normal heart sounds.  Pulmonary:     Effort: Pulmonary effort is normal.     Breath sounds: Normal breath sounds.  Abdominal:     General:  Bowel sounds are normal.     Palpations: Abdomen is soft.     Comments: No palpable hepatosplenomegaly  Lymphadenopathy:     Comments: No palpable cervical, supraclavicular, axillary or inguinal adenopathy   Skin:    General: Skin is warm and dry.  Neurological:     Mental Status: He is alert and oriented to person, place, and time.        CMP Latest Ref Rng & Units 04/28/2020  Glucose 70 - 99 mg/dL 76  BUN 6 - 20 mg/dL 11  Creatinine 0.61 - 1.24 mg/dL 1.01  Sodium 135 - 145 mmol/L 140  Potassium 3.5 - 5.1 mmol/L 4.2  Chloride 98 - 111 mmol/L 109  CO2 22 - 32 mmol/L 24  Calcium 8.9 - 10.3 mg/dL 8.6(L)  Total Protein 6.5 - 8.1 g/dL 6.9  Total Bilirubin 0.3 - 1.2 mg/dL 0.8  Alkaline Phos 38 - 126 U/L 85  AST 15 - 41 U/L 29  ALT 0 - 44 U/L 29   CBC Latest Ref Rng & Units 04/28/2020  WBC 4.0 - 10.5 K/uL 4.4  Hemoglobin 13.0 - 17.0 g/dL 11.5(L)  Hematocrit 39 - 52 % 34.9(L)  Platelets 150 - 400 K/uL 199    Assessment and plan- Patient is a 34 y.o. male referred for neutropenia  Neutropenia: Patient has had a mild waxing and waning neutropenia at least dating back to 2015 and his ANC typically ranges between 1.3-2.5.  He has not had any recurrent infections or hospitalizations.  There has been no consistent downward trend in his neutropenia.  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  However I will check an HIV hepatitis C level as well as folate smear review.B12 levels in July 2021 were normal.  He is not on any medications that would cause neutropenia  Mild normocytic anemia: Given his history of gastric bypass I would like to check a B1 B6 and copper level.  He recently had iron studies done which were normal with a ferritin level of 55  Video visit with me in 2 weeks time  Thank you for this kind referral and the opportunity to participate in the care of this patient   Visit Diagnosis 1. Other neutropenia ([place])   2. Normocytic  anemia     Dr. Randa Evens, MD, MPH [facility]  at [facility] [phone] 05/01/2020  9:08 AM

## 2020-05-02 LAB — VITAMIN B1: Vitamin B1 (Thiamine): 113.4 nmol/L (ref 66.5–200.0)

## 2020-05-03 LAB — VITAMIN B6: Vitamin B6: 24.9 ug/L (ref 5.3–46.7)

## 2020-05-11 ENCOUNTER — Other Ambulatory Visit: Payer: Self-pay

## 2020-05-11 ENCOUNTER — Telehealth: Payer: Self-pay | Admitting: *Deleted

## 2020-05-11 ENCOUNTER — Inpatient Hospital Stay (HOSPITAL_BASED_OUTPATIENT_CLINIC_OR_DEPARTMENT_OTHER): Payer: No Typology Code available for payment source | Admitting: Oncology

## 2020-05-11 DIAGNOSIS — D649 Anemia, unspecified: Secondary | ICD-10-CM

## 2020-05-11 DIAGNOSIS — D708 Other neutropenia: Secondary | ICD-10-CM

## 2020-05-11 MED ORDER — COPPER CAPS 2 MG PO CAPS: 2.0000 mg | ORAL_CAPSULE | Freq: Every day | ORAL | 3 refills | Status: DC

## 2020-05-11 NOTE — Telephone Encounter (Signed)
Pharmacy called reporting that they cannot get or even order the Copper Gluconate and ask for a return call

## 2020-05-11 NOTE — Telephone Encounter (Signed)
Sherry/niki- will look into alternative pharmacy who can do it

## 2020-05-12 ENCOUNTER — Telehealth: Payer: No Typology Code available for payment source | Admitting: Oncology

## 2020-05-12 ENCOUNTER — Encounter: Payer: Self-pay | Admitting: Oncology

## 2020-05-12 NOTE — Telephone Encounter (Signed)
I see that this is considered a dietary supplement and can be purchased at [facility], [facility] or [facility]

## 2020-05-12 NOTE — Progress Notes (Signed)
I connected with Thomas Shaw on 05/12/20 at  2:30 PM EST by video enabled telemedicine visit and verified that I am speaking with the correct person using two identifiers.   I discussed the limitations, risks, security and privacy concerns of performing an evaluation and management service by telemedicine and the availability of in-person appointments. I also discussed with the patient that there may be a patient responsible charge related to this service. The patient expressed understanding and agreed to proceed.  There were problems during the video connection due to which it had to be switched to telephone call  Other persons participating in the visit and their role in the encounter:  none  Patient's location:  work Provider's location:  work  Risk analyst Complaint: Discuss results of blood work  History of present illness: patient is a 34 year old African-American male with a history of sleeve gastrectomy with duodenal switch in 2015.  Other medical problems include hypertension and GERD.  He has been referred to [place] for neutropenia.  Most recent CBC from 04/13/2020 showed white count of 3.7, H&H of 11.2/34.2 with a platelet count of 169.  Differential showed mild neutropenia with an ANC of 1.3 with relative lymphocytosis but absolute lymphocyte count was normal.  Looking back at his prior CBCs his white count does wax and wane between 2.8-14 and he has had mild intermittent neutropenia in the past.  He denies any recurrent infections or hospitalizations.  Denies any unintentional weight loss.  Denies any recent change in his medications.  Patient has also had mild chronic anemia and his hemoglobin typically runs around 11.  Results of blood work from 04/28/2020 were as follows: CBC showed white count of 4.4, H&H of 11.5/34.9 with an MCV of 87 and a platelet count of 199.  ANC was 1.5.  CMP, folate, reticulocyte count, TSH was within normal limits.  HIV and hepatitis C were negative.  B1 B6 was  normal.  Myeloma panel showed no M protein.  Haptoglobin was less than 10.  Smear review was unremarkable.  Copper levels were low at 12.  Interval history: No new complaints since the last visit   Review of Systems  Constitutional: Negative for chills, fever, malaise/fatigue and weight loss.  HENT: Negative for congestion, ear discharge and nosebleeds.   Eyes: Negative for blurred vision.  Respiratory: Negative for cough, hemoptysis, sputum production, shortness of breath and wheezing.   Cardiovascular: Negative for chest pain, palpitations, orthopnea and claudication.  Gastrointestinal: Negative for abdominal pain, blood in stool, constipation, diarrhea, heartburn, melena, nausea and vomiting.  Genitourinary: Negative for dysuria, flank pain, frequency, hematuria and urgency.  Musculoskeletal: Negative for back pain, joint pain and myalgias.  Skin: Negative for rash.  Neurological: Negative for dizziness, tingling, focal weakness, seizures, weakness and headaches.  Endo/Heme/Allergies: Does not bruise/bleed easily.  Psychiatric/Behavioral: Negative for depression and suicidal ideas. The patient does not have insomnia.     No Known Allergies  Past Medical History:  Diagnosis Date  . Anemia, unspecified 02/22/2016   iron deficient  . Blood in stool   . Diarrhea   . Essential hypertension 12/30/2016  . Exocrine pancreatic insufficiency 10/15/2014  . Gallstones   . Gastro-esophageal reflux 12/30/2016  . Inguinal hernia   . Leukopenia   . Sleep apnea    hx of befor weight loss surgery none now  . Vasovagal syncope   . Vitamin D deficiency 12/30/2016    Past Surgical History:  Procedure Laterality Date  . BARIATRIC SURGERY  07/31/2013  sleeve gastrectomy with duodenal switch  . CHOLECYSTECTOMY N/A 01/04/2019   Procedure: LAPAROSCOPIC CHOLECYSTECTOMY with intraoperative cholangiogram;  Surgeon: Johnathan Hausen, MD;  Location: [facility];  Service: General;  Laterality: N/A;  . INGUINAL  HERNIA REPAIR Right 01/04/2019   Procedure: OPEN RIGHT INGUINAL HERNIA REPAIR;  Surgeon: Johnathan Hausen, MD;  Location: [facility];  Service: General;  Laterality: Right;    Social History   Socioeconomic History  . Marital status: Married    Spouse name: Not on file  . Number of children: Not on file  . Years of education: Not on file  . Highest education level: Not on file  Occupational History  . Not on file  Tobacco Use  . Smoking status: Never Smoker  . Smokeless tobacco: Never Used  Vaping Use  . Vaping Use: Some days  Substance and Sexual Activity  . Alcohol use: Not Currently    Comment: rarely  . Drug use: No  . Sexual activity: Yes  Other Topics Concern  . Not on file  Social History Narrative   Married    Works in [employer] [place] patient is a 34 year old African-American male with a past medical history significant for sleeve gastrectomy with duodenal switch back in 2015.  Other medical problems include hypertension, GERD   No guns    Wears seat belt    Safe in relationship    Social Determinants of Health   Financial Resource Strain:   . Difficulty of Paying Living Expenses: Not on file  Food Insecurity:   . Worried About Charity fundraiser in the Last Year: Not on file  . Ran Out of Food in the Last Year: Not on file  Transportation Needs:   . Lack of Transportation (Medical): Not on file  . Lack of Transportation (Non-Medical): Not on file  Physical Activity:   . Days of Exercise per Week: Not on file  . Minutes of Exercise per Session: Not on file  Stress:   . Feeling of Stress : Not on file  Social Connections:   . Frequency of Communication with Friends and Family: Not on file  . Frequency of Social Gatherings with Friends and Family: Not on file  . Attends Religious Services: Not on file  . Active Member of Clubs or Organizations: Not on file  . Attends Archivist Meetings: Not on file  . Marital Status: Not on file  Intimate Partner Violence:    . Fear of Current or Ex-Partner: Not on file  . Emotionally Abused: Not on file  . Physically Abused: Not on file  . Sexually Abused: Not on file    Family History  Problem Relation Age of Onset  . Pancreatic disease Father   . Diabetes Father   . Hypertension Father   . Diabetes Mother   . Hypertension Mother   . Hypertension Brother        ?  . Prostate cancer Neg Hx   . Kidney cancer Neg Hx   . Bladder Cancer Neg Hx      Current Outpatient Medications:  .  Cholecalciferol 1.25 MG (50000 UT) capsule, Take 1 capsule (50,000 Units total) by mouth 2 (two) times a week., Disp: 18 capsule, Rfl: 2 .  fluticasone (FLONASE) 50 MCG/ACT nasal spray, Place 2 sprays into both nostrils daily., Disp: 16 g, Rfl: 12 .  Multiple Vitamin (MULTI-VITAMINS) TABS, Take 1 tablet by mouth daily. , Disp: , Rfl:  .  oxymetazoline (AFRIN NASAL SPRAY) 0.05 % nasal  spray, Place 1 spray into both nostrils 2 (two) times daily., Disp: 30 mL, Rfl: 0 .  polycarbophil (FIBERCON) 625 MG tablet, Take 625 mg by mouth daily., Disp: , Rfl:  .  Copper Gluconate (COPPER CAPS) 2 MG CAPS, Take 2 mg by mouth daily., Disp: 60 capsule, Rfl: 3 .  EPINEPHrine 0.3 mg/0.3 mL IJ SOAJ injection, Inject 0.3 mLs (0.3 mg total) into the muscle once. Follow package instructions as needed for severe allergy or anaphylactic reaction. (Patient not taking: Reported on 04/28/2020), Disp: 1 Device, Rfl: 2  No results found.  No images are attached to the encounter.   CMP Latest Ref Rng & Units 04/28/2020  Glucose 70 - 99 mg/dL 76  BUN 6 - 20 mg/dL 11  Creatinine 0.61 - 1.24 mg/dL 1.01  Sodium 135 - 145 mmol/L 140  Potassium 3.5 - 5.1 mmol/L 4.2  Chloride 98 - 111 mmol/L 109  CO2 22 - 32 mmol/L 24  Calcium 8.9 - 10.3 mg/dL 8.6(L)  Total Protein 6.5 - 8.1 g/dL 6.9  Total Bilirubin 0.3 - 1.2 mg/dL 0.8  Alkaline Phos 38 - 126 U/L 85  AST 15 - 41 U/L 29  ALT 0 - 44 U/L 29   CBC Latest Ref Rng & Units 04/28/2020  WBC 4.0 - 10.5  K/uL 4.4  Hemoglobin 13.0 - 17.0 g/dL 11.5(L)  Hematocrit 39 - 52 % 34.9(L)  Platelets 150 - 400 K/uL 199     Assessment and plan:  patient is a 34 year old male referred for leukopenia and mild anemia  Patient has mild leukopenia with a white cell count that fluctuates between 3-4 with an [place] around 1.5.  He has had A stable hemoglobin of around 11 over the last 1 year.  Neutropenia has been intermittent and waxing and waning over the last 6 years.  Platelet count is normal.  Results of his anemia work-up were otherwise unremarkable except for a low copper level of 12.  Low copper levels can be seen in patients with gastric bypass and can be associated with both leukopenia and anemia.  I would therefore like him to take copper supplementation at 8 mg of elemental copper for the first week followed by 6 mg, 4 mg and then 2 mg and stay on 2 mg thereafter.  I will plan to see him back in 3 months time with repeat CBC with differential and copper levels.  Patient was noted to have a haptoglobin of less than 10.  However his reticulocyte count is normal with a total bilirubin that was normal.  I am therefore not suspecting an element of hemolysis here.  Follow-up instructions: As above  I discussed the assessment and treatment plan with the patient. The patient was provided an opportunity to ask questions and all were answered. The patient agreed with the plan and demonstrated an understanding of the instructions.   The patient was advised to call back or seek an in-person evaluation if the symptoms worsen or if the condition fails to improve as anticipated.   Visit Diagnosis: 1. Other neutropenia ([place])   2. Normocytic anemia     Dr. Randa Evens, MD, MPH [facility] at [facility] Tel- [phone] 05/12/2020 12:59 PM

## 2020-05-14 ENCOUNTER — Telehealth: Payer: Self-pay | Admitting: *Deleted

## 2020-05-14 NOTE — Telephone Encounter (Signed)
I called patient and let him know that I have called 6 pharmacies and none of them have the copper gluconate in stock. They do not even stock it on the shelves. It is available only on line. I have found it on [facility] website.  I went over the directions. Get 2 mg tablets and then directions- 4 tablets daily x 1 week, then 3 tablets daily x 1 week , then 2 tablets daily x 1 week and then 1 tablet daily until he comes back for his visit with Janese Banks. He understands and he wrote down the medication and the directions and understands how to take it verbally over the phone

## 2020-07-10 ENCOUNTER — Ambulatory Visit: Payer: No Typology Code available for payment source | Admitting: Internal Medicine

## 2020-07-23 ENCOUNTER — Other Ambulatory Visit: Payer: Self-pay | Admitting: Internal Medicine

## 2020-07-23 ENCOUNTER — Other Ambulatory Visit: Payer: Self-pay

## 2020-07-23 ENCOUNTER — Encounter: Payer: Self-pay | Admitting: Internal Medicine

## 2020-07-23 ENCOUNTER — Ambulatory Visit (INDEPENDENT_AMBULATORY_CARE_PROVIDER_SITE_OTHER): Payer: No Typology Code available for payment source | Admitting: Internal Medicine

## 2020-07-23 VITALS — BP 130/78 | HR 63 | Temp 98.3°F | Ht 71.0 in | Wt 188.1 lb

## 2020-07-23 DIAGNOSIS — E610 Copper deficiency: Secondary | ICD-10-CM

## 2020-07-23 DIAGNOSIS — E559 Vitamin D deficiency, unspecified: Secondary | ICD-10-CM

## 2020-07-23 DIAGNOSIS — D649 Anemia, unspecified: Secondary | ICD-10-CM

## 2020-07-23 DIAGNOSIS — J309 Allergic rhinitis, unspecified: Secondary | ICD-10-CM

## 2020-07-23 DIAGNOSIS — Z23 Encounter for immunization: Secondary | ICD-10-CM

## 2020-07-23 DIAGNOSIS — Z0000 Encounter for general adult medical examination without abnormal findings: Secondary | ICD-10-CM | POA: Diagnosis not present

## 2020-07-23 MED ORDER — FLUTICASONE PROPIONATE 50 MCG/ACT NA SUSP: 2.0000 | Freq: Every day | NASAL | 12 refills | Status: DC | PRN

## 2020-07-23 NOTE — Progress Notes (Signed)
Chief Complaint  Patient presents with  . Follow-up    No concerns.    Annual doing well  1. Allergies needs refills of flonase  2. Had covid 05/2020 around Xmas with 2 days of h/a and 24 hrs chills it took him 2 days to recover had 2/[facility] [place] but not booster  3. Tdap due  4. 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 He took copper taper and now is on copper 2 mg qd and he reports he feels less cold F/u H/o upcoming  Review of Systems  Constitutional: Negative for weight loss.  HENT: Negative for hearing loss.   Eyes: Negative for blurred vision.  Respiratory: Negative for shortness of breath.   Cardiovascular: Negative for chest pain.  Gastrointestinal: Positive for diarrhea. Negative for abdominal pain.  Musculoskeletal: Negative for falls and joint pain.  Skin: Negative for rash.  Neurological: Negative for headaches.  Psychiatric/Behavioral: Negative for depression.   Past Medical History:  Diagnosis Date  . Anemia, unspecified 02/22/2016   iron deficient  . Blood in stool   . COVID-19    05/2020 xmas  . Diarrhea   . Essential hypertension 12/30/2016  . Exocrine pancreatic insufficiency 10/15/2014  . Gallstones   . Gastro-esophageal reflux 12/30/2016  . Inguinal hernia   . Leukopenia   . Sleep apnea    hx of befor weight loss surgery none now  . Vasovagal syncope   . Vitamin D deficiency 12/30/2016   Past Surgical History:  Procedure Laterality Date  . 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 CHOLECYSTECTOMY N/A 01/04/2019   Procedure: LAPAROSCOPIC CHOLECYSTECTOMY with intraoperative cholangiogram;  Surgeon: Johnathan Hausen, MD;  Location: [facility];  Service: General;  Laterality: N/A;  . INGUINAL HERNIA REPAIR Right 01/04/2019   Procedure: OPEN RIGHT INGUINAL HERNIA REPAIR;  Surgeon: Johnathan Hausen, MD;  Location: [facility];  Service:  General;  Laterality: Right;   Family History  Problem Relation Age of Onset  . Pancreatic disease Father   . Diabetes Father   . Hypertension Father   . Diabetes Mother   . Hypertension Mother   . Hypertension Brother        ?  . Prostate cancer Neg Hx   . Kidney cancer Neg Hx   . Bladder Cancer Neg Hx    Social History   Socioeconomic History  . Marital status: Married    Spouse name: Not on file  . Number of children: Not on file  . Years of education: Not on file  . Highest education level: Not on file  Occupational History  . Not on file  Tobacco Use  . Smoking status: Never Smoker  . Smokeless tobacco: Never Used  Vaping Use  . Vaping Use: Some days  Substance and Sexual Activity  . Alcohol use: Not Currently    Comment: rarely  . Drug use: No  . Sexual activity: Yes  Other Topics Concern  . Not on file  Social History Narrative   Married    Works in [employer] OR patient is a 35 year old African-American male with a past medical history significant for sleeve gastrectomy with duodenal switch back in 2015.  Other medical problems include hypertension, GERD   No guns    Wears seat belt    Safe in relationship    [employer] [facility] and [employer] [facility]   Baby of 3 kids  Social Determinants of Health   Financial Resource Strain: Not on file  Food Insecurity: Not on file  Transportation Needs: Not on file  Physical Activity: Not on file  Stress: Not on file  Social Connections: Not on file  Intimate Partner Violence: Not on file   Current Meds  Medication Sig  . Cholecalciferol 1.25 MG (50000 UT) capsule Take 1 capsule (50,000 Units total) by mouth 2 (two) times a week.  . Copper Gluconate (COPPER CAPS) 2 MG CAPS Take 2 mg by mouth daily.  Marland Kitchen EPINEPHrine 0.3 mg/0.3 mL IJ SOAJ injection Inject 0.3 mLs (0.3 mg total) into the muscle once. Follow package instructions as needed for severe allergy or anaphylactic reaction.  . Multiple Vitamin (MULTI-VITAMINS) TABS Take 1 tablet  by mouth daily.   Marland Kitchen oxymetazoline (AFRIN NASAL SPRAY) 0.05 % nasal spray Place 1 spray into both nostrils 2 (two) times daily.  . polycarbophil (FIBERCON) 625 MG tablet Take 625 mg by mouth daily.  . [DISCONTINUED] fluticasone (FLONASE) 50 MCG/ACT nasal spray Place 2 sprays into both nostrils daily.   No Known Allergies Recent Results (from the past 2160 hour(s))  Technologist smear review     Status: None   Collection Time: 04/28/20  3:28 PM  Result Value Ref Range   WBC Morphology Normal RBC, WBC, and platelet    RBC Morphology Normal RBC, WBC, and platelet    Tech Review Normal RBC, WBC, and platelet     Comment: Performed at [facility], [address] [place],  [address]  Copper, serum     Status: Abnormal   Collection Time: 04/28/20  3:29 PM  Result Value Ref Range   Copper 12 (L) 69 - 132 ug/dL    Comment: (NOTE) **Verified by repeat analysis** This test was developed and its performance characteristics determined by [facility]. It has not been cleared or approved by the Food and Drug Administration.                                Detection Limit = 5 Performed At: [facility] [place], [place] [id number] Rush Farmer MD VQ:[id number]   Vitamin B6     Status: None   Collection Time: 04/28/20  3:29 PM  Result Value Ref Range   Vitamin B6 24.9 5.3 - 46.7 ug/L    Comment: (NOTE) This test was developed and its performance characteristics determined by [facility]. It has not been cleared or approved by the Food and Drug Administration. Performed At: [facility] [place], [place] [id number] Rush Farmer MD YK:[id number]   Vitamin B1     Status: None   Collection Time: 04/28/20  3:29 PM  Result Value Ref Range   Vitamin B1 (Thiamine) 113.4 66.5 - 200.0 nmol/L    Comment: (NOTE) This test was developed and its performance characteristics determined by [facility]. It has not been cleared or approved by the Food and  Drug Administration. Performed At: [facility] [place], [place] [id number] Rush Farmer MD [id number]   Hepatitis C antibody     Status: Abnormal   Collection Time: 04/28/20  3:29 PM  Result Value Ref Range   HCV Ab <0.1 (A) 0.0 - 0.9 s/co ratio    Comment: (NOTE)  Negative:     < 0.8                             Indeterminate: 0.8 - 0.9                                  Positive:     > 0.9 The CDC recommends that a positive HCV antibody result be followed up with a HCV Nucleic Acid Amplification test (295188). Performed At: [facility] [place], [place] [id number] Rush Farmer MD [id number]   HIV ANTIBODY (ROUTINE TETSING W RELFEX)     Status: None   Collection Time: 04/28/20  3:29 PM  Result Value Ref Range   HIV Screen 4th Generation wRfx Non Reactive Non Reactive    Comment: (NOTE) Performed At: [facility] [place], [place] [id number] Rush Farmer MD [id number]   TSH     Status: None   Collection Time: 04/28/20  3:29 PM  Result Value Ref Range   TSH 1.743 0.350 - 4.500 uIU/mL    Comment: Performed by a 3rd Generation assay with a functional sensitivity of <=0.01 uIU/mL. Performed at [facility], [address] [place], [place] [address]   Haptoglobin     Status: Abnormal   Collection Time: 04/28/20  3:29 PM  Result Value Ref Range   Haptoglobin <10 (L) 17 - 317 mg/dL    Comment: (NOTE) Performed At: [facility] [place], [place] [id number] Rush Farmer MD [id number]   Multiple Myeloma Panel (SPEP&IFE w/QIG)     Status: None   Collection Time: 04/28/20  3:29 PM  Result Value Ref Range   IgG (Immunoglobin G), Serum 1,349 603 - 1,613 mg/dL   IgA 238 90 - 386 mg/dL   IgM (Immunoglobulin M), Srm 166 20 - 172 mg/dL   Total Protein ELP 6.4 6.0 - 8.5 g/dL   Albumin SerPl Elph-Mcnc 3.7 2.9 - 4.4 g/dL   Alpha 1 0.2  0.0 - 0.4 g/dL   Alpha2 Glob SerPl Elph-Mcnc 0.4 0.4 - 1.0 g/dL   B-Globulin SerPl Elph-Mcnc 0.8 0.7 - 1.3 g/dL   Gamma Glob SerPl Elph-Mcnc 1.3 0.4 - 1.8 g/dL   M Protein SerPl Elph-Mcnc Not Observed Not Observed g/dL   Globulin, Total 2.7 2.2 - 3.9 g/dL   Albumin/Glob SerPl 1.4 0.7 - 1.7   IFE 1 Comment     Comment: (NOTE) The immunofixation pattern appears unremarkable. Evidence of monoclonal protein is not apparent.    Please Note Comment     Comment: (NOTE) Protein electrophoresis scan will follow via computer, mail, or courier delivery. Performed At: [facility] [place], [place] [id number] Rush Farmer MD [id number]   Reticulocytes     Status: Abnormal   Collection Time: 04/28/20  3:29 PM  Result Value Ref Range   Retic Ct Pct 1.4 0.4 - 3.1 %   RBC. 3.97 (L) 4.22 - 5.81 MIL/uL   Retic Count, Absolute 54.4 19.0 - 186.0 K/uL   Immature Retic Fract 6.8 2.3 - 15.9 %    Comment: Performed at [facility], [address], [place], [place] [address]  Folate     Status: None   Collection Time: 04/28/20  3:29 PM  Result Value Ref Range   Folate 13.5 >5.9 ng/mL    Comment: Performed at [facility], [address] [place], [place] [address]  Comprehensive metabolic panel     Status: Abnormal   Collection Time: 04/28/20  3:29 PM  Result Value Ref Range   Sodium 140 135 - 145 mmol/L   Potassium 4.2 3.5 - 5.1 mmol/L   Chloride 109 98 - 111 mmol/L   CO2 24 22 - 32 mmol/L   Glucose, Bld 76 70 - 99 mg/dL    Comment: Glucose reference range applies only to samples taken after fasting for at least 8 hours.   BUN 11 6 - 20 mg/dL   Creatinine, Ser 1.01 0.61 - 1.24 mg/dL   Calcium 8.6 (L) 8.9 - 10.3 mg/dL   Total Protein 6.9 6.5 - 8.1 g/dL   Albumin 4.0 3.5 - 5.0 g/dL   AST 29 15 - 41 U/L   ALT 29 0 - 44 U/L   Alkaline Phosphatase 85 38 - 126 U/L   Total Bilirubin 0.8 0.3 - 1.2 mg/dL   GFR, Estimated >60 >60 mL/min    Comment:  (NOTE) Calculated using the CKD-EPI Creatinine Equation (2021)    Anion gap 7 5 - 15    Comment: Performed at [facility], [address] [place], [place] [address]  CBC with Differential/Platelet     Status: Abnormal   Collection Time: 04/28/20  3:29 PM  Result Value Ref Range   WBC 4.4 4.0 - 10.5 K/uL   RBC 4.00 (L) 4.22 - 5.81 MIL/uL   Hemoglobin 11.5 (L) 13.0 - 17.0 g/dL   HCT 34.9 (L) 39.0 - 52.0 %   MCV 87.3 80.0 - 100.0 fL   MCH 28.8 26.0 - 34.0 pg   MCHC 33.0 30.0 - 36.0 g/dL   RDW 13.3 11.5 - 15.5 %   Platelets 199 150 - 400 K/uL   nRBC 0.0 0.0 - 0.2 %   Neutrophils Relative % 33 %   Neutro Abs 1.5 (L) 1.7 - 7.7 K/uL   Lymphocytes Relative 59 %   Lymphs Abs 2.6 0.7 - 4.0 K/uL   Monocytes Relative 6 %   Monocytes Absolute 0.3 0.1 - 1.0 K/uL   Eosinophils Relative 1 %   Eosinophils Absolute 0.0 0.0 - 0.5 K/uL   Basophils Relative 1 %   Basophils Absolute 0.0 0.0 - 0.1 K/uL   Immature Granulocytes 0 %   Abs Immature Granulocytes 0.01 0.00 - 0.07 K/uL    Comment: Performed at [facility], [address] [place], [place] [address]   Objective  Body mass index is 26.24 kg/m. Wt Readings from Last 3 Encounters:  07/23/20 188 lb 2 oz (85.3 kg)  04/28/20 189 lb 4.8 oz (85.9 kg)  01/08/20 183 lb 6.4 oz (83.2 kg)   Temp Readings from Last 3 Encounters:  07/23/20 98.3 F (36.8 C)  04/28/20 99.5 F (37.5 C) (Tympanic)  01/08/20 98.1 F (36.7 C) (Oral)   BP Readings from Last 3 Encounters:  07/23/20 130/78  04/28/20 114/62  01/08/20 122/70   Pulse Readings from Last 3 Encounters:  07/23/20 63  04/28/20 75  01/08/20 80    Physical Exam Vitals and nursing note reviewed.  Constitutional:      Appearance: Normal appearance. He is well-developed, well-groomed and overweight.  HENT:     Head: Normocephalic and atraumatic.  Eyes:     Conjunctiva/sclera: Conjunctivae normal.     Pupils: Pupils are equal, round, and reactive to light.   Cardiovascular:     Rate and Rhythm: Normal  rate and regular rhythm.     Heart sounds: Normal heart sounds. No murmur heard.   Pulmonary:     Effort: Pulmonary effort is normal.     Breath sounds: Normal breath sounds.  Abdominal:     Tenderness: There is no abdominal tenderness.    Skin:    General: Skin is warm and dry.  Neurological:     General: No focal deficit present.     Mental Status: He is alert and oriented to person, place, and time. Mental status is at baseline.     Gait: Gait normal.  Psychiatric:        Attention and Perception: Attention and perception normal.        Mood and Affect: Mood and affect normal.        Speech: Speech normal.        Behavior: Behavior normal. Behavior is cooperative.        Thought Content: Thought content normal.        Cognition and Memory: Cognition and memory normal.        Judgment: Judgment normal.     Assessment  Plan  Annual physical exam Flu shot utd at work fall [employer] [facility] 2/2 consider booster [facility] [place]  Tdap in future Consider hep B in future not immune declined prev  MMR immune  Hep C neg 09/19/17  HIV neg 02/14/17 EGD 05/16/13 normal  Colonoscopy age 41  rec healthy diet and exercise   GI Dr. Lorie Apley Dr.K  Surgery Dr. Hassell Done for hemorrhoids   Allergic rhinitis, unspecified seasonality, unspecified trigger - Plan: fluticasone (FLONASE) 50 MCG/ACT nasal spray  Copper deficiency On copper 2 mg qd   Anemia could be due to prior gastric bypass (iron def) and copper def. And leukopenia resolved    Provider: Dr. Olivia Mackie McLean-Scocuzza-[facility] Medicine

## 2020-07-23 NOTE — Patient Instructions (Addendum)
dont forget to ask  Dermatology about the dilated pores on your abdomen or cyst there too   covid booster    IF patient covid  Allegra, claritin, zyrtec, xyzal allergy medication over the counter sneezng, coughing, runny nose  There is no medication other than over the counter meds:  Mucinex dm green label for cough.  Vitamin C 1000 mg daily.  Vitamin D3 4000 Iu (units) daily.  Zinc 100 mg daily.  Quercetin 250-500 mg 2 times per day   Elderberry  Oil of oregano  cepacol or chloroseptic spray  Warm tea with honey and lemon  Hydration  Try to eat though you dont feel like it   Tylenol or Advil  Nasal saline  Flonase  Monitor pulse oximeter, buy from [place] if oxygen is less than 90 please go to the hospital.        Are you feeling really sick? Shortness of breath, cough, chest pain?, dizziness? Confusion   If so let me know  If worsening, go to hospital or [facility] clinic Urgent care for further treatment   Tdap (Tetanus, Diphtheria, Pertussis) Vaccine: What You Need to Know 1. Why get vaccinated? Tdap vaccine can prevent tetanus, diphtheria, and pertussis. Diphtheria and pertussis spread from person to person. Tetanus enters the body through cuts or wounds.  TETANUS (T) causes painful stiffening of the muscles. Tetanus can lead to serious health problems, including being unable to open the mouth, having trouble swallowing and breathing, or death.  DIPHTHERIA (D) can lead to difficulty breathing, heart failure, paralysis, or death.  PERTUSSIS (aP), also known as "whooping cough," can cause uncontrollable, violent coughing that makes it hard to breathe, eat, or drink. Pertussis can be extremely serious especially in babies and young children, causing pneumonia, convulsions, brain damage, or death. In teens and adults, it can cause weight loss, loss of bladder control, passing out, and rib fractures from severe coughing. 2. Tdap vaccine Tdap is only for children 7 years and  older, adolescents, and adults.  Adolescents should receive a single dose of Tdap, preferably at age 55 or 67 years. Pregnant people should get a dose of Tdap during every pregnancy, preferably during the early part of the third trimester, to help protect the newborn from pertussis. Infants are most at risk for severe, life-threatening complications from pertussis. Adults who have never received Tdap should get a dose of Tdap. Also, adults should receive a booster dose of either Tdap or Td (a different vaccine that protects against tetanus and diphtheria but not pertussis) every 10 years, or after 5 years in the case of a severe or dirty wound or burn. Tdap may be given at the same time as other vaccines. 3. Talk with your health care provider Tell your vaccine provider if the person getting the vaccine:  Has had an allergic reaction after a previous dose of any vaccine that protects against tetanus, diphtheria, or pertussis, or has any severe, life-threatening allergies  Has had a coma, decreased level of consciousness, or prolonged seizures within 7 days after a previous dose of any pertussis vaccine (DTP, DTaP, or Tdap)  Has seizures or another nervous system problem  Has ever had Guillain-Barr Syndrome (also called "GBS")  Has had severe pain or swelling after a previous dose of any vaccine that protects against tetanus or diphtheria In some cases, your health care provider may decide to postpone Tdap vaccination until a future visit. People with minor illnesses, such as a cold, may be vaccinated. People  who are moderately or severely ill should usually wait until they recover before getting Tdap vaccine.  Your health care provider can give you more information. 4. Risks of a vaccine reaction  Pain, redness, or swelling where the shot was given, mild fever, headache, feeling tired, and nausea, vomiting, diarrhea, or stomachache sometimes happen after Tdap vaccination. People sometimes  faint after medical procedures, including vaccination. Tell your provider if you feel dizzy or have vision changes or ringing in the ears.  As with any medicine, there is a very remote chance of a vaccine causing a severe allergic reaction, other serious injury, or death. 5. What if there is a serious problem? An allergic reaction could occur after the vaccinated person leaves the clinic. If you see signs of a severe allergic reaction (hives, swelling of the face and throat, difficulty breathing, a fast heartbeat, dizziness, or weakness), call 9-1-1 and get the person to the nearest hospital. For other signs that concern you, call your health care provider.  Adverse reactions should be reported to the Vaccine Adverse Event Reporting System (VAERS). Your health care provider will usually file this report, or you can do it yourself. Visit the VAERS website at www.vaers.SamedayNews.es or call 838-163-9243. VAERS is only for reporting reactions, and VAERS staff members do not give medical advice. 6. The National Vaccine Injury Compensation Program The [employer] Vaccine Injury Compensation Program (VICP) is a federal program that was created to compensate people who may have been injured by certain vaccines. Claims regarding alleged injury or death due to vaccination have a time limit for filing, which may be as short as two years. Visit the VICP website at GoldCloset.com.ee or call 707-449-0252 to learn about the program and about filing a claim. 7. How can I learn more?  Ask your health care provider.  Call your local or state health department.  Visit the website of the Food and Drug Administration (FDA) for vaccine package inserts and additional information at TraderRating.uy.  Contact the Centers for Disease Control and Prevention (CDC): ? Call 204-720-3592 (1-800-CDC-INFO) or ? Visit CDC's website at http://hunter.com/. Vaccine Information Statement  Tdap (Tetanus, Diphtheria, Pertussis) Vaccine (01/17/2020) This information is not intended to replace advice given to you by your health care provider. Make sure you discuss any questions you have with your health care provider. Document Revised: 02/12/2020 Document Reviewed: 02/12/2020 Elsevier Patient Education  2021 [employer].

## 2020-07-24 ENCOUNTER — Other Ambulatory Visit: Payer: Self-pay | Admitting: *Deleted

## 2020-07-24 DIAGNOSIS — E559 Vitamin D deficiency, unspecified: Secondary | ICD-10-CM

## 2020-08-10 ENCOUNTER — Inpatient Hospital Stay: Payer: No Typology Code available for payment source | Attending: Oncology | Admitting: Oncology

## 2020-08-10 ENCOUNTER — Encounter: Payer: Self-pay | Admitting: Oncology

## 2020-08-10 ENCOUNTER — Inpatient Hospital Stay: Payer: No Typology Code available for payment source

## 2020-08-10 VITALS — BP 115/71 | HR 79 | Temp 98.6°F | Resp 16 | Ht 71.0 in | Wt 185.8 lb

## 2020-08-10 DIAGNOSIS — D708 Other neutropenia: Secondary | ICD-10-CM

## 2020-08-10 DIAGNOSIS — D709 Neutropenia, unspecified: Secondary | ICD-10-CM | POA: Diagnosis not present

## 2020-08-10 DIAGNOSIS — E610 Copper deficiency: Secondary | ICD-10-CM | POA: Diagnosis not present

## 2020-08-10 DIAGNOSIS — I10 Essential (primary) hypertension: Secondary | ICD-10-CM | POA: Diagnosis not present

## 2020-08-10 DIAGNOSIS — D649 Anemia, unspecified: Secondary | ICD-10-CM | POA: Diagnosis not present

## 2020-08-10 DIAGNOSIS — G4730 Sleep apnea, unspecified: Secondary | ICD-10-CM | POA: Diagnosis not present

## 2020-08-10 DIAGNOSIS — E559 Vitamin D deficiency, unspecified: Secondary | ICD-10-CM

## 2020-08-10 DIAGNOSIS — K219 Gastro-esophageal reflux disease without esophagitis: Secondary | ICD-10-CM | POA: Diagnosis not present

## 2020-08-10 LAB — CBC WITH DIFFERENTIAL/PLATELET
Abs Immature Granulocytes: 0 10*3/uL (ref 0.00–0.07)
Basophils Absolute: 0.1 10*3/uL (ref 0.0–0.1)
Basophils Relative: 1 %
Eosinophils Absolute: 0 10*3/uL (ref 0.0–0.5)
Eosinophils Relative: 1 %
HCT: 38.1 % — ABNORMAL LOW (ref 39.0–52.0)
Hemoglobin: 12.3 g/dL — ABNORMAL LOW (ref 13.0–17.0)
Immature Granulocytes: 0 %
Lymphocytes Relative: 54 %
Lymphs Abs: 3 10*3/uL (ref 0.7–4.0)
MCH: 28.7 pg (ref 26.0–34.0)
MCHC: 32.3 g/dL (ref 30.0–36.0)
MCV: 89 fL (ref 80.0–100.0)
Monocytes Absolute: 0.3 10*3/uL (ref 0.1–1.0)
Monocytes Relative: 5 %
Neutro Abs: 2.2 10*3/uL (ref 1.7–7.7)
Neutrophils Relative %: 39 %
Platelets: 205 10*3/uL (ref 150–400)
RBC: 4.28 MIL/uL (ref 4.22–5.81)
RDW: 14.1 % (ref 11.5–15.5)
WBC: 5.6 10*3/uL (ref 4.0–10.5)
nRBC: 0 % (ref 0.0–0.2)

## 2020-08-10 LAB — FERRITIN: Ferritin: 21 ng/mL — ABNORMAL LOW (ref 24–336)

## 2020-08-10 LAB — IRON AND TIBC
Iron: 114 ug/dL (ref 45–182)
Saturation Ratios: 26 % (ref 17.9–39.5)
TIBC: 441 ug/dL (ref 250–450)
UIBC: 327 ug/dL

## 2020-08-10 LAB — VITAMIN D 25 HYDROXY (VIT D DEFICIENCY, FRACTURES): Vit D, 25-Hydroxy: 31.1 ng/mL (ref 30–100)

## 2020-08-10 NOTE — Progress Notes (Signed)
Pt  States that he has not had any infections or concerns. No concerns from his standpoint. No pain. Eating well, BM good.

## 2020-08-10 NOTE — Progress Notes (Signed)
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 Other medical problems include hypertension and GERD. He has been referred to [place] for neutropenia. Most recent CBC from 04/13/2020 showed white count of 3.7, H&H of 11.2/34.2 with a platelet count of 169. Differential showed mild neutropenia with an ANC of 1.3 with relative lymphocytosis but absolute lymphocyte count was normal. Looking back at his prior CBCs his white count does wax and wane between 2.8-14 and he has had mild intermittent neutropenia in the past. He denies any recurrent infections or hospitalizations. Denies any unintentional weight loss. Denies any recent change in his medications.Patient has also had mild chronic anemia and his hemoglobin typically runs around 11.  Results of blood work from 04/28/2020 were as follows: CBC showed white count of 4.4, H&H of 11.5/34.9 with an MCV of 87 and a platelet count of 199.  ANC was 1.5.  CMP, folate, reticulocyte count, TSH was within normal limits.  HIV and hepatitis C were negative.  B1 B6 was normal.  Myeloma panel showed no M protein.  Haptoglobin was less than 10.  Smear review was unremarkable.  Copper levels were low at 12.  Interval history-patient reports doing well.  He is getting over-the-counter copper supplements from [employer].  Denies any specific  complaints at this time  ECOG PS- 0 Pain scale- 0   Review of systems- Review of Systems  Constitutional: Negative for chills, fever, malaise/fatigue and weight loss.  HENT: Negative for congestion, ear discharge and nosebleeds.   Eyes: Negative for blurred vision.  Respiratory: Negative for cough, hemoptysis, sputum production, shortness of breath and wheezing.   Cardiovascular: Negative for chest pain, palpitations, orthopnea and claudication.  Gastrointestinal: Negative for abdominal pain, blood in stool, constipation, diarrhea, heartburn, melena, nausea and vomiting.  Genitourinary: Negative for dysuria, flank pain, frequency, hematuria and urgency.  Musculoskeletal: Negative for back pain, joint pain and myalgias.  Skin: Negative for rash.  Neurological: Negative for dizziness, tingling, focal weakness, seizures, weakness and headaches.  Endo/Heme/Allergies: Does not bruise/bleed easily.  Psychiatric/Behavioral: Negative for depression and suicidal ideas. The patient does not have insomnia.       No Known Allergies   Past Medical History:  Diagnosis Date  . Anemia, unspecified 02/22/2016   iron deficient  . Blood in stool   . COVID-19    05/2020 xmas  . Diarrhea   . Essential hypertension 12/30/2016  . Exocrine pancreatic insufficiency 10/15/2014  . Gallstones   . Gastro-esophageal reflux 12/30/2016  . Inguinal hernia   . Leukopenia   . Sleep apnea    hx of befor weight loss surgery none now  . Vasovagal syncope   . Vitamin D deficiency 12/30/2016     Past Surgical History:  Procedure Laterality Date  . 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 CHOLECYSTECTOMY N/A 01/04/2019   Procedure: LAPAROSCOPIC CHOLECYSTECTOMY with intraoperative cholangiogram;  Surgeon: Johnathan Hausen, MD;  Location: [facility];  Service: General;  Laterality: N/A;  . INGUINAL HERNIA REPAIR Right 01/04/2019   Procedure: OPEN RIGHT INGUINAL HERNIA REPAIR;  Surgeon:  Johnathan Hausen, MD;  Location: [facility];  Service: General;  Laterality: Right;    Social History   Socioeconomic History  . Marital status: Married    Spouse name: Not on file  . Number of children: Not on file  . Years of education: Not on file  . Highest education level: Not on file  Occupational History  . Not on file  Tobacco Use  . Smoking status: Never Smoker  . Smokeless tobacco: Never Used  Vaping Use  . Vaping Use: Never used  Substance and Sexual Activity  . Alcohol use: Yes    Comment: rarely  . Drug use: No  . Sexual activity: Yes  Other Topics Concern  . Not on file  Social History Narrative   Married    Works in [employer] OR patient is a 35 year old African-American male with a past medical history significant for sleeve gastrectomy with duodenal switch back in 2015.  Other medical problems include hypertension, GERD   No guns    Wears seat belt    Safe in relationship    [employer] [facility] and [employer] [facility]   Baby of 3 kids   Social Determinants of Health   Financial Resource Strain: Not on file  Food Insecurity: Not on file  Transportation Needs: Not on file  Physical Activity: Not on file  Stress: Not on file  Social Connections: Not on file  Intimate Partner Violence: Not on file    Family History  Problem Relation Age of Onset  . Pancreatic disease Father   . Diabetes Father   . Hypertension Father   . Diabetes Mother   . Hypertension Mother   . Hypertension Brother        ?  . Prostate cancer Neg Hx   . Kidney cancer Neg Hx   . Bladder Cancer Neg Hx      Current Outpatient Medications:  .  Cholecalciferol 1.25 MG (50000 UT) capsule, Take 1 capsule (50,000 Units total) by mouth 2 (two) times a week., Disp: 18 capsule, Rfl: 2 .  Copper Gluconate (COPPER CAPS) 2 MG CAPS, Take 2 mg by mouth daily., Disp: 60 capsule, Rfl: 3 .  fluticasone (FLONASE) 50 MCG/ACT nasal spray, Place 2 sprays into both nostrils daily as needed for allergies or rhinitis., Disp:  16 g, Rfl: 12 .  Multiple Vitamin (MULTI-VITAMINS) TABS, Take 1 tablet by mouth daily. , Disp: , Rfl:  .  polycarbophil (FIBERCON) 625 MG tablet, Take 625 mg by mouth daily., Disp: , Rfl:  .  EPINEPHrine 0.3 mg/0.3 mL IJ SOAJ injection, Inject 0.3 mLs (0.3 mg total) into the muscle once. Follow package instructions as needed for severe allergy or anaphylactic reaction. (Patient not taking: Reported on 08/10/2020), Disp: 1 Device, Rfl: 2 .  oxymetazoline (AFRIN NASAL SPRAY) 0.05 % nasal spray, Place 1 spray into both nostrils 2 (two) times daily. (Patient taking differently: Place 1 spray into both nostrils 2 (two) times daily as needed.), Disp: 30 mL, Rfl: 0  Physical exam:  Vitals:   08/10/20 1439  BP: 115/71  Pulse: 79  Resp: 16  Temp: 98.6 F (37 C)  TempSrc: Oral  Weight: 185 lb 12.8 oz (84.3 kg)  Height: 5\' 11"  (1.803 m)   Physical Exam Constitutional:      General: He is not in acute distress. Eyes:  Extraocular Movements: EOM normal.  Pulmonary:     Effort: Pulmonary effort is normal.  Skin:    General: Skin is warm and dry.  Neurological:     Mental Status: He is alert and oriented to person, place, and time.      CMP Latest Ref Rng & Units 04/28/2020  Glucose 70 - 99 mg/dL 76  BUN 6 - 20 mg/dL 11  Creatinine 0.61 - 1.24 mg/dL 1.01  Sodium 135 - 145 mmol/L 140  Potassium 3.5 - 5.1 mmol/L 4.2  Chloride 98 - 111 mmol/L 109  CO2 22 - 32 mmol/L 24  Calcium 8.9 - 10.3 mg/dL 8.6(L)  Total Protein 6.5 - 8.1 g/dL 6.9  Total Bilirubin 0.3 - 1.2 mg/dL 0.8  Alkaline Phos 38 - 126 U/L 85  AST 15 - 41 U/L 29  ALT 0 - 44 U/L 29   CBC Latest Ref Rng & Units 08/10/2020  WBC 4.0 - 10.5 K/uL 5.6  Hemoglobin 13.0 - 17.0 g/dL 12.3(L)  Hematocrit 39.0 - 52.0 % 38.1(L)  Platelets 150 - 400 K/uL 205      Assessment and plan- Patient is a 35 y.o. male here for routine follow-up of neutropenia and anemia  Neutropenia:This has been chronic and waxing and waning with an Bowie  that fluctuates between 1.3-2.5.  This has been ongoing at least since 2015.  It could be possibly benign ethnic neutropenia versus contributory to some extent because of copper deficiency.  Today his white count neutrophils are normal.  Continue to monitor  Normocytic anemia: Hemoglobin mildly improved to 12.3 after receiving copper supplements.  Levels from today are pending.  I will see him back in 6 months with CBC ferritin and iron studies B12 and copper levels   Visit Diagnosis 1. Other neutropenia ([place])   2. Vitamin D deficiency   3. Normocytic anemia   4. Copper deficiency      Dr. Randa Evens, MD, MPH [facility] at [facility] [phone] 08/10/2020 4:41 PM

## 2020-08-12 LAB — COPPER, SERUM: Copper: 83 ug/dL (ref 69–132)

## 2020-09-23 ENCOUNTER — Other Ambulatory Visit: Payer: Self-pay

## 2020-09-23 ENCOUNTER — Ambulatory Visit: Payer: No Typology Code available for payment source | Admitting: Dermatology

## 2020-09-23 DIAGNOSIS — L720 Epidermal cyst: Secondary | ICD-10-CM | POA: Diagnosis not present

## 2020-09-23 NOTE — Patient Instructions (Signed)
Pre-op information given.  Recommend patient stop taking blood thinner medication (i.e. aspirin) 14 days prior to procedure, or as recommended by PCP/cardiologist.

## 2020-09-23 NOTE — Progress Notes (Signed)
   New Patient Visit  Subjective  Thomas Shaw is a 35 y.o. male who presents for the following: Other (Knot on scalp x ~1 year and a bump on his chest that drains if squeezed).  The following portions of the chart were reviewed this encounter and updated as appropriate:   Tobacco  Allergies  Meds  Problems  Med Hx  Surg Hx  Fam Hx     Review of Systems:  No other skin or systemic complaints except as noted in HPI or Assessment and Plan.  Objective  Well appearing patient in no apparent distress; mood and affect are within normal limits.  A focused examination was performed including scalp, chest. Relevant physical exam findings are noted in the Assessment and Plan.  Objective  Right crown scalp, Right xyphoid below surgery scar: 1.2 cm subcutaneous nodule of right xyphoid below surgery scar. 1.0 cm subcutaneous nodule of right crown scalp.  Images     Assessment & Plan  Epidermal inclusion cyst (2) Right xyphoid below surgery scar; Right crown scalp  Discussed excision. Advised patient he would need to schedule 2 separate appointments. Patient will schedule surgery appointments. Fee estimate given today and photo of estimate is media.  Return for Surgery - 2 seperate appointments, 1 week apart.Doylene Bode, CMA, am acting as scribe for Sarina Ser, MD .  Documentation: I have reviewed the above documentation for accuracy and completeness, and I agree with the above.  Sarina Ser, MD

## 2020-09-24 ENCOUNTER — Encounter: Payer: Self-pay | Admitting: Dermatology

## 2020-09-28 ENCOUNTER — Ambulatory Visit: Payer: No Typology Code available for payment source | Admitting: Dermatology

## 2020-10-14 ENCOUNTER — Other Ambulatory Visit: Payer: Self-pay

## 2020-10-14 MED ORDER — TRIAMTERENE-HCTZ 37.5-25 MG PO TABS
ORAL_TABLET | ORAL | 2 refills | Status: DC
  Filled 2020-10-14: qty 90, 90d supply, fill #0

## 2020-10-14 MED FILL — Cholecalciferol Cap 1.25 MG (50000 Unit): ORAL | 60 days supply | Qty: 18 | Fill #0 | Status: AC

## 2020-10-16 ENCOUNTER — Other Ambulatory Visit: Payer: Self-pay

## 2020-10-16 MED FILL — Fluticasone Propionate Nasal Susp 50 MCG/ACT: NASAL | 30 days supply | Qty: 16 | Fill #0 | Status: AC

## 2020-10-27 ENCOUNTER — Ambulatory Visit: Payer: No Typology Code available for payment source | Admitting: Dermatology

## 2020-10-27 ENCOUNTER — Other Ambulatory Visit: Payer: Self-pay

## 2020-10-27 DIAGNOSIS — L7211 Pilar cyst: Secondary | ICD-10-CM | POA: Diagnosis not present

## 2020-10-27 DIAGNOSIS — D492 Neoplasm of unspecified behavior of bone, soft tissue, and skin: Secondary | ICD-10-CM

## 2020-10-27 MED ORDER — MUPIROCIN 2 % EX OINT
1.0000 "application " | TOPICAL_OINTMENT | Freq: Every day | CUTANEOUS | 1 refills | Status: DC
  Filled 2020-10-27: qty 22, 20d supply, fill #0

## 2020-10-27 NOTE — Patient Instructions (Signed)
If you have any questions or concerns for your doctor, please call our main line at [phone] and press option 4 to reach your doctor's medical assistant. If no one answers, please leave a voicemail as directed and we will return your call as soon as possible. Messages left after 4 pm will be answered the following business day.   You may also send us a message via MyChart. We typically respond to MyChart messages within 1-2 business days.  For prescription refills, please ask your pharmacy to contact our office. Our fax number is [phone].  If you have an urgent issue when the clinic is closed that cannot wait until the next business day, you can page your doctor at the number below.    Please note that while we do our best to be available for urgent issues outside of office hours, we are not available 24/7.   If you have an urgent issue and are unable to reach us, you may choose to seek medical care at your doctor's office, retail clinic, urgent care center, or emergency room.  If you have a medical emergency, please immediately call 911 or go to the emergency department.  Pager Numbers  - Dr. Kowalski: [phone]  - Dr. Moye: [phone]  - Dr. Stewart: [phone]  In the event of inclement weather, please call our main line at [phone] for an update on the status of any delays or closures.  Dermatology Medication Tips: Please keep the boxes that topical medications come in in order to help keep track of the instructions about where and how to use these. Pharmacies typically print the medication instructions only on the boxes and not directly on the medication tubes.   If your medication is too expensive, please contact our office at [phone] option 4 or send us a message through MyChart.   We are unable to tell what your co-pay for medications will be in advance as this is different depending on your insurance coverage. However, we may be able to find a  substitute medication at lower cost or fill out paperwork to get insurance to cover a needed medication.   If a prior authorization is required to get your medication covered by your insurance company, please allow us 1-2 business days to complete this process.  Drug prices often vary depending on where the prescription is filled and some pharmacies may offer cheaper prices.  The website www.goodrx.com contains coupons for medications through different pharmacies. The prices here do not account for what the cost may be with help from insurance (it may be cheaper with your insurance), but the website can give you the price if you did not use any insurance.  - You can print the associated coupon and take it with your prescription to the pharmacy.  - You may also stop by our office during regular business hours and pick up a GoodRx coupon card.  - If you need your prescription sent electronically to a different pharmacy, notify our office through [facility] MyChart or by phone at [phone] option 4.     Wound Care Instructions  1. Cleanse wound gently with soap and water once a day then pat dry with clean gauze. Apply a thing coat of Petrolatum (petroleum jelly, "Vaseline") over the wound (unless you have an allergy to this). We recommend that you use a new, sterile tube of Vaseline. Do not pick or remove scabs. Do not remove the yellow or white "healing tissue" from the base of the wound.    2. Cover the wound with fresh, clean, nonstick gauze and secure with paper tape. You may use Band-Aids in place of gauze and tape if the would is small enough, but would recommend trimming much of the tape off as there is often too much. Sometimes Band-Aids can irritate the skin.  3. You should call the office for your biopsy report after 1 week if you have not already been contacted.  4. If you experience any problems, such as abnormal amounts of bleeding, swelling, significant bruising, significant pain,  or evidence of infection, please call the office immediately.  5. FOR ADULT SURGERY PATIENTS: If you need something for pain relief you may take 1 extra strength Tylenol (acetaminophen) AND 2 Ibuprofen (200mg each) together every 4 hours as needed for pain. (do not take these if you are allergic to them or if you have a reason you should not take them.) Typically, you may only need pain medication for 1 to 3 days.     

## 2020-10-27 NOTE — Progress Notes (Signed)
   Follow-Up Visit   Subjective  Thomas Shaw is a 35 y.o. male who presents for the following: Cyst.  The following portions of the chart were reviewed this encounter and updated as appropriate:   Tobacco  Allergies  Meds  Problems  Med Hx  Surg Hx  Fam Hx     Review of Systems:  No other skin or systemic complaints except as noted in HPI or Assessment and Plan.  Objective  Well appearing patient in no apparent distress; mood and affect are within normal limits.  A focused examination was performed including Scalp. Relevant physical exam findings are noted in the Assessment and Plan.  Objective  R crown scalp: Cystic pap 2.2cm   Assessment & Plan  Neoplasm of skin R crown scalp  Skin excision  Lesion length (cm):  2.2 Lesion width (cm):  2.2 Margin per side (cm):  0 Total excision diameter (cm):  2.2 Informed consent: discussed and consent obtained   Timeout: patient name, date of birth, surgical site, and procedure verified   Procedure prep:  Patient was prepped and draped in usual sterile fashion Prep type:  Isopropyl alcohol and povidone-iodine Anesthesia: the lesion was anesthetized in a standard fashion   Anesthetic:  1% lidocaine w/ epinephrine 1-100,000 buffered w/ 8.4% NaHCO3 (3cc) Instrument used: #15 blade   Hemostasis achieved with: pressure   Hemostasis achieved with comment:  Electrocautery Outcome: patient tolerated procedure well with no complications   Post-procedure details: sterile dressing applied and wound care instructions given   Dressing type: bandage and pressure dressing (Mupirocin)    Skin repair Complexity:  Complex Final length (cm):  2.5 Reason for type of repair: reduce tension to allow closure, reduce the risk of dehiscence, infection, and necrosis, reduce subcutaneous dead space and avoid a hematoma, allow closure of the large defect, preserve normal anatomy, preserve normal anatomical and functional relationships and enhance  both functionality and cosmetic results   Undermining: area extensively undermined   Undermining comment:  Undermining Defect 2.2cm Subcutaneous layers (deep stitches):  Suture size:  4-0 Suture type: Vicryl (polyglactin 910)   Subcutaneous suture technique: Inverted Dermal. Fine/surface layer approximation (top stitches):  Suture size:  3-0 Suture type: nylon   Stitches: horizontal mattress   Suture removal (days):  7 Hemostasis achieved with: pressure Outcome: patient tolerated procedure well with no complications   Post-procedure details: sterile dressing applied and wound care instructions given   Dressing type: bandage, pressure dressing and bacitracin (Mupirocin)    mupirocin ointment (BACTROBAN) 2 %  Specimen 1 - Surgical pathology Differential Diagnosis: D48.5 Cyst vs other  Check Margins: No Cystic pap 2.2cm  Cyst vs other  Start Mupirocin oint qd to excision site  Return in about 1 week (around 11/03/2020) for suture removal and cyst excision.  I, Othelia Pulling, RMA, am acting as scribe for Sarina Ser, MD .  Documentation: I have reviewed the above documentation for accuracy and completeness, and I agree with the above.  Sarina Ser, MD

## 2020-11-03 ENCOUNTER — Ambulatory Visit: Payer: No Typology Code available for payment source | Admitting: Dermatology

## 2020-11-03 ENCOUNTER — Other Ambulatory Visit: Payer: Self-pay

## 2020-11-03 DIAGNOSIS — L720 Epidermal cyst: Secondary | ICD-10-CM | POA: Diagnosis not present

## 2020-11-03 DIAGNOSIS — L7211 Pilar cyst: Secondary | ICD-10-CM

## 2020-11-03 DIAGNOSIS — D485 Neoplasm of uncertain behavior of skin: Secondary | ICD-10-CM

## 2020-11-03 NOTE — Progress Notes (Signed)
Follow-Up Visit   Subjective  Thomas Shaw is a 35 y.o. male who presents for the following: cyst vs other (Right xyphoid below surgery scar, pt presents for excision) and Pilar cyst bx proven (R crown scalp, pt presents for suture removal).  The following portions of the chart were reviewed this encounter and updated as appropriate:   Tobacco  Allergies  Meds  Problems  Med Hx  Surg Hx  Fam Hx     Review of Systems:  No other skin or systemic complaints except as noted in HPI or Assessment and Plan.  Objective  Well appearing patient in no apparent distress; mood and affect are within normal limits.  A focused examination was performed including scalp, chest. Relevant physical exam findings are noted in the Assessment and Plan.  Objective  Right xyphoid below surgery scar: 1.2 cm cystic papule  Objective  R crown scalp: Healing excision site   Assessment & Plan  Neoplasm of uncertain behavior of skin Right xyphoid below surgery scar  Skin excision  Lesion length (cm):  1.2 Lesion width (cm):  1.2 Margin per side (cm):  0 Total excision diameter (cm):  1.2 Informed consent: discussed and consent obtained   Timeout: patient name, date of birth, surgical site, and procedure verified   Procedure prep:  Patient was prepped and draped in usual sterile fashion Prep type:  Isopropyl alcohol and povidone-iodine Anesthesia: the lesion was anesthetized in a standard fashion   Anesthetic:  1% lidocaine w/ epinephrine 1-100,000 buffered w/ 8.4% NaHCO3 Instrument used: #15 blade   Hemostasis achieved with: pressure   Hemostasis achieved with comment:  Electrocautery Outcome: patient tolerated procedure well with no complications   Post-procedure details: sterile dressing applied and wound care instructions given   Dressing type: bandage and pressure dressing (mupirocin)    Skin repair Complexity:  Complex Final length (cm):  2.7 Reason for type of repair: reduce  tension to allow closure, reduce the risk of dehiscence, infection, and necrosis, reduce subcutaneous dead space and avoid a hematoma, allow closure of the large defect, preserve normal anatomy, preserve normal anatomical and functional relationships and enhance both functionality and cosmetic results   Undermining: area extensively undermined   Undermining comment:  Undermining defect 1.2 cm Subcutaneous layers (deep stitches):  Suture size:  3-0 Suture type: Vicryl (polyglactin 910)   Subcutaneous suture technique: inverted dermal. 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  - Discussed pathology results showing Pilar cyst.  - Patient advised to keep steri-strips dry until they fall off. - Scars remodel for a full year. - Once steri-strips fall off, patient can apply over-the-counter silicone scar cream each night to help with scar remodeling if desired. - Patient advised to call with any concerns or if they notice any new or  changing lesions.   Return in about 1 week (around 11/10/2020) for suture removal.  I, Ashok Cordia, CMA, am acting as scribe for Sarina Ser, MD .  Documentation: I have reviewed the above documentation for accuracy and  completeness, and I agree with the above.  Sarina Ser, MD

## 2020-11-03 NOTE — Patient Instructions (Signed)
If you have any questions or concerns for your doctor, please call our main line at [phone] and press option 4 to reach your doctor's medical assistant. If no one answers, please leave a voicemail as directed and we will return your call as soon as possible. Messages left after 4 pm will be answered the following business day.   You may also send us a message via MyChart. We typically respond to MyChart messages within 1-2 business days.  For prescription refills, please ask your pharmacy to contact our office. Our fax number is [phone].  If you have an urgent issue when the clinic is closed that cannot wait until the next business day, you can page your doctor at the number below.    Please note that while we do our best to be available for urgent issues outside of office hours, we are not available 24/7.   If you have an urgent issue and are unable to reach us, you may choose to seek medical care at your doctor's office, retail clinic, urgent care center, or emergency room.  If you have a medical emergency, please immediately call 911 or go to the emergency department.  Pager Numbers  - Dr. Kowalski: [phone]  - Dr. Moye: [phone]  - Dr. Stewart: [phone]  In the event of inclement weather, please call our main line at [phone] for an update on the status of any delays or closures.  Dermatology Medication Tips: Please keep the boxes that topical medications come in in order to help keep track of the instructions about where and how to use these. Pharmacies typically print the medication instructions only on the boxes and not directly on the medication tubes.   If your medication is too expensive, please contact our office at [phone] option 4 or send us a message through MyChart.   We are unable to tell what your co-pay for medications will be in advance as this is different depending on your insurance coverage. However, we may be able to find a  substitute medication at lower cost or fill out paperwork to get insurance to cover a needed medication.   If a prior authorization is required to get your medication covered by your insurance company, please allow us 1-2 business days to complete this process.  Drug prices often vary depending on where the prescription is filled and some pharmacies may offer cheaper prices.  The website www.goodrx.com contains coupons for medications through different pharmacies. The prices here do not account for what the cost may be with help from insurance (it may be cheaper with your insurance), but the website can give you the price if you did not use any insurance.  - You can print the associated coupon and take it with your prescription to the pharmacy.  - You may also stop by our office during regular business hours and pick up a GoodRx coupon card.  - If you need your prescription sent electronically to a different pharmacy, notify our office through [facility] MyChart or by phone at [phone] option 4.     Wound Care Instructions  1. Cleanse wound gently with soap and water once a day then pat dry with clean gauze. Apply a thing coat of Petrolatum (petroleum jelly, "Vaseline") over the wound (unless you have an allergy to this). We recommend that you use a new, sterile tube of Vaseline. Do not pick or remove scabs. Do not remove the yellow or white "healing tissue" from the base of the wound.    2. Cover the wound with fresh, clean, nonstick gauze and secure with paper tape. You may use Band-Aids in place of gauze and tape if the would is small enough, but would recommend trimming much of the tape off as there is often too much. Sometimes Band-Aids can irritate the skin.  3. You should call the office for your biopsy report after 1 week if you have not already been contacted.  4. If you experience any problems, such as abnormal amounts of bleeding, swelling, significant bruising, significant pain,  or evidence of infection, please call the office immediately.  5. FOR ADULT SURGERY PATIENTS: If you need something for pain relief you may take 1 extra strength Tylenol (acetaminophen) AND 2 Ibuprofen (200mg each) together every 4 hours as needed for pain. (do not take these if you are allergic to them or if you have a reason you should not take them.) Typically, you may only need pain medication for 1 to 3 days.     

## 2020-11-04 ENCOUNTER — Telehealth: Payer: Self-pay

## 2020-11-04 ENCOUNTER — Encounter: Payer: Self-pay | Admitting: Dermatology

## 2020-11-04 NOTE — Telephone Encounter (Signed)
Left message for patient regarding surgery/hd  

## 2020-11-07 ENCOUNTER — Encounter: Payer: Self-pay | Admitting: Dermatology

## 2020-11-10 ENCOUNTER — Ambulatory Visit: Payer: No Typology Code available for payment source | Admitting: Gastroenterology

## 2020-11-10 ENCOUNTER — Other Ambulatory Visit: Payer: Self-pay

## 2020-11-10 ENCOUNTER — Encounter: Payer: Self-pay | Admitting: Gastroenterology

## 2020-11-10 ENCOUNTER — Ambulatory Visit (INDEPENDENT_AMBULATORY_CARE_PROVIDER_SITE_OTHER): Payer: No Typology Code available for payment source

## 2020-11-10 VITALS — BP 118/70 | HR 92 | Ht 71.0 in | Wt 190.0 lb

## 2020-11-10 DIAGNOSIS — Z4802 Encounter for removal of sutures: Secondary | ICD-10-CM

## 2020-11-10 DIAGNOSIS — K6389 Other specified diseases of intestine: Secondary | ICD-10-CM | POA: Diagnosis not present

## 2020-11-10 DIAGNOSIS — L720 Epidermal cyst: Secondary | ICD-10-CM

## 2020-11-10 MED ORDER — RIFAXIMIN 550 MG PO TABS
550.0000 mg | ORAL_TABLET | Freq: Three times a day (TID) | ORAL | 0 refills | Status: DC
  Filled 2020-11-10 – 2020-11-20 (×4): qty 42, 14d supply, fill #0

## 2020-11-10 MED ORDER — RIFAXIMIN 550 MG PO TABS
550.0000 mg | ORAL_TABLET | Freq: Three times a day (TID) | ORAL | 0 refills | Status: DC
  Filled 2020-11-10: qty 42, 14d supply, fill #0

## 2020-11-10 NOTE — Progress Notes (Signed)
Primary Care Physician: McLean-Scocuzza, Nino Glow, MD  Primary Gastroenterologist:  Dr. Lucilla Lame  Chief Complaint  Patient presents with  . Medication Refill    HPI: Thomas Shaw is a 35 y.o. male here with a report of continued bowel problems.  The patient is status post gastric bypass surgery and he has been treated in the past for bacterial overgrowth.  The patient did not tolerate pancreatic enzymes for exocrine pancreatic insufficiency in the past.  He does report that he felt better after taking the antibiotics for his bacterial overgrowth.  There is no report of any black stools or bloody stools but he does continue to have a sheen of oil on his stools when he uses the bathroom.  The patient reports that for 4 days few weeks ago he had passed gas in the melanite and soiled himself in his sheets.  It has not happened again in the last 3 weeks.  There is no report of any abdominal pain but he does report increased gas.  He also reports that he does partake in dairy intake.  Past Medical History:  Diagnosis Date  . Anemia, unspecified 02/22/2016   iron deficient  . Blood in stool   . COVID-19    05/2020 xmas  . Diarrhea   . Essential hypertension 12/30/2016  . Exocrine pancreatic insufficiency 10/15/2014  . Gallstones   . Gastro-esophageal reflux 12/30/2016  . Inguinal hernia   . Leukopenia   . Sleep apnea    hx of befor weight loss surgery none now  . Vasovagal syncope   . Vitamin D deficiency 12/30/2016    Current Outpatient Medications  Medication Sig Dispense Refill  . Cholecalciferol 1.25 MG (50000 UT) capsule TAKE 1 CAPSULE BY MOUTH 2 TIMES A WEEK. 18 capsule 2  . Copper Gluconate (COPPER CAPS) 2 MG CAPS Take 2 mg by mouth daily. 60 capsule 3  . fluticasone (FLONASE) 50 MCG/ACT nasal spray PLACE 2 SPRAYS INTO BOTH NOSTRILS DAILY AS NEEDED FOR ALLERGIES OR RHINITIS. 16 g 12  . Multiple Vitamin (MULTI-VITAMINS) TABS Take 1 tablet by mouth daily.     . mupirocin  ointment (BACTROBAN) 2 % Apply 1 application topically daily. Qd to excision site 22 g 1  . oxymetazoline (AFRIN NASAL SPRAY) 0.05 % nasal spray Place 1 spray into both nostrils 2 (two) times daily. (Patient taking differently: Place 1 spray into both nostrils 2 (two) times daily as needed.) 30 mL 0  . polycarbophil (FIBERCON) 625 MG tablet Take 625 mg by mouth daily.    Marland Kitchen triamterene-hydrochlorothiazide (MAXZIDE-25) 37.5-25 MG tablet TAKE ONE TABLET BY MOUTH DAILY AS NEEDED 90 tablet 2  . EPINEPHrine 0.3 mg/0.3 mL IJ SOAJ injection Inject 0.3 mLs (0.3 mg total) into the muscle once. Follow package instructions as needed for severe allergy or anaphylactic reaction. (Patient not taking: No sig reported) 1 Device 2   No current facility-administered medications for this visit.    Allergies as of 11/10/2020  . (No Known Allergies)    ROS:  General: Negative for anorexia, weight loss, fever, chills, fatigue, weakness. ENT: Negative for hoarseness, difficulty swallowing , nasal congestion. CV: Negative for chest pain, angina, palpitations, dyspnea on exertion, peripheral edema.  Respiratory: Negative for dyspnea at rest, dyspnea on exertion, cough, sputum, wheezing.  GI: See history of present illness. GU:  Negative for dysuria, hematuria, urinary incontinence, urinary frequency, nocturnal urination.  Endo: Negative for unusual weight change.    Physical Examination:   BP 118/70  Pulse 92   Ht 5\' 11"  (1.803 m)   Wt 190 lb (86.2 kg)   BMI 26.50 kg/m   General: Well-nourished, well-developed in no acute distress.  Eyes: No icterus. Conjunctivae pink. Neuro: Alert and oriented x 3.  Grossly intact. Skin: Warm and dry, no jaundice.   Psych: Alert and cooperative, normal mood and affect.  Labs:    Imaging Studies: No results found.  Assessment and Plan:   [name] is a 35 y.o. y/o male who comes in with a history of bacterial overgrowth that has improved on antibiotics in  the past.  The patient will be started again on antibiotics.  He did not tolerate pancreatic enzymes in the past and he reports they did not help him very much.  The patient has been told to decrease the amount of dairy he takes in and will be retreated with antibiotics.  The patient has been explained the plan agrees with it.     Lucilla Lame, MD. Marval Regal    Note: This dictation was prepared with Dragon dictation along with smaller phrase technology. Any transcriptional errors that result from this process are unintentional.

## 2020-11-10 NOTE — Patient Instructions (Signed)

## 2020-11-10 NOTE — Progress Notes (Signed)
   Follow-Up Visit   Subjective  Cleatus Gabriel is a 35 y.o. male who presents for the following: Suture / Staple Removal (7 day post excision suture removal for biopsy proven epidermoid cyst with scarring).    The following portions of the chart were reviewed this encounter and updated as appropriate:        Objective  Well appearing patient in no apparent distress; mood and affect are within normal limits.    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  - Discussed pathology results showing epidermoid cyst with scarring.   - Patient advised to keep steri-strips dry until they fall off. - Scars remodel for a full year. - Once steri-strips fall off, patient can apply over-the-counter silicone scar cream each night to help with scar remodeling if desired. - Patient advised to call with any concerns or if they notice any new or changing lesions.    No follow-ups on file.   I, Harriett Sine, CMA, am acting as scribe for [employer], CMA.

## 2020-11-11 ENCOUNTER — Other Ambulatory Visit: Payer: Self-pay

## 2020-11-20 ENCOUNTER — Other Ambulatory Visit: Payer: Self-pay

## 2021-02-08 ENCOUNTER — Inpatient Hospital Stay: Payer: No Typology Code available for payment source | Admitting: Oncology

## 2021-02-08 ENCOUNTER — Inpatient Hospital Stay: Payer: No Typology Code available for payment source

## 2021-03-22 ENCOUNTER — Inpatient Hospital Stay: Payer: No Typology Code available for payment source

## 2021-03-22 ENCOUNTER — Inpatient Hospital Stay: Payer: No Typology Code available for payment source | Admitting: Oncology

## 2021-04-13 ENCOUNTER — Inpatient Hospital Stay: Payer: No Typology Code available for payment source | Admitting: Oncology

## 2021-04-13 ENCOUNTER — Inpatient Hospital Stay: Payer: No Typology Code available for payment source | Attending: Oncology

## 2021-04-13 ENCOUNTER — Other Ambulatory Visit: Payer: Self-pay

## 2021-04-13 ENCOUNTER — Telehealth: Payer: Self-pay | Admitting: *Deleted

## 2021-04-13 ENCOUNTER — Encounter: Payer: Self-pay | Admitting: Oncology

## 2021-04-13 VITALS — BP 118/71 | HR 76 | Temp 98.3°F | Resp 16 | Ht 71.0 in | Wt 197.8 lb

## 2021-04-13 DIAGNOSIS — D72819 Decreased white blood cell count, unspecified: Secondary | ICD-10-CM | POA: Diagnosis not present

## 2021-04-13 DIAGNOSIS — G4730 Sleep apnea, unspecified: Secondary | ICD-10-CM | POA: Diagnosis not present

## 2021-04-13 DIAGNOSIS — K219 Gastro-esophageal reflux disease without esophagitis: Secondary | ICD-10-CM | POA: Insufficient documentation

## 2021-04-13 DIAGNOSIS — K449 Diaphragmatic hernia without obstruction or gangrene: Secondary | ICD-10-CM | POA: Insufficient documentation

## 2021-04-13 DIAGNOSIS — D708 Other neutropenia: Secondary | ICD-10-CM | POA: Diagnosis not present

## 2021-04-13 DIAGNOSIS — E559 Vitamin D deficiency, unspecified: Secondary | ICD-10-CM | POA: Insufficient documentation

## 2021-04-13 DIAGNOSIS — D709 Neutropenia, unspecified: Secondary | ICD-10-CM | POA: Diagnosis present

## 2021-04-13 DIAGNOSIS — D649 Anemia, unspecified: Secondary | ICD-10-CM | POA: Insufficient documentation

## 2021-04-13 DIAGNOSIS — I10 Essential (primary) hypertension: Secondary | ICD-10-CM | POA: Insufficient documentation

## 2021-04-13 DIAGNOSIS — R197 Diarrhea, unspecified: Secondary | ICD-10-CM | POA: Insufficient documentation

## 2021-04-13 DIAGNOSIS — Z8616 Personal history of COVID-19: Secondary | ICD-10-CM | POA: Insufficient documentation

## 2021-04-13 DIAGNOSIS — E610 Copper deficiency: Secondary | ICD-10-CM | POA: Diagnosis not present

## 2021-04-13 DIAGNOSIS — D509 Iron deficiency anemia, unspecified: Secondary | ICD-10-CM

## 2021-04-13 LAB — CBC WITH DIFFERENTIAL/PLATELET
Abs Immature Granulocytes: 0 10*3/uL (ref 0.00–0.07)
Basophils Absolute: 0 10*3/uL (ref 0.0–0.1)
Basophils Relative: 1 %
Eosinophils Absolute: 0.1 10*3/uL (ref 0.0–0.5)
Eosinophils Relative: 2 %
HCT: 33.1 % — ABNORMAL LOW (ref 39.0–52.0)
Hemoglobin: 10.7 g/dL — ABNORMAL LOW (ref 13.0–17.0)
Immature Granulocytes: 0 %
Lymphocytes Relative: 52 %
Lymphs Abs: 2.1 10*3/uL (ref 0.7–4.0)
MCH: 28.6 pg (ref 26.0–34.0)
MCHC: 32.3 g/dL (ref 30.0–36.0)
MCV: 88.5 fL (ref 80.0–100.0)
Monocytes Absolute: 0.3 10*3/uL (ref 0.1–1.0)
Monocytes Relative: 8 %
Neutro Abs: 1.4 10*3/uL — ABNORMAL LOW (ref 1.7–7.7)
Neutrophils Relative %: 37 %
Platelets: 142 10*3/uL — ABNORMAL LOW (ref 150–400)
RBC: 3.74 MIL/uL — ABNORMAL LOW (ref 4.22–5.81)
RDW: 13.1 % (ref 11.5–15.5)
WBC: 4 10*3/uL (ref 4.0–10.5)
nRBC: 0 % (ref 0.0–0.2)

## 2021-04-13 LAB — IRON AND TIBC
Iron: 64 ug/dL (ref 45–182)
Saturation Ratios: 14 % — ABNORMAL LOW (ref 17.9–39.5)
TIBC: 465 ug/dL — ABNORMAL HIGH (ref 250–450)
UIBC: 401 ug/dL

## 2021-04-13 LAB — FERRITIN: Ferritin: 12 ng/mL — ABNORMAL LOW (ref 24–336)

## 2021-04-13 LAB — VITAMIN B12: Vitamin B-12: 850 pg/mL (ref 180–914)

## 2021-04-13 NOTE — Telephone Encounter (Signed)
-----   Message from Sindy Guadeloupe, MD sent at 04/13/2021  3:44 PM EDT ----- He is anemic. Iron is low. 1. I would recommend GI referral to rule out blood loss. Is he agreeable? 2. Does he want to try oral iron or come for IV iron?Marland Kitchen

## 2021-04-13 NOTE — Telephone Encounter (Signed)
Called pt and let him know the iron levels. Dr Janese Banks wanted pt to see GI for possible blood loss. He already sees dr Roselyn Reef and we will refer him to him abou this. I asked about oral iron- taking pill on full stomach, can add vit. C to help absorb it. It can irritate stomach, cramps. The other is IV iron 5 treatments. Will be her 1 1/2 hours each time. He is not good to take pills and wonders about will he have copay each visit for the iron treatments. I will ask culeasha about this and let him know tom. He is agreeable and he is ok to send message for Dr Allen Norris

## 2021-04-13 NOTE — Progress Notes (Signed)
Pt states that he stopped the copper about 4 months due to pt have leg cramps really bad

## 2021-04-14 NOTE — Progress Notes (Signed)
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  Other medical problems include hypertension and GERD.  He has been referred to [place] for neutropenia.  Most recent CBC from 04/13/2020 showed white count of 3.7, H&H of 11.2/34.2 with a platelet count of 169.  Differential showed mild neutropenia with an ANC of 1.3 with relative lymphocytosis but absolute lymphocyte count was normal.  Looking back at his prior CBCs his white count does wax and wane between 2.8-14 and he has had mild intermittent neutropenia in the past.  He denies any recurrent infections or hospitalizations.  Denies any unintentional weight loss.  Denies any recent change in his medications.  Patient has also had mild chronic anemia and his hemoglobin typically runs around 11.   Results of blood work from 04/28/2020 were as follows: CBC showed white count of 4.4, H&H of 11.5/34.9 with an MCV of 87 and a platelet count of 199.  ANC was 1.5.  CMP, folate, reticulocyte count, TSH was within normal limits.  HIV and hepatitis C were negative.  B1 B6 was normal.  Myeloma panel showed no M protein.  Haptoglobin was less than 10.  Smear review was unremarkable.  Copper levels were low at 12.  Interval history-patient stopped taking his copper supplements about 4 months ago when he started developing bilateral leg  cramps.  Patient reports doing well presently and denies any specific complaints at this time  ECOG PS- 0 Pain scale- 0   Review of systems- Review of Systems  Constitutional:  Negative for chills, fever, malaise/fatigue and weight loss.  HENT:  Negative for congestion, ear discharge and nosebleeds.   Eyes:  Negative for blurred vision.  Respiratory:  Negative for cough, hemoptysis, sputum production, shortness of breath and wheezing.   Cardiovascular:  Negative for chest pain, palpitations, orthopnea and claudication.  Gastrointestinal:  Negative for abdominal pain, blood in stool, constipation, diarrhea, heartburn, melena, nausea and vomiting.  Genitourinary:  Negative for dysuria, flank pain, frequency, hematuria and urgency.  Musculoskeletal:  Negative for back pain, joint pain and myalgias.  Skin:  Negative for rash.  Neurological:  Negative for dizziness, tingling, focal weakness, seizures, weakness and headaches.  Endo/Heme/Allergies:  Does not bruise/bleed easily.  Psychiatric/Behavioral:  Negative for depression and suicidal ideas. The patient does not have insomnia.      No Known Allergies   Past Medical History:  Diagnosis Date   Anemia, unspecified 02/22/2016   iron deficient   Blood in stool    COVID-19    05/2020 xmas   Diarrhea    Essential hypertension 12/30/2016   Exocrine pancreatic insufficiency 10/15/2014   Gallstones    Gastro-esophageal reflux 12/30/2016   Inguinal hernia    Leukopenia    Sleep apnea    hx of befor weight loss surgery none now   Vasovagal syncope    Vitamin D deficiency 12/30/2016     Past Surgical History:  Procedure Laterality Date  BARIATRIC SURGERY  07/31/2013   sleeve gastrectomy with duodenal switch   CHOLECYSTECTOMY N/A 01/04/2019   Procedure: LAPAROSCOPIC CHOLECYSTECTOMY with intraoperative cholangiogram;  Surgeon: Johnathan Hausen, MD;  Location: [facility];  Service: General;  Laterality: N/A;   INGUINAL HERNIA REPAIR Right  01/04/2019   Procedure: OPEN RIGHT INGUINAL HERNIA REPAIR;  Surgeon: Johnathan Hausen, MD;  Location: [facility];  Service: General;  Laterality: Right;    Social History   Socioeconomic History   Marital status: Married    Spouse name: Not on file   Number of children: Not on file   Years of education: Not on file   Highest education level: Not on file  Occupational History   Not on file  Tobacco Use   Smoking status: Never   Smokeless tobacco: Never  Vaping Use   Vaping Use: Never used  Substance and Sexual Activity   Alcohol use: Yes    Comment: rarely   Drug use: No   Sexual activity: Yes  Other Topics Concern   Not on file  Social History Narrative   Married    Works in [employer] patient is a 35 year old African-American male with a past medical history significant for sleeve gastrectomy with duodenal switch back in 2015.  Other medical problems include hypertension, GERD   No guns    Wears seat belt    Safe in relationship    [employer] and [employer]   Baby of 3 kids   Social Determinants of Health   Financial Resource Strain: Not on file  Food Insecurity: Not on file  Transportation Needs: Not on file  Physical Activity: Not on file  Stress: Not on file  Social Connections: Not on file  Intimate Partner Violence: Not on file    Family History  Problem Relation Age of Onset   Pancreatic disease Father    Diabetes Father    Hypertension Father    Diabetes Mother    Hypertension Mother    Hypertension Brother        ?   Prostate cancer Neg Hx    Kidney cancer Neg Hx    Bladder Cancer Neg Hx      Current Outpatient Medications:    EPINEPHrine 0.3 mg/0.3 mL IJ SOAJ injection, Inject 0.3 mLs (0.3 mg total) into the muscle once. Follow package instructions as needed for severe allergy or anaphylactic reaction., Disp: 1 Device, Rfl: 2   FIBER ADULT GUMMIES PO, Take 1 Dose by mouth daily., Disp: , Rfl:    fluticasone (FLONASE) 50 MCG/ACT nasal spray, PLACE 2  SPRAYS INTO BOTH NOSTRILS DAILY AS NEEDED FOR ALLERGIES OR RHINITIS., Disp: 16 g, Rfl: 12   Multiple Vitamin (MULTI-VITAMINS) TABS, Take 1 tablet by mouth daily. , Disp: , Rfl:   Physical exam:  Vitals:   04/13/21 1447  BP: 118/71  Pulse: 76  Resp: 16  Temp: 98.3 F (36.8 C)  Weight: 197 lb 12.8 oz (89.7 kg)  Height: $Remove'5\' 11"'MsHdsAA$  (1.803 m)   Physical Exam Constitutional:      General: He is not in acute distress. Cardiovascular:     Rate and Rhythm: Normal rate and regular rhythm.     Heart sounds: Normal heart sounds.  Pulmonary:     Effort: Pulmonary effort is normal.     Breath sounds: Normal breath sounds.  Abdominal:     General: Bowel sounds are normal.     Palpations: Abdomen is soft.     Comments: No palpable hepatosplenomegaly  Lymphadenopathy:     Comments: No palpable cervical, supraclavicular, axillary or inguinal adenopathy    Skin:    General: Skin is warm and dry.  Neurological:     Mental Status: He is alert and oriented to person, place, and time.     CMP Latest Ref Rng & Units 04/28/2020  Glucose 70 - 99 mg/dL 76  BUN 6 - 20 mg/dL 11  Creatinine 0.61 - 1.24 mg/dL 1.01  Sodium 135 - 145 mmol/L 140  Potassium 3.5 - 5.1 mmol/L 4.2  Chloride 98 - 111 mmol/L 109  CO2 22 - 32 mmol/L 24  Calcium 8.9 - 10.3 mg/dL 8.6(L)  Total Protein 6.5 - 8.1 g/dL 6.9  Total Bilirubin 0.3 - 1.2 mg/dL 0.8  Alkaline Phos 38 - 126 U/L 85  AST 15 - 41 U/L 29  ALT 0 - 44 U/L 29   CBC Latest Ref Rng & Units 04/13/2021  WBC 4.0 - 10.5 K/uL 4.0  Hemoglobin 13.0 - 17.0 g/dL 10.7(L)  Hematocrit 39.0 - 52.0 % 33.1(L)  Platelets 150 - 400 K/uL 142(L)   Assessment and plan- Patient is a 35 y.o. male here for routine follow-up of neutropenia and anemia  Neutropenia:Patient has had waxing and waning low white count/neutropenia intermittently even back in 2015 when his ANC was 1.3.  Presently his [place] is 1.4 with a normal white count of 4.  Given overall stability of his neutropenia he  does not require a bone marrow biopsy at this time.  Patient was found to have low copper levels in the past and did take copper supplements briefly.  His copper levels from today are pending.  Anemia:His labs today are suggestive of iron deficiency with an iron saturation of 14% and elevated TIBC of 465.  These labs came back after the patient left the clinic.  Ferritin levels were also low at 12.  We will therefore reach out to the patient to see if he would like to try oral iron versus IV iron.  Also it would be prudent for patient to get a GI work-up for his iron deficiency anemia  CBC with differential in 3 months in 6 months and I will see him back in 6 months.  We will also check ferritin and iron studies B12 and copper levels in 6 months   Visit Diagnosis 1. Normocytic anemia   2. Other neutropenia ([place])      Dr. Randa Evens, MD, MPH [facility] [phone] 04/14/2021 8:13 AM

## 2021-04-15 ENCOUNTER — Other Ambulatory Visit: Payer: Self-pay | Admitting: Internal Medicine

## 2021-04-15 ENCOUNTER — Other Ambulatory Visit: Payer: Self-pay

## 2021-04-15 DIAGNOSIS — E559 Vitamin D deficiency, unspecified: Secondary | ICD-10-CM

## 2021-04-15 LAB — COPPER, SERUM: Copper: 20 ug/dL — ABNORMAL LOW (ref 69–132)

## 2021-04-15 MED ORDER — D3-50 1.25 MG (50000 UT) PO CAPS
ORAL_CAPSULE | ORAL | 2 refills | Status: DC
  Filled 2021-04-15: qty 18, 63d supply, fill #0

## 2021-04-22 ENCOUNTER — Other Ambulatory Visit: Payer: Self-pay | Admitting: Oncology

## 2021-04-22 DIAGNOSIS — D508 Other iron deficiency anemias: Secondary | ICD-10-CM

## 2021-04-22 DIAGNOSIS — D509 Iron deficiency anemia, unspecified: Secondary | ICD-10-CM | POA: Insufficient documentation

## 2021-04-26 ENCOUNTER — Other Ambulatory Visit: Payer: No Typology Code available for payment source

## 2021-04-26 ENCOUNTER — Telehealth: Payer: Self-pay | Admitting: *Deleted

## 2021-04-26 NOTE — Telephone Encounter (Signed)
I got the approval for the pt to get approval for 5 doses of venofer. Now that pt knows it is approved he would like to know the cost he will pay after his insurance covers. The line switched me to leave a message and I left message with the above info and left my drirect number [phone]

## 2021-04-27 ENCOUNTER — Other Ambulatory Visit (INDEPENDENT_AMBULATORY_CARE_PROVIDER_SITE_OTHER): Payer: No Typology Code available for payment source

## 2021-04-27 ENCOUNTER — Other Ambulatory Visit: Payer: Self-pay

## 2021-04-27 DIAGNOSIS — E559 Vitamin D deficiency, unspecified: Secondary | ICD-10-CM | POA: Diagnosis not present

## 2021-04-27 LAB — VITAMIN D 25 HYDROXY (VIT D DEFICIENCY, FRACTURES): VITD: 21.33 ng/mL — ABNORMAL LOW (ref 30.00–100.00)

## 2021-04-28 ENCOUNTER — Other Ambulatory Visit: Payer: Self-pay

## 2021-04-28 ENCOUNTER — Encounter: Payer: Self-pay | Admitting: Oncology

## 2021-04-28 DIAGNOSIS — E559 Vitamin D deficiency, unspecified: Secondary | ICD-10-CM

## 2021-04-28 MED ORDER — D3-50 1.25 MG (50000 UT) PO CAPS
50000.0000 [IU] | ORAL_CAPSULE | ORAL | 3 refills | Status: DC
  Filled 2021-04-28: qty 36, 84d supply, fill #0
  Filled 2021-10-08: qty 36, 84d supply, fill #1
  Filled 2022-01-20: qty 36, 84d supply, fill #2

## 2021-04-30 ENCOUNTER — Encounter: Payer: Self-pay | Admitting: Internal Medicine

## 2021-04-30 ENCOUNTER — Other Ambulatory Visit: Payer: Self-pay

## 2021-04-30 ENCOUNTER — Ambulatory Visit (INDEPENDENT_AMBULATORY_CARE_PROVIDER_SITE_OTHER): Payer: No Typology Code available for payment source | Admitting: Internal Medicine

## 2021-04-30 VITALS — BP 126/80 | HR 75 | Temp 97.0°F | Ht 71.0 in | Wt 200.2 lb

## 2021-04-30 DIAGNOSIS — Z9884 Bariatric surgery status: Secondary | ICD-10-CM

## 2021-04-30 DIAGNOSIS — K921 Melena: Secondary | ICD-10-CM

## 2021-04-30 DIAGNOSIS — K649 Unspecified hemorrhoids: Secondary | ICD-10-CM | POA: Diagnosis not present

## 2021-04-30 DIAGNOSIS — R590 Localized enlarged lymph nodes: Secondary | ICD-10-CM

## 2021-04-30 DIAGNOSIS — R1031 Right lower quadrant pain: Secondary | ICD-10-CM | POA: Diagnosis not present

## 2021-04-30 DIAGNOSIS — K4091 Unilateral inguinal hernia, without obstruction or gangrene, recurrent: Secondary | ICD-10-CM | POA: Diagnosis not present

## 2021-04-30 NOTE — Progress Notes (Addendum)
Chief Complaint  Patient presents with   Blood In Stools   F/u  1. Brbpr with blood clots x 1.5 weeks h/o hemorrhoids  2. Right abdomen lower 3/10; Right groin pain s/p lymph node bx in 01/04/2019, hernia repair with mesh Dr.Matthew Hassell Done  Disc with him today and rec CT ab/pelvis with and w/o contrast  3. Grief dad 60 died 04-19-21 ? Cause he was losing weight given [id number] [phone]   Review of Systems  Constitutional:  Negative for weight loss.  HENT:  Negative for hearing loss.   Eyes:  Negative for blurred vision.  Respiratory:  Negative for shortness of breath.   Cardiovascular:  Negative for chest pain.  Gastrointestinal:  Positive for abdominal pain and blood in stool.  Musculoskeletal:  Negative for back pain.  Skin:  Negative for rash.  Neurological:  Negative for headaches.  Psychiatric/Behavioral:  Negative for depression.   Past Medical History:  Diagnosis Date   Anemia, unspecified 02/22/2016   iron deficient   Blood in stool    COVID-19    05/2020 xmas   Diarrhea    Essential hypertension 12/30/2016   Exocrine pancreatic insufficiency 10/15/2014   Gallstones    Gastro-esophageal reflux 12/30/2016   Inguinal hernia    Leukopenia    Sleep apnea    hx of befor weight loss surgery none now   Vasovagal syncope    Vitamin D deficiency 12/30/2016   Past Surgical History:  Procedure Laterality Date   BARIATRIC SURGERY  07/31/2013   sleeve gastrectomy with duodenal switch   CHOLECYSTECTOMY N/A 01/04/2019   Procedure: LAPAROSCOPIC CHOLECYSTECTOMY with intraoperative cholangiogram;  Surgeon: Johnathan Hausen, MD;  Location: [facility];  Service: General;  Laterality: N/A;   INGUINAL HERNIA REPAIR Right 01/04/2019   Procedure: OPEN RIGHT INGUINAL HERNIA REPAIR;  Surgeon: Johnathan Hausen, MD;  Location: [facility];  Service: General;  Laterality: Right;   Family History  Problem Relation Age of Onset   Diabetes Mother    Hypertension Mother    Pancreatic disease Father    Diabetes  Father    Hypertension Father    Other Father        wt loss ? cause cancer had wt loss   Hypertension Brother        ?   Prostate cancer Neg Hx    Kidney cancer Neg Hx    Bladder Cancer Neg Hx    Social History   Socioeconomic History   Marital status: Married    Spouse name: Not on file   Number of children: Not on file   Years of education: Not on file   Highest education level: Not on file  Occupational History   Not on file  Tobacco Use   Smoking status: Never   Smokeless tobacco: Never  Vaping Use   Vaping Use: Never used  Substance and Sexual Activity   Alcohol use: Yes    Comment: rarely   Drug use: No   Sexual activity: Yes  Other Topics Concern   Not on file  Social History Narrative   Married    Works in [employer] patient is a 35 year old African-American male with a past medical history significant for sleeve gastrectomy with duodenal switch back in 2015.  Other medical problems include hypertension, GERD   No guns    Wears seat belt    Safe in relationship    [employer] [facility] and [employer]   Baby of 3 kids   Social Determinants of  Health   Financial Resource Strain: Not on file  Food Insecurity: Not on file  Transportation Needs: Not on file  Physical Activity: Not on file  Stress: Not on file  Social Connections: Not on file  Intimate Partner Violence: Not on file   Current Meds  Medication Sig   Cholecalciferol (D3-50) 1.25 MG (50000 UT) capsule Take 1 capsule (50,000 Units total) by mouth 3 (three) times a week.   EPINEPHrine 0.3 mg/0.3 mL IJ SOAJ injection Inject 0.3 mLs (0.3 mg total) into the muscle once. Follow package instructions as needed for severe allergy or anaphylactic reaction.   FIBER ADULT GUMMIES PO Take 1 Dose by mouth daily.   fluticasone (FLONASE) 50 MCG/ACT nasal spray PLACE 2 SPRAYS INTO BOTH NOSTRILS DAILY AS NEEDED FOR ALLERGIES OR RHINITIS.   Multiple Vitamin (MULTI-VITAMINS) TABS Take 1 tablet by mouth daily.    No Known  Allergies Recent Results (from the past 2160 hour(s))  Copper, serum     Status: Abnormal   Collection Time: 04/13/21  1:51 PM  Result Value Ref Range   Copper 20 (L) 69 - 132 ug/dL    Comment: (NOTE) **Verified by repeat analysis** This test was developed and its performance characteristics determined by [facility]. It has not been cleared or approved by the Food and Drug Administration.                                Detection Limit = 5 Performed At: [facility] [place], [place] [id number] Rush Farmer MD IN:[id number]   Vitamin B12     Status: None   Collection Time: 04/13/21  1:51 PM  Result Value Ref Range   Vitamin B-12 850 180 - 914 pg/mL    Comment: (NOTE) This assay is not validated for testing neonatal or myeloproliferative syndrome specimens for Vitamin B12 levels. Performed at [facility], [place] [address] [place], [place] [address]   Iron and TIBC     Status: Abnormal   Collection Time: 04/13/21  1:51 PM  Result Value Ref Range   Iron 64 45 - 182 ug/dL   TIBC 465 (H) 250 - 450 ug/dL   Saturation Ratios 14 (L) 17.9 - 39.5 %   UIBC 401 ug/dL    Comment: Performed at [facility], [address] [place], [place] [address]  Ferritin     Status: Abnormal   Collection Time: 04/13/21  1:51 PM  Result Value Ref Range   Ferritin 12 (L) 24 - 336 ng/mL    Comment: Performed at [facility], [address] [place], [place] [address]  CBC with Differential     Status: Abnormal   Collection Time: 04/13/21  1:51 PM  Result Value Ref Range   WBC 4.0 4.0 - 10.5 K/uL   RBC 3.74 (L) 4.22 - 5.81 MIL/uL   Hemoglobin 10.7 (L) 13.0 - 17.0 g/dL   HCT 33.1 (L) 39.0 - 52.0 %   MCV 88.5 80.0 - 100.0 fL   MCH 28.6 26.0 - 34.0 pg   MCHC 32.3 30.0 - 36.0 g/dL   RDW 13.1 11.5 - 15.5 %   Platelets 142 (L) 150 - 400 K/uL   nRBC 0.0 0.0 - 0.2 %   Neutrophils Relative % 37 %   Neutro Abs 1.4 (L) 1.7 - 7.7 K/uL   Lymphocytes Relative  52 %   Lymphs Abs 2.1 0.7 - 4.0  K/uL   Monocytes Relative 8 %   Monocytes Absolute 0.3 0.1 - 1.0 K/uL   Eosinophils Relative 2 %   Eosinophils Absolute 0.1 0.0 - 0.5 K/uL   Basophils Relative 1 %   Basophils Absolute 0.0 0.0 - 0.1 K/uL   Immature Granulocytes 0 %   Abs Immature Granulocytes 0.00 0.00 - 0.07 K/uL    Comment: Performed at [facility], [address] [place], [place] [address]  Vitamin D (25 hydroxy)     Status: Abnormal   Collection Time: 04/27/21  9:20 AM  Result Value Ref Range   VITD 21.33 (L) 30.00 - 100.00 ng/mL   Objective  Body mass index is 27.92 kg/m. Wt Readings from Last 3 Encounters:  04/30/21 200 lb 3.2 oz (90.8 kg)  04/13/21 197 lb 12.8 oz (89.7 kg)  11/10/20 190 lb (86.2 kg)   Temp Readings from Last 3 Encounters:  04/30/21 (!) 97 F (36.1 C) (Temporal)  04/13/21 98.3 F (36.8 C)  08/10/20 98.6 F (37 C) (Oral)   BP Readings from Last 3 Encounters:  04/30/21 126/80  04/13/21 118/71  11/10/20 118/70   Pulse Readings from Last 3 Encounters:  04/30/21 75  04/13/21 76  11/10/20 92    Physical Exam Vitals and nursing note reviewed.  Constitutional:      Appearance: Normal appearance. He is well-developed and well-groomed. He is obese.  HENT:     Head: Normocephalic and atraumatic.  Eyes:     Conjunctiva/sclera: Conjunctivae normal.     Pupils: Pupils are equal, round, and reactive to light.  Cardiovascular:     Rate and Rhythm: Normal rate and regular rhythm.     Heart sounds: Normal heart sounds.  Pulmonary:     Effort: Pulmonary effort is normal. No respiratory distress.     Breath sounds: Normal breath sounds.  Abdominal:     Tenderness: There is no abdominal tenderness.    Musculoskeletal:     Lumbar back: Tenderness present. Negative right straight leg raise test and negative left straight leg raise test.  Skin:    General: Skin is warm and moist.  Neurological:     General: No focal deficit present.     Mental  Status: He is alert and oriented to person, place, and time. Mental status is at baseline.     Sensory: Sensation is intact.     Motor: Motor function is intact.     Coordination: Coordination is intact.     Gait: Gait is intact. Gait normal.  Psychiatric:        Attention and Perception: Attention and perception normal.        Mood and Affect: Mood and affect normal.        Speech: Speech normal.        Behavior: Behavior normal. Behavior is cooperative.        Thought Content: Thought content normal.        Cognition and Memory: Cognition and memory normal.        Judgment: Judgment normal.    Assessment  Plan  Blood in stool h/o hemorrhoids- Plan: Ambulatory referral to Gastroenterology, Creatinine, Ct AB/pelvis   RLQ ab pain, Right groin pain with h/o hernia repair on the right 01/04/2019 with mesh  H/o lymphadenopathy Unilateral recurrent inguinal hernia without obstruction or gangrene - Plan: CT A/PELVIS W  CONTRAST, Creatinine, see HPI spoke with Dr. Johnathan Hausen who rec CT pelvis to further w/u s/p repair 01/04/2019   Hemorrhoids,  unspecified hemorrhoid type - Plan: Creatinine  S/P gastric bypass - Plan: CT AB/PELVIS W CONTRAST, Creatinine  Grief  Dad died April 26, 2021  Given # [facility] EACP therapy   hm Flu shot utd  Pfizer 2/2 consider booster had [facility]  Tdap 07/23/20  Consider hep B in future not immune declined prev  MMR immune    Hep C neg 09/19/17  HIV neg 02/14/17  EGD 05/16/13 normal  Colonoscopy and EGD rec now due to iron def but could be due to s/p gastric bypass  Referred Dr. Allen Norris rec healthy diet and exercise    GI Dr. Lorie Apley Dr.K  Surgery Dr. Hassell Done for hemorrhoids   Provider: Dr. Olivia Mackie McLean-Scocuzza-Internal Medicine

## 2021-04-30 NOTE — Addendum Note (Signed)
Addended by: Orland Mustard on: 04/30/2021 05:27 PM   Modules accepted: Orders

## 2021-05-04 ENCOUNTER — Ambulatory Visit: Payer: No Typology Code available for payment source

## 2021-05-04 ENCOUNTER — Encounter: Payer: Self-pay | Admitting: Oncology

## 2021-05-10 ENCOUNTER — Telehealth: Payer: Self-pay | Admitting: Internal Medicine

## 2021-05-10 NOTE — Telephone Encounter (Signed)
Hi  What happened to pts ct scan abdomen/pelvis?  Do not see it has been done?  We needed to approve abdomen as well   Thank you

## 2021-05-10 NOTE — Telephone Encounter (Signed)
Noted pt cancelled appt due to cost ct ab/pelvis with contrast

## 2021-05-21 NOTE — Telephone Encounter (Signed)
Spoke with patient. He was concerned about having a copayment or having to prepay. I let him know there will only be copayments when seeing the MD. We got him scheduled for 5 doses of venofer starting in Jan.

## 2021-05-25 NOTE — Progress Notes (Signed)
PCP put in ref. To GI already. About the iron he was interested in IV iron but wanted to know th cost. Ulice Dash looked into cost and he would not make a copay for the infusions and no up front payment. He does have 500.00 deductable and he would be billed for that. Juliann Pulse said somewhere from  $418 [phone]. Pt agreed and appts made for him for venofer

## 2021-05-31 ENCOUNTER — Encounter: Payer: Self-pay | Admitting: Gastroenterology

## 2021-05-31 ENCOUNTER — Ambulatory Visit (INDEPENDENT_AMBULATORY_CARE_PROVIDER_SITE_OTHER): Payer: Self-pay | Admitting: Gastroenterology

## 2021-05-31 ENCOUNTER — Other Ambulatory Visit: Payer: Self-pay

## 2021-05-31 VITALS — BP 110/74 | HR 73 | Temp 97.9°F | Ht 71.0 in | Wt 199.8 lb

## 2021-05-31 DIAGNOSIS — D509 Iron deficiency anemia, unspecified: Secondary | ICD-10-CM

## 2021-05-31 NOTE — Progress Notes (Signed)
Primary Care Physician: McLean-Scocuzza, Nino Glow, MD  Primary Gastroenterologist:  Dr. Lucilla Lame  Chief Complaint  Patient presents with   Iron deficiency anemia    HPI: Thomas Shaw is a 35 y.o. male here for iron deficiency anemia. The patient reports that he had a probably 2 weeks of bleeding from his rectum.  The bleeding has subsequently stopped.  He denies any abdominal pain nausea vomiting fevers or chills with it.  The patient has had bacterial overgrowth found in the past and was treated with antibiotic.  The patient now states that his diarrhea is off and on.  He has gained some weight without trying.  There is no report of any black stools or bloody stools.  He also denies any hematemesis. The patient has been found to have low iron saturation and low ferritin.  Past Medical History:  Diagnosis Date   Anemia, unspecified 02/22/2016   iron deficient   Blood in stool    COVID-19    05/2020 xmas   Diarrhea    Essential hypertension 12/30/2016   Exocrine pancreatic insufficiency 10/15/2014   Gallstones    Gastro-esophageal reflux 12/30/2016   Inguinal hernia    Leukopenia    Sleep apnea    hx of befor weight loss surgery none now   Vasovagal syncope    Vitamin D deficiency 12/30/2016    Current Outpatient Medications  Medication Sig Dispense Refill   Cholecalciferol (D3-50) 1.25 MG (50000 UT) capsule Take 1 capsule (50,000 Units total) by mouth 3 (three) times a week. 36 capsule 3   EPINEPHrine 0.3 mg/0.3 mL IJ SOAJ injection Inject 0.3 mLs (0.3 mg total) into the muscle once. Follow package instructions as needed for severe allergy or anaphylactic reaction. 1 Device 2   FIBER ADULT GUMMIES PO Take 1 Dose by mouth daily.     fluticasone (FLONASE) 50 MCG/ACT nasal spray PLACE 2 SPRAYS INTO BOTH NOSTRILS DAILY AS NEEDED FOR ALLERGIES OR RHINITIS. 16 g 12   Multiple Vitamin (MULTI-VITAMINS) TABS Take 1 tablet by mouth daily.      No current facility-administered  medications for this visit.    Allergies as of 05/31/2021   (No Known Allergies)    ROS:  General: Negative for anorexia, weight loss, fever, chills, fatigue, weakness. ENT: Negative for hoarseness, difficulty swallowing , nasal congestion. CV: Negative for chest pain, angina, palpitations, dyspnea on exertion, peripheral edema.  Respiratory: Negative for dyspnea at rest, dyspnea on exertion, cough, sputum, wheezing.  GI: See history of present illness. GU:  Negative for dysuria, hematuria, urinary incontinence, urinary frequency, nocturnal urination.  Endo: Negative for unusual weight change.    Physical Examination:   BP 110/74 (BP Location: Left Arm, Patient Position: Sitting, Cuff Size: Large)    Pulse 73    Temp 97.9 F (36.6 C) (Temporal)    Ht 5\' 11"  (1.803 m)    Wt 199 lb 12.8 oz (90.6 kg)    BMI 27.87 kg/m   General: Well-nourished, well-developed in no acute distress.  Eyes: No icterus. Conjunctivae pink. Lungs: Clear to auscultation bilaterally. Non-labored. Heart: Regular rate and rhythm, no murmurs rubs or gallops.  Abdomen: Bowel sounds are normal, nontender, nondistended, no hepatosplenomegaly or masses, no abdominal bruits or hernia , no rebound or guarding.   Extremities: No lower extremity edema. No clubbing or deformities. Neuro: Alert and oriented x 3.  Grossly intact. Skin: Warm and dry, no jaundice.   Psych: Alert and cooperative, normal mood and affect.  Labs:    Imaging Studies: No results found.  Assessment and Plan:   Thomas Shaw is a 35 y.o. y/o male who comes in today with a report of rectal bleeding and iron deficiency anemia.  The patient will be set up for a EGD and colonoscopy.  The patient would like to wait till the beginning of next year to schedule these. The patient has been explained the possibilities of why his iron is low and why he has iron deficiency anemia including GI bleeding and decreased absorption.  The patient has been  explained the plan and agrees with it.     Lucilla Lame, MD. Marval Regal    Note: This dictation was prepared with Dragon dictation along with smaller phrase technology. Any transcriptional errors that result from this process are unintentional.

## 2021-06-01 ENCOUNTER — Other Ambulatory Visit: Payer: No Typology Code available for payment source

## 2021-06-23 ENCOUNTER — Inpatient Hospital Stay: Payer: No Typology Code available for payment source | Attending: Oncology

## 2021-06-23 ENCOUNTER — Other Ambulatory Visit: Payer: Self-pay

## 2021-06-23 VITALS — BP 120/52 | HR 59 | Temp 97.5°F | Resp 20

## 2021-06-23 DIAGNOSIS — D508 Other iron deficiency anemias: Secondary | ICD-10-CM

## 2021-06-23 DIAGNOSIS — D509 Iron deficiency anemia, unspecified: Secondary | ICD-10-CM | POA: Diagnosis present

## 2021-06-23 MED ORDER — SODIUM CHLORIDE 0.9 % IV SOLN
Freq: Once | INTRAVENOUS | Status: AC
  Filled 2021-06-23: qty 250

## 2021-06-23 MED ORDER — IRON SUCROSE 20 MG/ML IV SOLN
200.0000 mg | Freq: Once | INTRAVENOUS | Status: AC
  Administered 2021-06-23: 200 mg via INTRAVENOUS
  Filled 2021-06-23: qty 10

## 2021-06-23 MED ORDER — SODIUM CHLORIDE 0.9 % IV SOLN: 200.0000 mg | INTRAVENOUS | Status: DC

## 2021-06-23 NOTE — Patient Instructions (Signed)

## 2021-06-25 ENCOUNTER — Other Ambulatory Visit: Payer: Self-pay

## 2021-06-25 ENCOUNTER — Inpatient Hospital Stay: Payer: No Typology Code available for payment source

## 2021-06-25 VITALS — BP 122/63 | HR 70 | Temp 96.8°F

## 2021-06-25 DIAGNOSIS — D509 Iron deficiency anemia, unspecified: Secondary | ICD-10-CM | POA: Diagnosis not present

## 2021-06-25 DIAGNOSIS — D508 Other iron deficiency anemias: Secondary | ICD-10-CM

## 2021-06-25 MED ORDER — SODIUM CHLORIDE 0.9 % IV SOLN: 200.0000 mg | INTRAVENOUS | Status: DC

## 2021-06-25 MED ORDER — IRON SUCROSE 20 MG/ML IV SOLN
200.0000 mg | Freq: Once | INTRAVENOUS | Status: AC
  Administered 2021-06-25: 200 mg via INTRAVENOUS
  Filled 2021-06-25: qty 10

## 2021-06-25 MED ORDER — SODIUM CHLORIDE 0.9 % IV SOLN
Freq: Once | INTRAVENOUS | Status: AC
  Filled 2021-06-25: qty 250

## 2021-06-25 NOTE — Patient Instructions (Signed)

## 2021-06-25 NOTE — Progress Notes (Signed)
1505: Pt reports feeling bloated, stating symptoms started approx 5 hours after last Venofer infusion. Dr. Janese Banks aware, per Dr. Janese Banks proceed with Venofer and pt to avoid gas producing foods. Pt aware and verbalizes understanding.   1530: Pt tolerated infusion well. Pt and VS stable at discharge.

## 2021-06-30 ENCOUNTER — Other Ambulatory Visit: Payer: No Typology Code available for payment source

## 2021-06-30 ENCOUNTER — Inpatient Hospital Stay: Payer: No Typology Code available for payment source

## 2021-06-30 ENCOUNTER — Other Ambulatory Visit: Payer: Self-pay

## 2021-06-30 VITALS — BP 116/68 | HR 69 | Temp 98.2°F | Resp 18

## 2021-06-30 DIAGNOSIS — D508 Other iron deficiency anemias: Secondary | ICD-10-CM

## 2021-06-30 DIAGNOSIS — D509 Iron deficiency anemia, unspecified: Secondary | ICD-10-CM | POA: Diagnosis not present

## 2021-06-30 MED ORDER — IRON SUCROSE 20 MG/ML IV SOLN
200.0000 mg | Freq: Once | INTRAVENOUS | Status: AC
  Administered 2021-06-30: 200 mg via INTRAVENOUS
  Filled 2021-06-30: qty 10

## 2021-06-30 MED ORDER — SODIUM CHLORIDE 0.9 % IV SOLN: 200.0000 mg | INTRAVENOUS | Status: DC

## 2021-06-30 MED ORDER — SODIUM CHLORIDE 0.9 % IV SOLN
Freq: Once | INTRAVENOUS | Status: AC
  Filled 2021-06-30: qty 250

## 2021-06-30 NOTE — Patient Instructions (Signed)
[facility]   ?Discharge Instructions: ?Thank you for choosing [facility] to provide your oncology and hematology care.  ?If you have a lab appointment with the Cancer Center, please go directly to the Cancer Center and check in at the registration area. ?  ?We strive to give you quality time with your provider. You may need to reschedule your appointment if you arrive late (15 or more minutes).  Arriving late affects you and other patients whose appointments are after yours.  Also, if you miss three or more appointments without notifying the office, you may be dismissed from the clinic at the provider?s discretion.    ?  ?For prescription refill requests, have your pharmacy contact our office and allow 72 hours for refills to be completed.   ? ?Today you received the following: Venofer.    ?  ?BELOW ARE SYMPTOMS THAT SHOULD BE REPORTED IMMEDIATELY: ?*FEVER GREATER THAN 100.4 F (38 ?C) OR HIGHER ?*CHILLS OR SWEATING ?*NAUSEA AND VOMITING THAT IS NOT CONTROLLED WITH YOUR NAUSEA MEDICATION ?*UNUSUAL SHORTNESS OF BREATH ?*UNUSUAL BRUISING OR BLEEDING ?*URINARY PROBLEMS (pain or burning when urinating, or frequent urination) ?*BOWEL PROBLEMS (unusual diarrhea, constipation, pain near the anus) ?TENDERNESS IN MOUTH AND THROAT WITH OR WITHOUT PRESENCE OF ULCERS (sore throat, sores in mouth, or a toothache) ?UNUSUAL RASH, SWELLING OR PAIN  ?UNUSUAL VAGINAL DISCHARGE OR ITCHING  ? ?Items with * indicate a potential emergency and should be followed up as soon as possible or go to the Emergency Department if any problems should occur. ? ?Should you have questions after your visit or need to cancel or reschedule your appointment, please contact [facility]  Dept: [phone]  and follow the prompts.  Office hours are 8:00 a.m. to 4:30 p.m. Monday - Friday. Please note that voicemails left after 4:00 p.m. may not be returned until the following  business day.  We are closed weekends and major holidays. You have access to a nurse at all times for urgent questions. Please call the main number to the clinic Dept: [phone] and follow the prompts. ? ?For any non-urgent questions, you may also contact your provider using MyChart. We now offer e-Visits for anyone 18 and older to request care online for non-urgent symptoms. For details visit [email or web address]. ?  ?Also download the MyChart app! Go to the app store, search "MyChart", open the app, select [place], and log in with your MyChart username and password. ? ?Due to Covid, a mask is required upon entering the hospital/clinic. If you do not have a mask, one will be given to you upon arrival. For doctor visits, patients may have 1 support person aged 18 or older with them. For treatment visits, patients cannot have anyone with them due to current Covid guidelines and our immunocompromised population.  ?

## 2021-07-02 ENCOUNTER — Other Ambulatory Visit: Payer: Self-pay

## 2021-07-02 ENCOUNTER — Inpatient Hospital Stay: Payer: No Typology Code available for payment source

## 2021-07-02 VITALS — BP 124/69 | HR 68 | Temp 97.5°F

## 2021-07-02 DIAGNOSIS — D508 Other iron deficiency anemias: Secondary | ICD-10-CM

## 2021-07-02 DIAGNOSIS — D509 Iron deficiency anemia, unspecified: Secondary | ICD-10-CM | POA: Diagnosis not present

## 2021-07-02 MED ORDER — SODIUM CHLORIDE 0.9 % IV SOLN
Freq: Once | INTRAVENOUS | Status: AC
  Filled 2021-07-02: qty 250

## 2021-07-02 MED ORDER — IRON SUCROSE 20 MG/ML IV SOLN
200.0000 mg | Freq: Once | INTRAVENOUS | Status: AC
  Administered 2021-07-02: 200 mg via INTRAVENOUS
  Filled 2021-07-02: qty 10

## 2021-07-02 MED ORDER — SODIUM CHLORIDE 0.9 % IV SOLN: 200.0000 mg | INTRAVENOUS | Status: DC

## 2021-07-07 ENCOUNTER — Other Ambulatory Visit: Payer: Self-pay | Admitting: *Deleted

## 2021-07-07 DIAGNOSIS — D508 Other iron deficiency anemias: Secondary | ICD-10-CM

## 2021-07-08 ENCOUNTER — Inpatient Hospital Stay: Payer: No Typology Code available for payment source

## 2021-07-09 ENCOUNTER — Telehealth: Payer: Self-pay | Admitting: Oncology

## 2021-07-09 NOTE — Telephone Encounter (Signed)
Pt called to reschedule his appt that he missed on 1-26. Call back at [phone]

## 2021-07-14 ENCOUNTER — Other Ambulatory Visit: Payer: Self-pay

## 2021-07-14 ENCOUNTER — Inpatient Hospital Stay: Payer: No Typology Code available for payment source | Attending: Oncology

## 2021-07-14 ENCOUNTER — Inpatient Hospital Stay: Payer: No Typology Code available for payment source

## 2021-07-14 ENCOUNTER — Other Ambulatory Visit: Payer: Self-pay | Admitting: *Deleted

## 2021-07-14 VITALS — BP 121/54 | HR 69 | Temp 98.9°F | Resp 18

## 2021-07-14 DIAGNOSIS — D508 Other iron deficiency anemias: Secondary | ICD-10-CM

## 2021-07-14 DIAGNOSIS — D649 Anemia, unspecified: Secondary | ICD-10-CM

## 2021-07-14 DIAGNOSIS — D509 Iron deficiency anemia, unspecified: Secondary | ICD-10-CM | POA: Diagnosis not present

## 2021-07-14 DIAGNOSIS — Z79899 Other long term (current) drug therapy: Secondary | ICD-10-CM | POA: Diagnosis not present

## 2021-07-14 LAB — COMPREHENSIVE METABOLIC PANEL
ALT: 26 U/L (ref 0–44)
AST: 26 U/L (ref 15–41)
Albumin: 4.3 g/dL (ref 3.5–5.0)
Alkaline Phosphatase: 95 U/L (ref 38–126)
Anion gap: 8 (ref 5–15)
BUN: 14 mg/dL (ref 6–20)
CO2: 23 mmol/L (ref 22–32)
Calcium: 8.8 mg/dL — ABNORMAL LOW (ref 8.9–10.3)
Chloride: 106 mmol/L (ref 98–111)
Creatinine, Ser: 0.96 mg/dL (ref 0.61–1.24)
GFR, Estimated: >60 mL/min (ref >60)
Glucose, Bld: 80 mg/dL (ref 70–99)
Potassium: 3.9 mmol/L (ref 3.5–5.1)
Sodium: 137 mmol/L (ref 135–145)
Total Bilirubin: 0.6 mg/dL (ref 0.3–1.2)
Total Protein: 7.1 g/dL (ref 6.5–8.1)

## 2021-07-14 LAB — CBC
HCT: 36.9 % — ABNORMAL LOW (ref 39.0–52.0)
Hemoglobin: 11.9 g/dL — ABNORMAL LOW (ref 13.0–17.0)
MCH: 28.3 pg (ref 26.0–34.0)
MCHC: 32.2 g/dL (ref 30.0–36.0)
MCV: 87.9 fL (ref 80.0–100.0)
Platelets: 173 10*3/uL (ref 150–400)
RBC: 4.2 MIL/uL — ABNORMAL LOW (ref 4.22–5.81)
RDW: 14.9 % (ref 11.5–15.5)
WBC: 4.8 10*3/uL (ref 4.0–10.5)
nRBC: 0 % (ref 0.0–0.2)

## 2021-07-14 LAB — IRON AND TIBC
Iron: 42 ug/dL — ABNORMAL LOW (ref 45–182)
Saturation Ratios: 10 % — ABNORMAL LOW (ref 17.9–39.5)
TIBC: 403 ug/dL (ref 250–450)
UIBC: 361 ug/dL

## 2021-07-14 LAB — FERRITIN: Ferritin: 108 ng/mL (ref 24–336)

## 2021-07-14 LAB — VITAMIN B12: Vitamin B-12: 663 pg/mL (ref 180–914)

## 2021-07-14 MED ORDER — SODIUM CHLORIDE 0.9 % IV SOLN: 200.0000 mg | INTRAVENOUS | Status: DC

## 2021-07-14 MED ORDER — SODIUM CHLORIDE 0.9 % IV SOLN
Freq: Once | INTRAVENOUS | Status: AC
  Filled 2021-07-14: qty 250

## 2021-07-14 MED ORDER — IRON SUCROSE 20 MG/ML IV SOLN
200.0000 mg | Freq: Once | INTRAVENOUS | Status: AC
  Administered 2021-07-14: 200 mg via INTRAVENOUS
  Filled 2021-07-14: qty 10

## 2021-07-14 NOTE — Patient Instructions (Signed)

## 2021-07-16 LAB — COPPER, SERUM: Copper: 22 ug/dL — ABNORMAL LOW (ref 69–132)

## 2021-07-21 ENCOUNTER — Encounter: Payer: Self-pay | Admitting: Internal Medicine

## 2021-07-22 ENCOUNTER — Other Ambulatory Visit: Payer: Self-pay

## 2021-07-22 DIAGNOSIS — D509 Iron deficiency anemia, unspecified: Secondary | ICD-10-CM

## 2021-07-22 MED ORDER — NA SULFATE-K SULFATE-MG SULF 17.5-3.13-1.6 GM/177ML PO SOLN
1.0000 | Freq: Once | ORAL | 0 refills | Status: AC
  Filled 2021-07-22: qty 354, 1d supply, fill #0

## 2021-07-28 ENCOUNTER — Other Ambulatory Visit (INDEPENDENT_AMBULATORY_CARE_PROVIDER_SITE_OTHER): Payer: No Typology Code available for payment source

## 2021-07-28 ENCOUNTER — Other Ambulatory Visit: Payer: Self-pay | Admitting: Internal Medicine

## 2021-07-28 ENCOUNTER — Other Ambulatory Visit: Payer: Self-pay

## 2021-07-28 DIAGNOSIS — J309 Allergic rhinitis, unspecified: Secondary | ICD-10-CM

## 2021-07-28 DIAGNOSIS — E559 Vitamin D deficiency, unspecified: Secondary | ICD-10-CM | POA: Diagnosis not present

## 2021-07-28 LAB — COMPREHENSIVE METABOLIC PANEL
ALT: 30 U/L (ref 0–53)
AST: 33 U/L (ref 0–37)
Albumin: 4.3 g/dL (ref 3.5–5.2)
Alkaline Phosphatase: 101 U/L (ref 39–117)
BUN: 15 mg/dL (ref 6–23)
CO2: 24 mEq/L (ref 19–32)
Calcium: 9.2 mg/dL (ref 8.4–10.5)
Chloride: 108 mEq/L (ref 96–112)
Creatinine, Ser: 0.98 mg/dL (ref 0.40–1.50)
GFR: 100.86 mL/min (ref >60.00)
Glucose, Bld: 80 mg/dL (ref 70–99)
Potassium: 4.5 mEq/L (ref 3.5–5.1)
Sodium: 141 mEq/L (ref 135–145)
Total Bilirubin: 0.7 mg/dL (ref 0.2–1.2)
Total Protein: 7.4 g/dL (ref 6.0–8.3)

## 2021-07-28 LAB — CBC WITH DIFFERENTIAL/PLATELET
Basophils Absolute: 0 10*3/uL (ref 0.0–0.1)
Basophils Relative: 0.6 % (ref 0.0–3.0)
Eosinophils Absolute: 0.1 10*3/uL (ref 0.0–0.7)
Eosinophils Relative: 2.7 % (ref 0.0–5.0)
HCT: 40.8 % (ref 39.0–52.0)
Hemoglobin: 12.9 g/dL — ABNORMAL LOW (ref 13.0–17.0)
Lymphocytes Relative: 48.8 % — ABNORMAL HIGH (ref 12.0–46.0)
Lymphs Abs: 2.1 10*3/uL (ref 0.7–4.0)
MCHC: 31.6 g/dL (ref 30.0–36.0)
MCV: 90.6 fl (ref 78.0–100.0)
Monocytes Absolute: 0.3 10*3/uL (ref 0.1–1.0)
Monocytes Relative: 6.5 % (ref 3.0–12.0)
Neutro Abs: 1.8 10*3/uL (ref 1.4–7.7)
Neutrophils Relative %: 41.4 % — ABNORMAL LOW (ref 43.0–77.0)
Platelets: 164 10*3/uL (ref 150.0–400.0)
RBC: 4.5 Mil/uL (ref 4.22–5.81)
RDW: 15.4 % (ref 11.5–15.5)
WBC: 4.3 10*3/uL (ref 4.0–10.5)

## 2021-07-28 LAB — LIPID PANEL
Cholesterol: 103 mg/dL (ref 0–200)
HDL: 50.3 mg/dL (ref >39.00)
LDL Cholesterol: 41 mg/dL (ref 0–99)
NonHDL: 52.98
Total CHOL/HDL Ratio: 2
Triglycerides: 61 mg/dL (ref 0.0–149.0)
VLDL: 12.2 mg/dL (ref 0.0–40.0)

## 2021-07-28 LAB — TSH: TSH: 1.72 u[IU]/mL (ref 0.35–5.50)

## 2021-07-28 MED ORDER — FLUTICASONE PROPIONATE 50 MCG/ACT NA SUSP
NASAL | 12 refills | Status: DC
  Filled 2021-07-28: qty 16, 30d supply, fill #0
  Filled 2022-03-07: qty 16, 30d supply, fill #1

## 2021-07-29 ENCOUNTER — Encounter: Payer: No Typology Code available for payment source | Admitting: Internal Medicine

## 2021-07-29 LAB — URINALYSIS, ROUTINE W REFLEX MICROSCOPIC
Bilirubin Urine: NEGATIVE
Glucose, UA: NEGATIVE
Hgb urine dipstick: NEGATIVE
Ketones, ur: NEGATIVE
Leukocytes,Ua: NEGATIVE
Nitrite: NEGATIVE
Protein, ur: NEGATIVE
Specific Gravity, Urine: 1.031 (ref 1.001–1.035)
pH: 6 (ref 5.0–8.0)

## 2021-07-29 LAB — VITAMIN D 25 HYDROXY (VIT D DEFICIENCY, FRACTURES): Vit D, 25-Hydroxy: 30 ng/mL (ref 30–100)

## 2021-07-30 ENCOUNTER — Encounter: Payer: Self-pay | Admitting: Internal Medicine

## 2021-07-30 DIAGNOSIS — D709 Neutropenia, unspecified: Secondary | ICD-10-CM | POA: Insufficient documentation

## 2021-08-10 ENCOUNTER — Telehealth: Payer: Self-pay

## 2021-08-10 NOTE — Telephone Encounter (Signed)
Pt lmovm requesting to move procedure to a Friday

## 2021-08-11 NOTE — Telephone Encounter (Signed)
Procedure rescheduled for 09/03/21 in [place] ? ?New PPW released to mychart ?

## 2021-08-30 ENCOUNTER — Encounter: Payer: Self-pay | Admitting: Gastroenterology

## 2021-09-03 ENCOUNTER — Ambulatory Visit
Admission: RE | Admit: 2021-09-03 | Discharge: 2021-09-03 | Disposition: A | Discharge status: Home / Self Care | Payer: No Typology Code available for payment source | Attending: Gastroenterology | Admitting: Gastroenterology

## 2021-09-03 ENCOUNTER — Encounter
Admission: RE | Disposition: A | Discharge status: Home / Self Care | Payer: Self-pay | Source: Home / Self Care | Attending: Gastroenterology

## 2021-09-03 ENCOUNTER — Ambulatory Visit: Payer: No Typology Code available for payment source | Admitting: Anesthesiology

## 2021-09-03 ENCOUNTER — Other Ambulatory Visit: Payer: Self-pay

## 2021-09-03 ENCOUNTER — Encounter: Payer: Self-pay | Admitting: Gastroenterology

## 2021-09-03 DIAGNOSIS — K449 Diaphragmatic hernia without obstruction or gangrene: Secondary | ICD-10-CM | POA: Diagnosis not present

## 2021-09-03 DIAGNOSIS — D509 Iron deficiency anemia, unspecified: Secondary | ICD-10-CM | POA: Diagnosis present

## 2021-09-03 DIAGNOSIS — K2970 Gastritis, unspecified, without bleeding: Secondary | ICD-10-CM

## 2021-09-03 DIAGNOSIS — Z9884 Bariatric surgery status: Secondary | ICD-10-CM | POA: Diagnosis not present

## 2021-09-03 DIAGNOSIS — K648 Other hemorrhoids: Secondary | ICD-10-CM | POA: Diagnosis not present

## 2021-09-03 DIAGNOSIS — I10 Essential (primary) hypertension: Secondary | ICD-10-CM | POA: Diagnosis not present

## 2021-09-03 DIAGNOSIS — K319 Disease of stomach and duodenum, unspecified: Secondary | ICD-10-CM | POA: Diagnosis not present

## 2021-09-03 HISTORY — PX: ESOPHAGOGASTRODUODENOSCOPY (EGD) WITH PROPOFOL: SHX5813

## 2021-09-03 HISTORY — PX: COLONOSCOPY WITH PROPOFOL: SHX5780

## 2021-09-03 SURGERY — ESOPHAGOGASTRODUODENOSCOPY (EGD) WITH PROPOFOL
Anesthesia: General | Site: Rectum

## 2021-09-03 MED ORDER — SODIUM CHLORIDE 0.9 % IV SOLN: INTRAVENOUS | Status: DC

## 2021-09-03 MED ORDER — ACETAMINOPHEN 160 MG/5ML PO SOLN: 325.0000 mg | ORAL | Status: DC | PRN

## 2021-09-03 MED ORDER — LIDOCAINE HCL (CARDIAC) PF 100 MG/5ML IV SOSY
PREFILLED_SYRINGE | INTRAVENOUS | Status: DC | PRN
Start: 2021-09-03 — End: 2021-09-03
  Administered 2021-09-03: 30 mg via INTRAVENOUS

## 2021-09-03 MED ORDER — ONDANSETRON HCL 4 MG/2ML IJ SOLN: 4.0000 mg | Freq: Once | INTRAMUSCULAR | Status: DC | PRN

## 2021-09-03 MED ORDER — LACTATED RINGERS IV SOLN: INTRAVENOUS | Status: DC

## 2021-09-03 MED ORDER — ACETAMINOPHEN 325 MG PO TABS: 325.0000 mg | ORAL_TABLET | ORAL | Status: DC | PRN

## 2021-09-03 MED ORDER — STERILE WATER FOR IRRIGATION IR SOLN
Status: DC | PRN
  Administered 2021-09-03: 25 mL

## 2021-09-03 MED ORDER — PROPOFOL 10 MG/ML IV BOLUS
INTRAVENOUS | Status: DC | PRN
  Administered 2021-09-03: 30 mg via INTRAVENOUS
  Administered 2021-09-03: 150 mg via INTRAVENOUS
  Administered 2021-09-03 (×14): 30 mg via INTRAVENOUS

## 2021-09-03 MED ORDER — GLYCOPYRROLATE 0.2 MG/ML IJ SOLN
INTRAMUSCULAR | Status: DC | PRN
  Administered 2021-09-03: .1 mg via INTRAVENOUS

## 2021-09-03 SURGICAL SUPPLY — 8 items
BLOCK BITE 60FR ADLT L/F GRN (MISCELLANEOUS) ×3 IMPLANT
FORCEPS BIOP RAD 4 LRG CAP 4 (CUTTING FORCEPS) ×1 IMPLANT
GOWN CVR UNV OPN BCK APRN NK (MISCELLANEOUS) ×4 IMPLANT
GOWN ISOL THUMB LOOP REG UNIV (MISCELLANEOUS) ×6
KIT PRC NS LF DISP ENDO (KITS) ×2 IMPLANT
KIT PROCEDURE OLYMPUS (KITS) ×3
MANIFOLD NEPTUNE II (INSTRUMENTS) ×3 IMPLANT
WATER STERILE IRR 250ML POUR (IV SOLUTION) ×3 IMPLANT

## 2021-09-03 NOTE — Anesthesia Preprocedure Evaluation (Signed)
Anesthesia Evaluation  ?Patient identified by MRN, date of birth, ID band ?Patient awake ? ? ? ?Reviewed: ?Allergy & Precautions, H&P , NPO status , Patient's Chart, lab work & pertinent test results ? ?Airway ?Mallampati: I ? ?TM Distance: >3 FB ?Neck ROM: full ? ? ? Dental ?no notable dental hx. ? ?  ?Pulmonary ?sleep apnea ,  ?  ?Pulmonary exam normal ? ? ? ? ? ? ? Cardiovascular ?hypertension, On Medications ?Normal cardiovascular exam ?Rhythm:regular Rate:Normal ? ? ?  ?Neuro/Psych ?negative neurological ROS ? negative psych ROS  ? GI/Hepatic ?Neg liver ROS, Medicated,  ?Endo/Other  ?negative endocrine ROS ? Renal/GU ?negative Renal ROS  ?negative genitourinary ?  ?Musculoskeletal ? ? Abdominal ?  ?Peds ? Hematology ?negative hematology ROS ?(+)   ?Anesthesia Other Findings ? ? Reproductive/Obstetrics ? ?  ? ? ? ? ? ? ? ? ? ? ? ? ? ?  ?  ? ? ? ? ? ? ? ? ?Anesthesia Physical ?Anesthesia Plan ? ?ASA: 2 ? ?Anesthesia Plan: General  ? ?Post-op Pain Management:   ? ?Induction:  ? ?PONV Risk Score and Plan: 2 and TIVA and Propofol infusion ? ?Airway Management Planned:  ? ?Additional Equipment:  ? ?Intra-op Plan:  ? ?Post-operative Plan:  ? ?Informed Consent: I have reviewed the patients History and Physical, chart, labs and discussed the procedure including the risks, benefits and alternatives for the proposed anesthesia with the patient or authorized representative who has indicated his/her understanding and acceptance.  ? ? ? ? ? ?Plan Discussed with:  ? ?Anesthesia Plan Comments:   ? ? ? ? ? ? ?Anesthesia Quick Evaluation ? ?

## 2021-09-03 NOTE — Op Note (Signed)
[facility] ?Gastroenterology ?Patient Name: Thomas Shaw ?Procedure Date: 09/03/2021 10:43 AM ?MRN: [id number] ?Account #: [id number] ?Date of Birth: 07/21/1985 ?Admit Type: Outpatient ?Age: 36 ?Room: [facility] OR ROOM 01 ?Gender: Male ?Note Status: Finalized ?Instrument Name: [id number] ?Procedure:             Upper GI endoscopy ?Indications:           Iron deficiency anemia ?Providers:             Lucilla Lame MD, MD ?Referring MD:          [name] MD, MD (Referring MD) ?Medicines:             Propofol per Anesthesia ?Complications:         No immediate complications. ?Procedure:             Pre-Anesthesia Assessment: ?                       - Prior to the procedure, a History and Physical was  ?                       performed, and patient medications and allergies were  ?                       reviewed. The patient's tolerance of previous  ?                       anesthesia was also reviewed. The risks and benefits  ?                       of the procedure and the sedation options and risks  ?                       were discussed with the patient. All questions were  ?                       answered, and informed consent was obtained. Prior  ?                       Anticoagulants: The patient has taken no previous  ?                       anticoagulant or antiplatelet agents. ASA Grade  ?                       Assessment: II - A patient with mild systemic disease.  ?                       After reviewing the risks and benefits, the patient  ?                       was deemed in satisfactory condition to undergo the  ?                       procedure. ?                       After obtaining informed consent, the endoscope was  ?  passed under direct vision. Throughout the procedure,  ?                       the patient's blood pressure, pulse, and oxygen  ?                       saturations were monitored continuously. The Endoscope  ?                       was  introduced through the mouth, and advanced to the  ?                       jejunum. The upper GI endoscopy was accomplished  ?                       without difficulty. The patient tolerated the  ?                       procedure well. ?Findings: ?     A medium-sized hiatal hernia was present. ?     Evidence of a sleeve gastrectomy was found in the entire examined  ?     stomach. ?     Localized moderate inflammation characterized by erosions was found in  ?     the gastric antrum. Biopsies were taken with a cold forceps for  ?     histology. ?     There was evidence of a patent previous surgical anastomosis in the  ?     afferent jejunal loop and in the efferent jejunal loop. This was  ?     characterized by healthy appearing mucosa. Biopsies were taken with a  ?     cold forceps for histology. ?Impression:            - Medium-sized hiatal hernia. ?                       - A sleeve gastrectomy was found. ?                       - Gastritis. Biopsied. ?                       - Patent previous surgical anastomosis, characterized  ?                       by healthy appearing mucosa was found in the jejunum.  ?                       Biopsied. ?Recommendation:        - Discharge patient to home. ?                       - Resume previous diet. ?                       - Continue present medications. ?                       - Perform a colonoscopy today. ?Procedure Code(s):     --- Professional --- ?  65993, Esophagogastroduodenoscopy, flexible,  ?                       transoral; with biopsy, single or multiple ?Diagnosis Code(s):     --- Professional --- ?                       D50.9, Iron deficiency anemia, unspecified ?                       K29.70, Gastritis, unspecified, without bleeding ?CPT copyright 2019 American Medical Association. All rights reserved. ?The codes documented in this report are preliminary and upon coder review may  ?be revised to meet current compliance requirements. ?Lucilla Lame MD, MD ?09/03/2021 10:57:31 AM ?This report has been signed electronically. ?Number of Addenda: 0 ?Note Initiated On: 09/03/2021 10:43 AM ?Total Procedure Duration: 0 hours 3 minutes 42 seconds  ?Estimated Blood Loss:  Estimated blood loss: none. ?     [facility] ?

## 2021-09-03 NOTE — Op Note (Signed)
[facility] ?Gastroenterology ?Patient Name: Thomas Shaw ?Procedure Date: 09/03/2021 10:41 AM ?MRN: [id number] ?Account #: [id number] ?Date of Birth: 09-27-1985 ?Admit Type: Outpatient ?Age: 36 ?Room: [facility] OR ROOM 01 ?Gender: Male ?Note Status: Finalized ?Instrument Name: [id number] ?Procedure:             Colonoscopy ?Indications:           Iron deficiency anemia ?Providers:             Lucilla Lame MD, MD ?Referring MD:          [name] MD, MD (Referring MD) ?Medicines:             Propofol per Anesthesia ?Complications:         No immediate complications. ?Procedure:             Pre-Anesthesia Assessment: ?                       - Prior to the procedure, a History and Physical was  ?                       performed, and patient medications and allergies were  ?                       reviewed. The patient's tolerance of previous  ?                       anesthesia was also reviewed. The risks and benefits  ?                       of the procedure and the sedation options and risks  ?                       were discussed with the patient. All questions were  ?                       answered, and informed consent was obtained. Prior  ?                       Anticoagulants: The patient has taken no previous  ?                       anticoagulant or antiplatelet agents. ASA Grade  ?                       Assessment: II - A patient with mild systemic disease.  ?                       After reviewing the risks and benefits, the patient  ?                       was deemed in satisfactory condition to undergo the  ?                       procedure. ?                       After obtaining informed consent, the colonoscope was  ?  passed under direct vision. Throughout the procedure,  ?                       the patient's blood pressure, pulse, and oxygen  ?                       saturations were monitored continuously. The  ?                       Colonoscope was  introduced through the anus and  ?                       advanced to the the cecum, identified by appendiceal  ?                       orifice and ileocecal valve. The colonoscopy was  ?                       performed without difficulty. The patient tolerated  ?                       the procedure well. The quality of the bowel  ?                       preparation was adequate to identify polyps. ?Findings: ?     The perianal and digital rectal examinations were normal. ?     Non-bleeding internal hemorrhoids were found during retroflexion. The  ?     hemorrhoids were Grade II (internal hemorrhoids that prolapse but reduce  ?     spontaneously). ?Impression:            - Non-bleeding internal hemorrhoids. ?                       - No specimens collected. ?Recommendation:        - Discharge patient to home. ?                       - Resume previous diet. ?                       - Continue present medications. ?                       - Repeat colonoscopy in 10 years for screening  ?                       purposes. ?Procedure Code(s):     --- Professional --- ?                       [phone], Colonoscopy, flexible; diagnostic, including  ?                       collection of specimen(s) by brushing or washing, when  ?                       performed (separate procedure) ?Diagnosis Code(s):     --- Professional --- ?                       D50.9, Iron  deficiency anemia, unspecified ?CPT copyright 2019 American Medical Association. All rights reserved. ?The codes documented in this report are preliminary and upon coder review may  ?be revised to meet current compliance requirements. ?Lucilla Lame MD, MD ?09/03/2021 11:21:44 AM ?This report has been signed electronically. ?Number of Addenda: 0 ?Note Initiated On: 09/03/2021 10:41 AM ?Scope Withdrawal Time: 0 hours 9 minutes 15 seconds  ?Total Procedure Duration: 0 hours 20 minutes 17 seconds  ?Estimated Blood Loss:  Estimated blood loss: none. ?     [facility] ?

## 2021-09-03 NOTE — Anesthesia Postprocedure Evaluation (Signed)
Anesthesia Post Note ? ?Patient: Thomas Shaw ? ?Procedure(s) Performed: ESOPHAGOGASTRODUODENOSCOPY (EGD) WITH BIOPSY (Mouth) ?COLONOSCOPY WITH PROPOFOL (Rectum) ? ? ?  ?Patient location during evaluation: PACU ?Anesthesia Type: General ?Level of consciousness: awake and alert ?Pain management: pain level controlled ?Vital Signs Assessment: post-procedure vital signs reviewed and stable ?Respiratory status: spontaneous breathing ?Cardiovascular status: stable ?Anesthetic complications: no ? ? ?No notable events documented. ? ?Virl Axe,  Kimyata Milich D ? ? ? ? ? ?

## 2021-09-03 NOTE — Transfer of Care (Signed)
Immediate Anesthesia Transfer of Care Note ? ?Patient: Thomas Shaw ? ?Procedure(s) Performed: ESOPHAGOGASTRODUODENOSCOPY (EGD) WITH BIOPSY (Mouth) ?COLONOSCOPY WITH PROPOFOL (Rectum) ? ?Patient Location: PACU ? ?Anesthesia Type: General ? ?Level of Consciousness: awake, alert  and patient cooperative ? ?Airway and Oxygen Therapy: Patient Spontanous Breathing and Patient connected to supplemental oxygen ? ?Post-op Assessment: Post-op Vital signs reviewed, Patient's Cardiovascular Status Stable, Respiratory Function Stable, Patent Airway and No signs of Nausea or vomiting ? ?Post-op Vital Signs: Reviewed and stable ? ?Complications: No notable events documented. ? ?

## 2021-09-03 NOTE — Anesthesia Procedure Notes (Signed)
Date/Time: 09/03/2021 10:45 AM ?Performed by: Cameron Ali, CRNA ?Pre-anesthesia Checklist: Patient identified, Emergency Drugs available, Suction available, Timeout performed and Patient being monitored ?Patient Re-evaluated:Patient Re-evaluated prior to induction ?Oxygen Delivery Method: Nasal cannula ?Placement Confirmation: positive ETCO2 ? ? ? ? ?

## 2021-09-03 NOTE — H&P (Signed)
? ?Lucilla Lame, MD [facility] ?[address] ?[place], [place] [address] ?Phone:[phone] ?Fax : [phone] ? ?Primary Care Physician:  McLean-Scocuzza, Nino Glow, MD ?Primary Gastroenterologist:  Dr. Allen Norris ? ?Pre-Procedure History & Physical: ?HPI:  Zymiere Trostle is a 36 y.o. male is here for an endoscopy and colonoscopy. ?  ?Past Medical History:  ?Diagnosis Date  ? Anemia, unspecified 02/22/2016  ? iron deficient  ? Blood in stool   ? COVID-19   ? 05/2020 xmas  ? Diarrhea   ? Essential hypertension 12/30/2016  ? Exocrine pancreatic insufficiency 10/15/2014  ? Gallstones   ? Gastro-esophageal reflux 12/30/2016  ? Inguinal hernia   ? Leukopenia   ? Sleep apnea   ? hx of befor weight loss surgery none now  ? Vasovagal syncope   ? Vitamin D deficiency 12/30/2016  ? ? ?Past Surgical History:  ?Procedure Laterality Date  ? BARIATRIC SURGERY  07/31/2013  ? sleeve gastrectomy with duodenal switch  ? CHOLECYSTECTOMY N/A 01/04/2019  ? Procedure: LAPAROSCOPIC CHOLECYSTECTOMY with intraoperative cholangiogram;  Surgeon: Johnathan Hausen, MD;  Location: [facility];  Service: General;  Laterality: N/A;  ? INGUINAL HERNIA REPAIR Right 01/04/2019  ? Procedure: OPEN RIGHT INGUINAL HERNIA REPAIR;  Surgeon: Johnathan Hausen, MD;  Location: [facility];  Service: General;  Laterality: Right;  ? ? ?Prior to Admission medications   ?Medication Sig Start Date End Date Taking? Authorizing Provider  ?Cholecalciferol (D3-50) 1.25 MG (50000 UT) capsule Take 1 capsule (50,000 Units total) by mouth 3 (three) times a week. 04/28/21 04/28/22 Yes McLean-Scocuzza, Nino Glow, MD  ?Ferrous Sulfate (IRON PO) Take by mouth.   Yes [provider]  ?FIBER ADULT GUMMIES PO Take 1 Dose by mouth daily.   Yes [provider]  ?fluticasone (FLONASE) 50 MCG/ACT nasal spray PLACE 2 SPRAYS INTO BOTH NOSTRILS DAILY AS NEEDED FOR ALLERGIES OR RHINITIS. 07/28/21 07/28/22 Yes McLean-Scocuzza, Nino Glow, MD  ?Multiple Vitamin (MULTI-VITAMINS) TABS Take 1 tablet by  mouth daily.    Yes [provider]  ?EPINEPHrine 0.3 mg/0.3 mL IJ SOAJ injection Inject 0.3 mLs (0.3 mg total) into the muscle once. Follow package instructions as needed for severe allergy or anaphylactic reaction. 12/08/15   Carrie Mew, MD  ? ? ?Allergies as of 07/22/2021  ? (No Known Allergies)  ? ? ?Family History  ?Problem Relation Age of Onset  ? Diabetes Mother   ? Hypertension Mother   ? Pancreatic disease Father   ? Diabetes Father   ? Hypertension Father   ? Other Father   ?     wt loss ? cause cancer had wt loss  ? Hypertension Brother   ?     ?  ? Prostate cancer Neg Hx   ? Kidney cancer Neg Hx   ? Bladder Cancer Neg Hx   ? ? ?Social History  ? ?Socioeconomic History  ? Marital status: Married  ?  Spouse name: Not on file  ? Number of children: Not on file  ? Years of education: Not on file  ? Highest education level: Not on file  ?Occupational History  ? Not on file  ?Tobacco Use  ? Smoking status: Never  ? Smokeless tobacco: Never  ?Vaping Use  ? Vaping Use: Never used  ?Substance and Sexual Activity  ? Alcohol use: Yes  ?  Comment: rarely  ? Drug use: No  ? Sexual activity: Yes  ?Other Topics Concern  ? Not on file  ?Social History Narrative  ? Married   ?  Works in [place] patient is a 36 year old African-American male with a past medical history significant for sleeve gastrectomy with duodenal switch back in 2015.  Other medical problems include hypertension, GERD  ? No guns   ? Wears seat belt   ? Safe in relationship   ? [facility] and [employer] [facility]  ? Baby of 3 kids  ? ?Social Determinants of Health  ? ?Financial Resource Strain: Not on file  ?Food Insecurity: Not on file  ?Transportation Needs: Not on file  ?Physical Activity: Not on file  ?Stress: Not on file  ?Social Connections: Not on file  ?Intimate Partner Violence: Not on file  ? ? ?Review of Systems: ?See HPI, otherwise negative ROS ? ?Physical Exam: ?BP 133/81   Pulse 100   Temp 98.1 ?F (36.7 ?C) (Temporal)   Resp 20    Ht '5\' 11"'$  (1.803 m)   Wt 88.9 kg   SpO2 100%   BMI 27.34 kg/m?  ?General:   Alert,  pleasant and cooperative in NAD ?Head:  Normocephalic and atraumatic. ?Neck:  Supple; no masses or thyromegaly. ?Lungs:  Clear throughout to auscultation.    ?Heart:  Regular rate and rhythm. ?Abdomen:  Soft, nontender and nondistended. Normal bowel sounds, without guarding, and without rebound.   ?Neurologic:  Alert and  oriented x4;  grossly normal neurologically. ? ?Impression/Plan: ?Issa Kosmicki is here for an endoscopy and colonoscopy to be performed for IDA ? ?Risks, benefits, limitations, and alternatives regarding  endoscopy and colonoscopy have been reviewed with the patient.  Questions have been answered.  All parties agreeable. ? ? ?Lucilla Lame, MD  09/03/2021, 10:37 AM ?

## 2021-09-06 LAB — SURGICAL PATHOLOGY

## 2021-09-07 ENCOUNTER — Encounter: Payer: Self-pay | Admitting: Gastroenterology

## 2021-09-16 ENCOUNTER — Telehealth: Payer: Self-pay

## 2021-09-16 NOTE — Telephone Encounter (Signed)
[facility] is requesting colonoscopy, EGD and Path report to be faxed to them at [phone]. Faxed this to them  ?

## 2021-10-08 ENCOUNTER — Encounter: Payer: Self-pay | Admitting: Oncology

## 2021-10-08 ENCOUNTER — Other Ambulatory Visit: Payer: Self-pay

## 2021-10-11 ENCOUNTER — Encounter: Payer: Self-pay | Admitting: Nurse Practitioner

## 2021-10-11 ENCOUNTER — Inpatient Hospital Stay: Payer: No Typology Code available for payment source | Admitting: Nurse Practitioner

## 2021-10-11 ENCOUNTER — Inpatient Hospital Stay: Payer: No Typology Code available for payment source | Attending: Nurse Practitioner

## 2021-10-11 VITALS — BP 118/65 | HR 78 | Temp 97.9°F | Resp 14 | Wt 204.0 lb

## 2021-10-11 DIAGNOSIS — D708 Other neutropenia: Secondary | ICD-10-CM | POA: Diagnosis not present

## 2021-10-11 DIAGNOSIS — Z8616 Personal history of COVID-19: Secondary | ICD-10-CM | POA: Diagnosis not present

## 2021-10-11 DIAGNOSIS — D539 Nutritional anemia, unspecified: Secondary | ICD-10-CM | POA: Insufficient documentation

## 2021-10-11 DIAGNOSIS — E559 Vitamin D deficiency, unspecified: Secondary | ICD-10-CM | POA: Insufficient documentation

## 2021-10-11 DIAGNOSIS — E610 Copper deficiency: Secondary | ICD-10-CM | POA: Insufficient documentation

## 2021-10-11 DIAGNOSIS — D709 Neutropenia, unspecified: Secondary | ICD-10-CM | POA: Diagnosis present

## 2021-10-11 DIAGNOSIS — D509 Iron deficiency anemia, unspecified: Secondary | ICD-10-CM

## 2021-10-11 DIAGNOSIS — G4730 Sleep apnea, unspecified: Secondary | ICD-10-CM | POA: Insufficient documentation

## 2021-10-11 DIAGNOSIS — D649 Anemia, unspecified: Secondary | ICD-10-CM

## 2021-10-11 DIAGNOSIS — I10 Essential (primary) hypertension: Secondary | ICD-10-CM | POA: Diagnosis not present

## 2021-10-11 DIAGNOSIS — D508 Other iron deficiency anemias: Secondary | ICD-10-CM

## 2021-10-11 DIAGNOSIS — K219 Gastro-esophageal reflux disease without esophagitis: Secondary | ICD-10-CM | POA: Insufficient documentation

## 2021-10-11 LAB — COMPREHENSIVE METABOLIC PANEL
ALT: 32 U/L (ref 0–44)
AST: 29 U/L (ref 15–41)
Albumin: 3.8 g/dL (ref 3.5–5.0)
Alkaline Phosphatase: 79 U/L (ref 38–126)
Anion gap: 3 — ABNORMAL LOW (ref 5–15)
BUN: 14 mg/dL (ref 6–20)
CO2: 25 mmol/L (ref 22–32)
Calcium: 8.5 mg/dL — ABNORMAL LOW (ref 8.9–10.3)
Chloride: 110 mmol/L (ref 98–111)
Creatinine, Ser: 1.21 mg/dL (ref 0.61–1.24)
GFR, Estimated: >60 mL/min (ref >60)
Glucose, Bld: 95 mg/dL (ref 70–99)
Potassium: 4.2 mmol/L (ref 3.5–5.1)
Sodium: 138 mmol/L (ref 135–145)
Total Bilirubin: 0.9 mg/dL (ref 0.3–1.2)
Total Protein: 6.5 g/dL (ref 6.5–8.1)

## 2021-10-11 LAB — CBC WITH DIFFERENTIAL/PLATELET
Abs Immature Granulocytes: 0.01 10*3/uL (ref 0.00–0.07)
Basophils Absolute: 0 10*3/uL (ref 0.0–0.1)
Basophils Relative: 1 %
Eosinophils Absolute: 0.1 10*3/uL (ref 0.0–0.5)
Eosinophils Relative: 1 %
HCT: 34.6 % — ABNORMAL LOW (ref 39.0–52.0)
Hemoglobin: 11.2 g/dL — ABNORMAL LOW (ref 13.0–17.0)
Immature Granulocytes: 0 %
Lymphocytes Relative: 51 %
Lymphs Abs: 2.1 10*3/uL (ref 0.7–4.0)
MCH: 28.4 pg (ref 26.0–34.0)
MCHC: 32.4 g/dL (ref 30.0–36.0)
MCV: 87.8 fL (ref 80.0–100.0)
Monocytes Absolute: 0.3 10*3/uL (ref 0.1–1.0)
Monocytes Relative: 7 %
Neutro Abs: 1.6 10*3/uL — ABNORMAL LOW (ref 1.7–7.7)
Neutrophils Relative %: 40 %
Platelets: 170 10*3/uL (ref 150–400)
RBC: 3.94 MIL/uL — ABNORMAL LOW (ref 4.22–5.81)
RDW: 14.2 % (ref 11.5–15.5)
WBC: 4 10*3/uL (ref 4.0–10.5)
nRBC: 0 % (ref 0.0–0.2)

## 2021-10-11 LAB — VITAMIN B12: Vitamin B-12: 907 pg/mL (ref 180–914)

## 2021-10-11 LAB — FERRITIN: Ferritin: 112 ng/mL (ref 24–336)

## 2021-10-11 LAB — IRON AND TIBC
Iron: 115 ug/dL (ref 45–182)
Saturation Ratios: 33 % (ref 17.9–39.5)
TIBC: 347 ug/dL (ref 250–450)
UIBC: 232 ug/dL

## 2021-10-11 NOTE — Progress Notes (Signed)
Pt in for follow up, denies any concerns today. 

## 2021-10-11 NOTE — Progress Notes (Signed)
? ?Hematology/Oncology Consult Note ?[place]  ?[phone] [phone] ? ?Patient Care Team: ?McLean-Scocuzza, Nino Glow, MD as PCP - General (Internal Medicine) ?Sindy Guadeloupe, MD as Consulting Physician (Hematology and Oncology)  ? ?Name of the patient: Thomas Shaw  ?[id number]  ?Sep 14, 1985  ? ?Date of visit: 10/11/21 ? ?Diagnosis- mild neutropenia possibly benign ethnic versus secondary to copper deficiency ?Normocytic anemia ? ?Chief complaint/ Reason for visit-routine follow-up of neutropenia and anemia ? ?Heme/Onc history: patient is a 36 year old African-American male with a history of sleeve gastrectomy with duodenal switch in 2015.  Other medical problems include hypertension and GERD.  He has been referred to [place] for neutropenia.  Most recent CBC from 04/13/2020 showed white count of 3.7, H&H of 11.2/34.2 with a platelet count of 169.  Differential showed mild neutropenia with an ANC of 1.3 with relative lymphocytosis but absolute lymphocyte count was normal.  Looking back at his prior CBCs his white count does wax and wane between 2.8-14 and he has had mild intermittent neutropenia in the past.  He denies any recurrent infections or hospitalizations.  Denies any unintentional weight loss.  Denies any recent change in his medications.  Patient has also had mild chronic anemia and his hemoglobin typically runs around 11. ?  ?Results of blood work from 04/28/2020 were as follows: CBC showed white count of 4.4, H&H of 11.5/34.9 with an MCV of 87 and a platelet count of 199.  ANC was 1.5.  CMP, folate, reticulocyte count, TSH was within normal limits.  HIV and hepatitis C were negative.  B1 B6 was normal.  Myeloma panel showed no M protein.  Haptoglobin was less than 10.  Smear review was unremarkable.  Copper levels were low at 12. ? ?Interval history- Patient is 36 year old male with above history of neutropenia and anemia who returns to clinic for repeat labs and  follow up. He Has seen GI in the interim. Reports continuing to take iron and copper supplements. Denies leg cramps. Overall feels well and denies complaints.  ? ?ECOG PS- 0 ?Pain scale- 0 ? ?Review of systems- Review of Systems  ?Constitutional:  Negative for chills, fever, malaise/fatigue and weight loss.  ?HENT:  Negative for congestion, ear discharge and nosebleeds.   ?Eyes:  Negative for blurred vision.  ?Respiratory:  Negative for cough, hemoptysis, sputum production, shortness of breath and wheezing.   ?Cardiovascular:  Negative for chest pain, palpitations, orthopnea and claudication.  ?Gastrointestinal:  Negative for abdominal pain, blood in stool, constipation, diarrhea, heartburn, melena, nausea and vomiting.  ?Genitourinary:  Negative for dysuria, flank pain, frequency, hematuria and urgency.  ?Musculoskeletal:  Negative for back pain, joint pain and myalgias.  ?Skin:  Negative for rash.  ?Neurological:  Negative for dizziness, tingling, focal weakness, seizures, weakness and headaches.  ?Endo/Heme/Allergies:  Does not bruise/bleed easily.  ?Psychiatric/Behavioral:  Negative for depression and suicidal ideas. The patient does not have insomnia.    ? ?No Known Allergies ? ?Past Medical History:  ?Diagnosis Date  ? Anemia, unspecified 02/22/2016  ? iron deficient  ? Blood in stool   ? COVID-19   ? 05/2020 xmas  ? Diarrhea   ? Essential hypertension 12/30/2016  ? Exocrine pancreatic insufficiency 10/15/2014  ? Gallstones   ? Gastro-esophageal reflux 12/30/2016  ? Inguinal hernia   ? Leukopenia   ? Sleep apnea   ? hx of befor weight loss surgery none now  ? Vasovagal syncope   ? Vitamin D deficiency 12/30/2016  ? ?  Past Surgical History:  ?Procedure Laterality Date  ? BARIATRIC SURGERY  07/31/2013  ? sleeve gastrectomy with duodenal switch  ? CHOLECYSTECTOMY N/A 01/04/2019  ? Procedure: LAPAROSCOPIC CHOLECYSTECTOMY with intraoperative cholangiogram;  Surgeon: Johnathan Hausen, MD;  Location: [facility];  Service:  General;  Laterality: N/A;  ? COLONOSCOPY WITH PROPOFOL N/A 09/03/2021  ? Procedure: COLONOSCOPY WITH PROPOFOL;  Surgeon: Lucilla Lame, MD;  Location: [place];  Service: Endoscopy;  Laterality: N/A;  ? ESOPHAGOGASTRODUODENOSCOPY (EGD) WITH PROPOFOL N/A 09/03/2021  ? Procedure: ESOPHAGOGASTRODUODENOSCOPY (EGD) WITH BIOPSY;  Surgeon: Lucilla Lame, MD;  Location: [place];  Service: Endoscopy;  Laterality: N/A;  ? INGUINAL HERNIA REPAIR Right 01/04/2019  ? Procedure: OPEN RIGHT INGUINAL HERNIA REPAIR;  Surgeon: Johnathan Hausen, MD;  [facility];  Service: General;  Laterality: Right;  ? ?Social History  ? ?Socioeconomic History  ? Marital status: Married  ?  Spouse name: Not on file  ? Number of children: Not on file  ? Years of education: Not on file  ? Highest education level: Not on file  ?Occupational History  ? Not on file  ?Tobacco Use  ? Smoking status: Never  ? Smokeless tobacco: Never  ?Vaping Use  ? Vaping Use: Never used  ?Substance and Sexual Activity  ? Alcohol use: Yes  ?  Comment: rarely  ? Drug use: No  ? Sexual activity: Yes  ?Other Topics Concern  ? Not on file  ?Social History Narrative  ? Married   ? Works in [place] patient is a 36 year old African-American male with a past medical history significant for sleeve gastrectomy with duodenal switch back in 2015.  Other medical problems include hypertension, GERD  ? No guns   ? Wears seat belt   ? Safe in relationship   ? [facility] [facility]  ? Baby of 3 kids  ? ?Social Determinants of Health  ? ?Financial Resource Strain: Not on file  ?Food Insecurity: Not on file  ?Transportation Needs: Not on file  ?Physical Activity: Not on file  ?Stress: Not on file  ?Social Connections: Not on file  ?Intimate Partner Violence: Not on file  ? ? ?Family History  ?Problem Relation Age of Onset  ? Diabetes Mother   ? Hypertension Mother   ? Pancreatic disease Father   ? Diabetes Father   ? Hypertension Father   ? Other Father   ?     wt  loss ? cause cancer had wt loss  ? Hypertension Brother   ?     ?  ? Prostate cancer Neg Hx   ? Kidney cancer Neg Hx   ? Bladder Cancer Neg Hx   ? ? ? ?Current Outpatient Medications:  ?  Cholecalciferol (D3-50) 1.25 MG (50000 UT) capsule, Take 1 capsule (50,000 Units total) by mouth 3 (three) times a week., Disp: 36 capsule, Rfl: 3 ?  Ferrous Sulfate (IRON PO), Take by mouth., Disp: , Rfl:  ?  FIBER ADULT GUMMIES PO, Take 1 Dose by mouth daily., Disp: , Rfl:  ?  fluticasone (FLONASE) 50 MCG/ACT nasal spray, PLACE 2 SPRAYS INTO BOTH NOSTRILS DAILY AS NEEDED FOR ALLERGIES OR RHINITIS., Disp: 16 g, Rfl: 12 ?  Multiple Vitamin (MULTI-VITAMINS) TABS, Take 1 tablet by mouth daily. , Disp: , Rfl:  ?  EPINEPHrine 0.3 mg/0.3 mL IJ SOAJ injection, Inject 0.3 mLs (0.3 mg total) into the muscle once. Follow package instructions as needed for severe allergy or anaphylactic reaction. (Patient not  taking: Reported on 10/11/2021), Disp: 1 Device, Rfl: 2 ? ?Physical exam:  ?Vitals:  ? 10/11/21 1421  ?BP: 118/65  ?Pulse: 78  ?Resp: 14  ?Temp: 97.9 ?F (36.6 ?C)  ?TempSrc: Tympanic  ?SpO2: 100%  ?Weight: 204 lb (92.5 kg)  ? ?Physical Exam ?Vitals reviewed.  ?Constitutional:   ?   Appearance: He is not ill-appearing.  ?Cardiovascular:  ?   Rate and Rhythm: Normal rate and regular rhythm.  ?Pulmonary:  ?   Effort: No respiratory distress.  ?Musculoskeletal:     ?   General: No deformity.  ?Skin: ?   Coloration: Skin is not pale.  ?Neurological:  ?   Mental Status: He is alert and oriented to person, place, and time.  ?Psychiatric:     ?   Mood and Affect: Mood normal.     ?   Behavior: Behavior normal.  ?  ? ? ?  Latest Ref Rng & Units 10/11/2021  ?  2:02 PM  ?CMP  ?Glucose 70 - 99 mg/dL 95    ?BUN 6 - 20 mg/dL 14    ?Creatinine 0.61 - 1.24 mg/dL 1.21    ?Sodium 135 - 145 mmol/L 138    ?Potassium 3.5 - 5.1 mmol/L 4.2    ?Chloride 98 - 111 mmol/L 110    ?CO2 22 - 32 mmol/L 25    ?Calcium 8.9 - 10.3 mg/dL 8.5    ?Total Protein 6.5 - 8.1 g/dL  6.5    ?Total Bilirubin 0.3 - 1.2 mg/dL 0.9    ?Alkaline Phos 38 - 126 U/L 79    ?AST 15 - 41 U/L 29    ?ALT 0 - 44 U/L 32    ? ? ?  Latest Ref Rng & Units 10/11/2021  ?  2:02 PM  ?CBC  ?WBC 4.0 - 10.5 K/uL

## 2021-10-12 LAB — COPPER, SERUM: Copper: 26 ug/dL — ABNORMAL LOW (ref 69–132)

## 2022-01-20 ENCOUNTER — Other Ambulatory Visit: Payer: Self-pay

## 2022-01-20 ENCOUNTER — Encounter: Payer: Self-pay | Admitting: Oncology

## 2022-02-11 ENCOUNTER — Inpatient Hospital Stay: Payer: No Typology Code available for payment source | Attending: Oncology

## 2022-02-11 DIAGNOSIS — D509 Iron deficiency anemia, unspecified: Secondary | ICD-10-CM | POA: Diagnosis present

## 2022-02-11 DIAGNOSIS — D649 Anemia, unspecified: Secondary | ICD-10-CM

## 2022-02-11 LAB — CBC WITH DIFFERENTIAL/PLATELET
Abs Immature Granulocytes: 0.01 10*3/uL (ref 0.00–0.07)
Basophils Absolute: 0 10*3/uL (ref 0.0–0.1)
Basophils Relative: 1 %
Eosinophils Absolute: 0.1 10*3/uL (ref 0.0–0.5)
Eosinophils Relative: 1 %
HCT: 33.9 % — ABNORMAL LOW (ref 39.0–52.0)
Hemoglobin: 10.9 g/dL — ABNORMAL LOW (ref 13.0–17.0)
Immature Granulocytes: 0 %
Lymphocytes Relative: 58 %
Lymphs Abs: 2.4 10*3/uL (ref 0.7–4.0)
MCH: 28.9 pg (ref 26.0–34.0)
MCHC: 32.2 g/dL (ref 30.0–36.0)
MCV: 89.9 fL (ref 80.0–100.0)
Monocytes Absolute: 0.3 10*3/uL (ref 0.1–1.0)
Monocytes Relative: 6 %
Neutro Abs: 1.4 10*3/uL — ABNORMAL LOW (ref 1.7–7.7)
Neutrophils Relative %: 34 %
Platelets: 179 10*3/uL (ref 150–400)
RBC: 3.77 MIL/uL — ABNORMAL LOW (ref 4.22–5.81)
RDW: 13.2 % (ref 11.5–15.5)
WBC: 4.2 10*3/uL (ref 4.0–10.5)
nRBC: 0 % (ref 0.0–0.2)

## 2022-02-15 ENCOUNTER — Inpatient Hospital Stay: Payer: No Typology Code available for payment source | Admitting: Oncology

## 2022-02-17 ENCOUNTER — Inpatient Hospital Stay: Payer: No Typology Code available for payment source | Admitting: Oncology

## 2022-03-07 ENCOUNTER — Other Ambulatory Visit: Payer: Self-pay

## 2022-03-08 ENCOUNTER — Encounter: Payer: Self-pay | Admitting: Internal Medicine

## 2022-03-08 ENCOUNTER — Other Ambulatory Visit: Payer: Self-pay

## 2022-03-08 ENCOUNTER — Ambulatory Visit (INDEPENDENT_AMBULATORY_CARE_PROVIDER_SITE_OTHER): Payer: No Typology Code available for payment source | Admitting: Internal Medicine

## 2022-03-08 VITALS — BP 118/62 | HR 79 | Temp 98.1°F | Ht 71.0 in | Wt 192.4 lb

## 2022-03-08 DIAGNOSIS — J0110 Acute frontal sinusitis, unspecified: Secondary | ICD-10-CM | POA: Diagnosis not present

## 2022-03-08 DIAGNOSIS — E559 Vitamin D deficiency, unspecified: Secondary | ICD-10-CM

## 2022-03-08 DIAGNOSIS — R051 Acute cough: Secondary | ICD-10-CM

## 2022-03-08 DIAGNOSIS — R040 Epistaxis: Secondary | ICD-10-CM

## 2022-03-08 MED ORDER — AZITHROMYCIN 250 MG PO TABS
ORAL_TABLET | ORAL | 0 refills | Status: AC
  Filled 2022-03-08: qty 6, 5d supply, fill #0

## 2022-03-08 MED ORDER — D3-50 1.25 MG (50000 UT) PO CAPS
50000.0000 [IU] | ORAL_CAPSULE | ORAL | 3 refills | Status: DC
  Filled 2022-03-08 – 2022-05-24 (×2): qty 36, 84d supply, fill #0

## 2022-03-08 MED ORDER — SALINE SPRAY 0.65 % NA SOLN
2.0000 | Freq: Every day | NASAL | 2 refills | Status: AC | PRN
  Filled 2022-03-08: qty 30, 300d supply, fill #0

## 2022-03-08 MED ORDER — HYDROCODONE BIT-HOMATROP MBR 5-1.5 MG/5ML PO SOLN
5.0000 mL | Freq: Every evening | ORAL | 0 refills | Status: DC | PRN
  Filled 2022-03-08: qty 120, 24d supply, fill #0

## 2022-03-08 NOTE — Patient Instructions (Addendum)
Sent zpack to [facility] pharmacy   If needing prescription strength medication we will need to make an appointment with a provider.  These are over the counter medication options:  Mucinex dm green label for cough or robitussin DM  Multivitamin or below vitamins  Vitamin C 1000 mg daily.  Vitamin D3 4000 Iu (units) daily.  Zinc 100 mg daily.  Quercetin 250-500 mg 2 times per day   Elderberry  Oil of oregano  cepacol or chloroseptic spray Warm salt water gargles +hydrogen peroxide Sugar free cough drops  Warm tea with honey and lemon  Hydration  Try to eat though you dont feel like it   Tylenol or Advil  Nasal saline and Flonase 2 sprays nasal congestion  If sneezing/runny nose over the counter allergy pill claritin,allegra, zyrtec, xyzal Quarantine x 10-14 days 14 days preferred   Monitor pulse oximeter, buy from [facility] if oxygen is less than 90 please go to the hospital.        Are you feeling really sick? Shortness of breath, cough, chest pain?, dizziness? Confusion   If so let me know  If worsening, go to hospital or [facility] clinic Urgent care for further treatment.

## 2022-03-08 NOTE — Progress Notes (Signed)
Chief Complaint  Patient presents with   Cough    Pt had a cough and stuffy nose with blood in phlegm   Sick visit  1-2 weeks coughing sinus issues, bloody nose, cough with phelgm his mom was sick near her. 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  HENT:  Positive for nosebleeds. Negative for hearing loss.   Eyes:  Negative for blurred vision.  Respiratory:  Positive for cough and sputum production. Negative for shortness of breath.   Cardiovascular:  Negative for chest pain.  Gastrointestinal:  Negative for abdominal pain and blood in stool.  Musculoskeletal:  Negative for back pain.  Skin:  Negative for rash.  Neurological:  Negative for headaches.  Psychiatric/Behavioral:  Negative for depression.    Past Medical History:  Diagnosis Date   Anemia, unspecified 02/22/2016   iron deficient   Blood in stool    COVID-19    05/2020 xmas   Diarrhea    Essential hypertension 12/30/2016   Exocrine pancreatic insufficiency 10/15/2014   Gallstones    Gastro-esophageal reflux 12/30/2016   Inguinal hernia    Leukopenia    Sleep apnea    hx of befor weight loss surgery none now   Vasovagal syncope    Vitamin D deficiency 12/30/2016   Past Surgical History:  Procedure Laterality Date   BARIATRIC SURGERY  07/31/2013   sleeve gastrectomy with duodenal switch   CHOLECYSTECTOMY N/A 01/04/2019   Procedure: LAPAROSCOPIC CHOLECYSTECTOMY with intraoperative cholangiogram;  Surgeon: Johnathan Hausen, MD;  Location: [facility];  Service: General;  Laterality: N/A;   COLONOSCOPY WITH PROPOFOL N/A 09/03/2021   Procedure: COLONOSCOPY WITH PROPOFOL;  Surgeon: Lucilla Lame, MD;  Location: [place];  Service: Endoscopy;  Laterality: N/A;   ESOPHAGOGASTRODUODENOSCOPY (EGD) WITH PROPOFOL N/A 09/03/2021   Procedure:  ESOPHAGOGASTRODUODENOSCOPY (EGD) WITH BIOPSY;  Surgeon: Lucilla Lame, MD;  Location: [place];  Service: Endoscopy;  Laterality: N/A;   INGUINAL HERNIA REPAIR Right 01/04/2019   Procedure: OPEN RIGHT INGUINAL HERNIA REPAIR;  Surgeon: Johnathan Hausen, MD;  Location: [facility];  Service: General;  Laterality: Right;   Family History  Problem Relation Age of Onset   Diabetes Mother    Hypertension Mother    Pancreatic disease Father    Diabetes Father    Hypertension Father    Other Father        wt loss ? cause cancer had wt loss   Hypertension Brother        ?   Prostate cancer Neg Hx    Kidney cancer Neg Hx    Bladder Cancer Neg Hx    Social History   Socioeconomic History   Marital status: Married    Spouse name: Not on file   Number of children: Not on file   Years of education: Not on file   Highest education level: Not on file  Occupational History   Not on file  Tobacco Use   Smoking status: Never   Smokeless tobacco: Never  Vaping Use   Vaping Use: Never used  Substance and Sexual Activity   Alcohol use: Yes    Comment: rarely   Drug use: No   Sexual activity: Yes  Other Topics Concern   Not on file  Social History Narrative   Married    Works in  [employer] patient is a 36 year old African-American male with a past medical history significant for sleeve gastrectomy with duodenal switch back in 2015.  Other medical problems include hypertension, GERD   No guns    Wears seat belt    Safe in relationship    [employer] and [employer]   Baby of 3 kids   Social Determinants of Health   Financial Resource Strain: Not on file  Food Insecurity: Not on file  Transportation Needs: Not on file  Physical Activity: Not on file  Stress: Not on file  Social Connections: Not on file  Intimate Partner Violence: Not on file   Current Meds  Medication Sig   azithromycin (ZITHROMAX) 250 MG tablet Take 2 tablets on day 1, then 1 tablet daily on days 2 through 5 with  food   HYDROcodone bit-homatropine (HYCODAN) 5-1.5 MG/5ML syrup Take 5 mLs by mouth at bedtime as needed for cough.   sodium chloride (OCEAN) 0.65 % SOLN nasal spray Place 2 sprays into both nostrils daily as needed for congestion.   No Known Allergies Recent Results (from the past 2160 hour(s))  CBC with Differential     Status: Abnormal   Collection Time: 02/11/22  1:33 PM  Result Value Ref Range   WBC 4.2 4.0 - 10.5 K/uL   RBC 3.77 (L) 4.22 - 5.81 MIL/uL   Hemoglobin 10.9 (L) 13.0 - 17.0 g/dL   HCT 33.9 (L) 39.0 - 52.0 %   MCV 89.9 80.0 - 100.0 fL   MCH 28.9 26.0 - 34.0 pg   MCHC 32.2 30.0 - 36.0 g/dL   RDW 13.2 11.5 - 15.5 %   Platelets 179 150 - 400 K/uL   nRBC 0.0 0.0 - 0.2 %   Neutrophils Relative % 34 %   Neutro Abs 1.4 (L) 1.7 - 7.7 K/uL   Lymphocytes Relative 58 %   Lymphs Abs 2.4 0.7 - 4.0 K/uL   Monocytes Relative 6 %   Monocytes Absolute 0.3 0.1 - 1.0 K/uL   Eosinophils Relative 1 %   Eosinophils Absolute 0.1 0.0 - 0.5 K/uL   Basophils Relative 1 %   Basophils Absolute 0.0 0.0 - 0.1 K/uL   Immature Granulocytes 0 %   Abs Immature Granulocytes 0.01 0.00 - 0.07 K/uL    Comment: Performed at [facility], [address] [place], [place] [address]   Objective  Body mass index is 26.83 kg/m. Wt Readings from Last 3 Encounters:  03/08/22 192 lb 6.4 oz (87.3 kg)  10/11/21 204 lb (92.5 kg)  09/03/21 196 lb (88.9 kg)   Temp Readings from Last 3 Encounters:  03/08/22 98.1 F (36.7 C) (Oral)  10/11/21 97.9 F (36.6 C) (Tympanic)  09/03/21 98.1 F (36.7 C) (Temporal)   BP Readings from Last 3 Encounters:  03/08/22 118/62  10/11/21 118/65  09/03/21 117/73   Pulse Readings from Last 3 Encounters:  03/08/22 79  10/11/21 78  09/03/21 (!) 50    Physical Exam Vitals and nursing note reviewed.  Constitutional:      Appearance: Normal appearance. He is well-developed and well-groomed.  HENT:     Head: Normocephalic and atraumatic.  Eyes:      Conjunctiva/sclera: Conjunctivae normal.     Pupils: Pupils are equal, round, and reactive to light.  Cardiovascular:     Rate and Rhythm: Normal rate and regular rhythm.     Heart sounds: Normal heart sounds.  Pulmonary:     Effort: Pulmonary effort  is normal. No respiratory distress.     Breath sounds: Normal breath sounds.  Abdominal:     Tenderness: There is no abdominal tenderness.  Skin:    General: Skin is warm and moist.  Neurological:     General: No focal deficit present.     Mental Status: He is alert and oriented to person, place, and time. Mental status is at baseline.     Sensory: Sensation is intact.     Motor: Motor function is intact.     Coordination: Coordination is intact.     Gait: Gait is intact. Gait normal.  Psychiatric:        Attention and Perception: Attention and perception normal.        Mood and Affect: Mood and affect normal.        Speech: Speech normal.        Behavior: Behavior normal. Behavior is cooperative.        Thought Content: Thought content normal.        Cognition and Memory: Cognition and memory normal.        Judgment: Judgment normal.     Assessment  Plan  Acute cough likely URI r/o viral cause I.e covid- Plan: COVID-19, Flu A+B and RSV, azithromycin (ZITHROMAX) 250 MG tablet, HYDROcodone bit-homatropine (HYCODAN) 5-1.5 MG/5ML syrup  Acute non-recurrent frontal sinusitis - Plan: azithromycin (ZITHROMAX) 250 MG tablet  Bleeding nose - Plan: sodium chloride (OCEAN) 0.65 % SOLN nasal spray stop flonase  Vitamin D deficiency - Plan: Cholecalciferol (D3-50) 1.25 MG (50000 UT) capsule   Hm Due for CPE in the future Provider: Dr. Olivia Mackie McLean-Scocuzza-Internal Medicine

## 2022-03-10 LAB — COVID-19, FLU A+B AND RSV
Influenza A, NAA: NOT DETECTED
Influenza B, NAA: NOT DETECTED
RSV, NAA: NOT DETECTED
SARS-CoV-2, NAA: NOT DETECTED

## 2022-05-23 ENCOUNTER — Encounter: Payer: Self-pay | Admitting: Oncology

## 2022-05-24 ENCOUNTER — Encounter: Payer: Self-pay | Admitting: Oncology

## 2022-05-24 ENCOUNTER — Other Ambulatory Visit: Payer: Self-pay

## 2022-05-28 ENCOUNTER — Encounter: Payer: Self-pay | Admitting: Oncology

## 2022-06-16 ENCOUNTER — Encounter: Payer: No Typology Code available for payment source | Admitting: Family Medicine

## 2022-06-20 DIAGNOSIS — H52223 Regular astigmatism, bilateral: Secondary | ICD-10-CM | POA: Diagnosis not present

## 2022-06-20 DIAGNOSIS — H5213 Myopia, bilateral: Secondary | ICD-10-CM | POA: Diagnosis not present

## 2022-10-17 ENCOUNTER — Other Ambulatory Visit: Payer: Self-pay

## 2022-10-17 ENCOUNTER — Encounter: Payer: Self-pay | Admitting: Family Medicine

## 2022-10-17 ENCOUNTER — Ambulatory Visit (INDEPENDENT_AMBULATORY_CARE_PROVIDER_SITE_OTHER): Payer: 59 | Admitting: Family Medicine

## 2022-10-17 ENCOUNTER — Encounter: Payer: Self-pay | Admitting: Oncology

## 2022-10-17 VITALS — BP 118/62 | HR 84 | Ht 71.0 in | Wt 200.0 lb

## 2022-10-17 DIAGNOSIS — E785 Hyperlipidemia, unspecified: Secondary | ICD-10-CM | POA: Diagnosis not present

## 2022-10-17 DIAGNOSIS — D508 Other iron deficiency anemias: Secondary | ICD-10-CM | POA: Diagnosis not present

## 2022-10-17 DIAGNOSIS — J309 Allergic rhinitis, unspecified: Secondary | ICD-10-CM | POA: Diagnosis not present

## 2022-10-17 DIAGNOSIS — F39 Unspecified mood [affective] disorder: Secondary | ICD-10-CM

## 2022-10-17 DIAGNOSIS — K8681 Exocrine pancreatic insufficiency: Secondary | ICD-10-CM

## 2022-10-17 DIAGNOSIS — Z9889 Other specified postprocedural states: Secondary | ICD-10-CM

## 2022-10-17 DIAGNOSIS — E538 Deficiency of other specified B group vitamins: Secondary | ICD-10-CM | POA: Diagnosis not present

## 2022-10-17 DIAGNOSIS — N2581 Secondary hyperparathyroidism of renal origin: Secondary | ICD-10-CM | POA: Diagnosis not present

## 2022-10-17 DIAGNOSIS — R5383 Other fatigue: Secondary | ICD-10-CM | POA: Diagnosis not present

## 2022-10-17 DIAGNOSIS — E559 Vitamin D deficiency, unspecified: Secondary | ICD-10-CM

## 2022-10-17 DIAGNOSIS — E611 Iron deficiency: Secondary | ICD-10-CM

## 2022-10-17 MED ORDER — D3-50 1.25 MG (50000 UT) PO CAPS
50000.0000 [IU] | ORAL_CAPSULE | ORAL | 3 refills | Status: AC
  Filled 2022-10-17: qty 36, 84d supply, fill #0
  Filled 2023-02-03: qty 36, 84d supply, fill #1
  Filled 2023-02-03: qty 27, 63d supply, fill #1

## 2022-10-17 MED ORDER — FLUTICASONE PROPIONATE 50 MCG/ACT NA SUSP
2.0000 | Freq: Every day | NASAL | 12 refills | Status: AC | PRN
  Filled 2022-10-17: qty 16, 30d supply, fill #0

## 2022-10-17 NOTE — Patient Instructions (Signed)
It was a pleasure meeting you today. Thank you for allowing me to take part in your health care.  Our goals for today as we discussed include:  [email or web address] for therapist  Can check with employee assistance program at [facility] for therapy  Please call to schedule appointment for ADHD evaluation [facility] Attention Specialist Ginette Otto [phone]   We will get some labs. Please fast for 10 hours.  If they are abnormal or we need to do something about them, I will call you.  If they are normal, I will send you a message on MyChart (if it is active) or a letter in the mail.  If you don't hear from [place] in 2 weeks, please call the office at the number below.   Refills sent for requested medications.  If you have any questions or concerns, please do not hesitate to call the office at [phone].  I look forward to our next visit and until then take care and stay safe.  Regards,   Dana Allan, MD   [facility]

## 2022-10-17 NOTE — Progress Notes (Addendum)
SUBJECTIVE:   Chief Complaint  Patient presents with   Establish Care   HPI Patient presents to clinic to transfer care.  Requesting ADHD testing.  Reports having increased difficulty focusing on tasks.  Has had no formal ADHD testing in the past.  Not previously on medications in the past.  Denies any SI/HI.  Endorses increased anxiety/frustration at times.  Reports history of anaphylaxis in 2020. Has EpiPen that was prescribed by previous PCP.  However has never had to use this.  Per chart review had allergic reaction possible tilapia in 2017 where he went to the ED and was given Benadryl, epinephrine and prednisone.  Did not have any angioedema, shortness of breath, wheezing or chest pain at that time.  Iron deficiency anemia Takes iron supplements 325 mg daily.  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  Has had previous IV iron injections in the past.  PERTINENT PMH / PSH: History of gastric bypass Seasonal allergies History of iron deficiency anemia History copper deficiency   OBJECTIVE:  BP 118/62   Pulse 84   Ht 5\' 11"  (1.803 m)   Wt 200 lb (90.7 kg)   SpO2 98%   BMI 27.89 kg/m    Physical Exam Vitals reviewed.  Constitutional:      General: He is not in acute distress.    Appearance: He is normal weight. He is not ill-appearing.  HENT:     Head: Normocephalic.  Eyes:     Conjunctiva/sclera: Conjunctivae normal.  Cardiovascular:     Rate and Rhythm: Normal rate and regular rhythm.     Pulses: Normal pulses.  Pulmonary:     Effort: Pulmonary effort is normal.     Breath sounds: Normal breath sounds.  Abdominal:     General: Bowel sounds are normal.  Neurological:     Mental Status: He is alert. Mental status is at baseline.  Psychiatric:        Mood and Affect: Mood normal.        Behavior: Behavior normal.        Thought Content: Thought content normal.        Judgment: Judgment normal.       10/17/2022    1:03 PM 03/08/2022   10:42 AM 04/30/2021     3:26 PM 01/08/2020    3:21 PM  Depression screen PHQ 2/9  Decreased Interest 2 0 2 0  Down, Depressed, Hopeless 2 0 2 2  PHQ - 2 Score 4 0 4 2  Altered sleeping 2   0  Tired, decreased energy 2   2  Change in appetite 1     Feeling bad or failure about yourself  2   0  Trouble concentrating 2   2  Moving slowly or fidgety/restless 0   0  Suicidal thoughts 0   0  PHQ-9 Score 13   6  Difficult doing work/chores Somewhat difficult   Somewhat difficult        10/17/2022    1:03 PM 01/08/2020    3:22 PM  GAD 7 : Generalized Anxiety Score  Nervous, Anxious, on Edge 1 2  Control/stop worrying 0 2  Worry too much - different things 2 2  Trouble relaxing 1 0  Restless 0 0  Easily annoyed or irritable 2 2  Afraid - awful might happen 0 2  Total GAD 7 Score 6 10  Anxiety Difficulty Somewhat difficult Somewhat difficult     ASSESSMENT/PLAN:  Exocrine pancreatic insufficiency -  Hemoglobin A1c; Future  Vitamin D deficiency Assessment & Plan: History of gastric bypass Check vitamin D  Orders: -     D3-50; Take 1 capsule (50,000 Units total) by mouth 3 (three) times a week.  Dispense: 36 capsule; Refill: 3 -     VITAMIN D 25 Hydroxy (Vit-D Deficiency, Fractures); Future  Allergic rhinitis, unspecified seasonality, unspecified trigger -     Fluticasone Propionate; Place 2 sprays into both nostrils daily as needed for allergies or rhinitis.  Dispense: 16 g; Refill: 12  Iron deficiency -     CBC with Differential/Platelet; Future -     Iron, TIBC and Ferritin Panel; Future  Vitamin B 12 deficiency -     Vitamin B12; Future  Other fatigue Assessment & Plan: Chronic.  Considered IDA, poor nutrition, decreased sleep, mood disorder, electrolyte imbalance Will check TSH, A1c, CBC, ferritin,  Orders: -     CBC with Differential/Platelet; Future -     Comprehensive metabolic panel; Future -     TSH; Future -     Vitamin B12; Future -     VITAMIN D 25 Hydroxy (Vit-D  Deficiency, Fractures); Future -     Iron, TIBC and Ferritin Panel; Future -     HIV Antibody (routine testing w rflx); Future -     Hepatitis B surface antibody,qualitative; Future  Hyperlipidemia, unspecified hyperlipidemia type -     Lipid panel; Future  S/P gastric surgery Assessment & Plan: Currently on multivitamins and iron supplements Check vitamin D, vitamin B12, CBC, CMET today    Other iron deficiency anemia Assessment & Plan: Chronic.  Currently on supplementation. Continue ferrous sulfate 325 mg daily Check ferritin   Secondary hyperparathyroidism (HCC) Assessment & Plan: Chronic.  Takes vitamin D3 50,000 units times a week History of gastric bypass Check vitamin D levels Refill vitamin D 1.25 mg 3 times a week   Mood disorder (HCC) Assessment & Plan: Requesting ADHD testing for decreased concentration and focus.  Reviewed other etiologies including poor appetite, decreased sleep and decreased hydration all of which patient meets those criteria.  Recommend 3 meals daily, 8 hours of sleep and 64 ounces of water daily. Provided information for ADHD testing. Consider psychiatry evaluation at patient's request. Follow-up as needed   Previous hepatitis B antibody low indicating no immunity.  Recommend hepatitis B vaccine if not completed.    PDMP reviewed  Return in about 3 months (around 01/17/2023) for PCP.  Dana Allan, MD

## 2022-10-30 ENCOUNTER — Encounter: Payer: Self-pay | Admitting: Family Medicine

## 2022-10-30 DIAGNOSIS — E538 Deficiency of other specified B group vitamins: Secondary | ICD-10-CM | POA: Insufficient documentation

## 2022-10-30 DIAGNOSIS — R5383 Other fatigue: Secondary | ICD-10-CM | POA: Insufficient documentation

## 2022-10-30 DIAGNOSIS — F39 Unspecified mood [affective] disorder: Secondary | ICD-10-CM | POA: Insufficient documentation

## 2022-10-30 DIAGNOSIS — E785 Hyperlipidemia, unspecified: Secondary | ICD-10-CM | POA: Insufficient documentation

## 2022-10-30 NOTE — Assessment & Plan Note (Signed)
Chronic.  Currently on supplementation. Continue ferrous sulfate 325 mg daily Check ferritin

## 2022-10-30 NOTE — Assessment & Plan Note (Signed)
Chronic.  Considered IDA, poor nutrition, decreased sleep, mood disorder, electrolyte imbalance Will check TSH, A1c, CBC, ferritin,

## 2022-10-30 NOTE — Assessment & Plan Note (Addendum)
Currently on multivitamins and iron supplements Check vitamin D, vitamin B12, CBC, CMET today

## 2022-10-30 NOTE — Assessment & Plan Note (Signed)
Requesting ADHD testing for decreased concentration and focus.  Reviewed other etiologies including poor appetite, decreased sleep and decreased hydration all of which patient meets those criteria.  Recommend 3 meals daily, 8 hours of sleep and 64 ounces of water daily. Provided information for ADHD testing. Consider psychiatry evaluation at patient's request. Follow-up as needed

## 2022-10-30 NOTE — Assessment & Plan Note (Signed)
History of gastric bypass Check vitamin D

## 2022-10-30 NOTE — Assessment & Plan Note (Addendum)
Chronic.  Takes vitamin D3 50,000 units times a week History of gastric bypass Check vitamin D levels Refill vitamin D 1.25 mg 3 times a week

## 2023-01-11 ENCOUNTER — Encounter (INDEPENDENT_AMBULATORY_CARE_PROVIDER_SITE_OTHER): Payer: Self-pay

## 2023-01-17 ENCOUNTER — Ambulatory Visit: Payer: 59 | Admitting: Family Medicine

## 2023-01-24 ENCOUNTER — Ambulatory Visit: Payer: 59 | Admitting: Family Medicine

## 2023-01-30 ENCOUNTER — Ambulatory Visit: Payer: 59 | Admitting: Family Medicine

## 2023-02-03 ENCOUNTER — Other Ambulatory Visit: Payer: Self-pay

## 2023-02-03 ENCOUNTER — Other Ambulatory Visit (HOSPITAL_COMMUNITY): Payer: Self-pay

## 2023-02-03 ENCOUNTER — Encounter: Payer: Self-pay | Admitting: Oncology

## 2023-02-20 ENCOUNTER — Ambulatory Visit: Payer: 59 | Admitting: Family Medicine

## 2023-02-23 ENCOUNTER — Ambulatory Visit: Payer: 59 | Admitting: Family Medicine

## 2023-03-08 ENCOUNTER — Encounter: Payer: Self-pay | Admitting: Family Medicine

## 2023-03-08 ENCOUNTER — Ambulatory Visit (INDEPENDENT_AMBULATORY_CARE_PROVIDER_SITE_OTHER): Payer: 59 | Admitting: Family Medicine

## 2023-03-08 VITALS — BP 98/70 | HR 71 | Temp 98.0°F | Resp 16 | Ht 71.0 in | Wt 204.4 lb

## 2023-03-08 DIAGNOSIS — E559 Vitamin D deficiency, unspecified: Secondary | ICD-10-CM

## 2023-03-08 DIAGNOSIS — E611 Iron deficiency: Secondary | ICD-10-CM | POA: Diagnosis not present

## 2023-03-08 DIAGNOSIS — Z789 Other specified health status: Secondary | ICD-10-CM

## 2023-03-08 DIAGNOSIS — E785 Hyperlipidemia, unspecified: Secondary | ICD-10-CM | POA: Diagnosis not present

## 2023-03-08 DIAGNOSIS — F39 Unspecified mood [affective] disorder: Secondary | ICD-10-CM

## 2023-03-08 DIAGNOSIS — E538 Deficiency of other specified B group vitamins: Secondary | ICD-10-CM

## 2023-03-08 DIAGNOSIS — Z114 Encounter for screening for human immunodeficiency virus [HIV]: Secondary | ICD-10-CM

## 2023-03-08 DIAGNOSIS — K8681 Exocrine pancreatic insufficiency: Secondary | ICD-10-CM

## 2023-03-08 DIAGNOSIS — E663 Overweight: Secondary | ICD-10-CM

## 2023-03-08 DIAGNOSIS — Z0000 Encounter for general adult medical examination without abnormal findings: Secondary | ICD-10-CM

## 2023-03-08 LAB — COMPREHENSIVE METABOLIC PANEL
ALT: 38 U/L (ref 0–53)
AST: 39 U/L — ABNORMAL HIGH (ref 0–37)
Albumin: 4.2 g/dL (ref 3.5–5.2)
Alkaline Phosphatase: 95 U/L (ref 39–117)
BUN: 16 mg/dL (ref 6–23)
CO2: 27 mEq/L (ref 19–32)
Calcium: 9.4 mg/dL (ref 8.4–10.5)
Chloride: 106 mEq/L (ref 96–112)
Creatinine, Ser: 1.11 mg/dL (ref 0.40–1.50)
GFR: 85.88 mL/min (ref >60.00)
Glucose, Bld: 85 mg/dL (ref 70–99)
Potassium: 4.6 mEq/L (ref 3.5–5.1)
Sodium: 139 mEq/L (ref 135–145)
Total Bilirubin: 1.1 mg/dL (ref 0.2–1.2)
Total Protein: 6.9 g/dL (ref 6.0–8.3)

## 2023-03-08 LAB — VITAMIN B12: Vitamin B-12: 1338 pg/mL — ABNORMAL HIGH (ref 211–911)

## 2023-03-08 LAB — LIPID PANEL
Cholesterol: 114 mg/dL (ref 0–200)
HDL: 56.9 mg/dL (ref >39.00)
LDL Cholesterol: 41 mg/dL (ref 0–99)
NonHDL: 57.36
Total CHOL/HDL Ratio: 2
Triglycerides: 82 mg/dL (ref 0.0–149.0)
VLDL: 16.4 mg/dL (ref 0.0–40.0)

## 2023-03-08 LAB — CBC WITH DIFFERENTIAL/PLATELET
Basophils Absolute: 0 10*3/uL (ref 0.0–0.1)
Basophils Relative: 0.8 % (ref 0.0–3.0)
Eosinophils Absolute: 0 10*3/uL (ref 0.0–0.7)
Eosinophils Relative: 1.4 % (ref 0.0–5.0)
HCT: 41.5 % (ref 39.0–52.0)
Hemoglobin: 13.1 g/dL (ref 13.0–17.0)
Lymphocytes Relative: 52.9 % — ABNORMAL HIGH (ref 12.0–46.0)
Lymphs Abs: 1.8 10*3/uL (ref 0.7–4.0)
MCHC: 31.5 g/dL (ref 30.0–36.0)
MCV: 89.2 fl (ref 78.0–100.0)
Monocytes Absolute: 0.2 10*3/uL (ref 0.1–1.0)
Monocytes Relative: 5.8 % (ref 3.0–12.0)
Neutro Abs: 1.3 10*3/uL — ABNORMAL LOW (ref 1.4–7.7)
Neutrophils Relative %: 39.1 % — ABNORMAL LOW (ref 43.0–77.0)
Platelets: 183 10*3/uL (ref 150.0–400.0)
RBC: 4.65 Mil/uL (ref 4.22–5.81)
RDW: 13.6 % (ref 11.5–15.5)
WBC: 3.4 10*3/uL — ABNORMAL LOW (ref 4.0–10.5)

## 2023-03-08 LAB — HEMOGLOBIN A1C: Hgb A1c MFr Bld: 4.5 % — ABNORMAL LOW (ref 4.6–6.5)

## 2023-03-08 LAB — TSH: TSH: 1.58 u[IU]/mL (ref 0.35–5.50)

## 2023-03-08 LAB — VITAMIN D 25 HYDROXY (VIT D DEFICIENCY, FRACTURES): VITD: 26.19 ng/mL — ABNORMAL LOW (ref 30.00–100.00)

## 2023-03-08 NOTE — Patient Instructions (Addendum)
It was a pleasure meeting you today. Thank you for allowing me to take part in your health care.  Our goals for today as we discussed include:  We will get some labs today.  If they are abnormal or we need to do something about them, I will call you.  If they are normal, I will send you a message on MyChart (if it is active) or a letter in the mail.  If you don't hear from [place] in 2 weeks, please call the office at the number below.   Recommend Flu and Covid vaccine.  Follow up as needed  This is a list of the screening recommended for you and due dates:  Health Maintenance  Topic Date Due   COVID-19 Vaccine (2 - Pfizer risk series) 01/28/2020   Flu Shot  01/12/2023   DTaP/Tdap/Td vaccine (2 - Td or Tdap) 07/23/2030   Hepatitis C Screening  Completed   HIV Screening  Completed   HPV Vaccine  Aged Out     If you have any questions or concerns, please do not hesitate to call the office at [phone].  I look forward to our next visit and until then take care and stay safe.  Regards,   Dana Allan, MD   [facility]

## 2023-03-08 NOTE — Progress Notes (Signed)
SUBJECTIVE:   Chief Complaint  Patient presents with   Annual Exam   HPI Patient presents to clinic for physical exam  No acute concerns today  Has not had annual labs completed prior to visit  Recently started new position in [place] with increased responsibility. Little stressful but managing. Denies any SI/HI   PERTINENT PMH / PSH: History of gastric bypass Seasonal allergies History of iron deficiency anemia History copper deficiency   OBJECTIVE:  BP 98/70   Pulse 71   Temp 98 F (36.7 C)   Resp 16   Ht 5\' 11"  (1.803 m)   Wt 204 lb 6 oz (92.7 kg)   SpO2 98%   BMI 28.50 kg/m    Physical Exam Vitals reviewed.  HENT:     Head: Normocephalic.     Right Ear: Tympanic membrane, ear canal and external ear normal.     Left Ear: Tympanic membrane, ear canal and external ear normal.     Nose: Nose normal.     Mouth/Throat:     Mouth: Mucous membranes are moist.  Eyes:     Conjunctiva/sclera: Conjunctivae normal.     Pupils: Pupils are equal, round, and reactive to light.  Neck:     Thyroid: No thyroid tenderness.     Vascular: No carotid bruit.  Cardiovascular:     Rate and Rhythm: Normal rate and regular rhythm.     Pulses: Normal pulses.     Heart sounds: Normal heart sounds.  Pulmonary:     Effort: Pulmonary effort is normal.     Breath sounds: Normal breath sounds.  Abdominal:     General: Abdomen is flat. Bowel sounds are normal.     Palpations: Abdomen is soft.  Musculoskeletal:        General: Normal range of motion.     Cervical back: Normal range of motion and neck supple.     Right lower leg: No edema.     Left lower leg: No edema.  Lymphadenopathy:     Cervical: No cervical adenopathy.  Neurological:     Mental Status: He is alert.  Psychiatric:        Mood and Affect: Mood normal.        Behavior: Behavior normal.        Thought Content: Thought content normal.        Judgment: Judgment normal.       03/08/2023    7:52 AM 10/17/2022     1:03 PM 03/08/2022   10:42 AM 04/30/2021    3:26 PM 01/08/2020    3:21 PM  Depression screen PHQ 2/9  Decreased Interest 0 2 0 2 0  Down, Depressed, Hopeless 0 2 0 2 2  PHQ - 2 Score 0 4 0 4 2  Altered sleeping 0 2   0  Tired, decreased energy 2 2   2   Change in appetite 0 1     Feeling bad or failure about yourself  1 2   0  Trouble concentrating 2 2   2   Moving slowly or fidgety/restless 0 0   0  Suicidal thoughts 0 0   0  PHQ-9 Score 5 13   6   Difficult doing work/chores Very difficult Somewhat difficult   Somewhat difficult        03/08/2023    7:52 AM 10/17/2022    1:03 PM 01/08/2020    3:22 PM  GAD 7 : Generalized Anxiety Score  Nervous, Anxious, on Edge  0 1 2  Control/stop worrying 2 0 2  Worry too much - different things 2 2 2   Trouble relaxing 2 1 0  Restless 0 0 0  Easily annoyed or irritable 1 2 2   Afraid - awful might happen 1 0 2  Total GAD 7 Score 8 6 10   Anxiety Difficulty Very difficult Somewhat difficult Somewhat difficult     ASSESSMENT/PLAN:  Annual physical exam Assessment & Plan: Annual labs today Tetanus up to date Hep C/HIV screening completed Flu vaccine declined Colonoscopy at age 17 PSA at 42 yrs Normotensive PHQ9/GAD screening   Iron deficiency -     Iron, TIBC and Ferritin Panel -     CBC with Differential/Platelet -     CBC with Differential/Platelet -     Iron, TIBC and Ferritin Panel  Vitamin D deficiency -     VITAMIN D 25 Hydroxy (Vit-D Deficiency, Fractures)  Vitamin B 12 deficiency -     Vitamin B12  Hyperlipidemia, unspecified hyperlipidemia type Assessment & Plan: Check fasting lipids  Orders: -     Lipid panel  Exocrine pancreatic insufficiency -     Comprehensive metabolic panel -     Hemoglobin A1c  Encounter for screening for HIV -     HIV Antibody (routine testing w rflx) -     HIV Antibody (routine testing w rflx)  Overweight (BMI 25.0-29.9) -     TSH  Nonimmune to hepatitis B virus Assessment &  Plan: Previous hepatitis B antibody low indicating no immunity. . Has completed 3 dose series Hep B vaccine Check titers to ensure immunity  Orders: -     Hepatitis B surface antibody,qualitative  Mood disorder [facility]) Assessment & Plan: Denies SI/HI Increased anxiety with new job but reports managing Encourage CBT  Follow up as needed       PDMP reviewed  Return if symptoms worsen or fail to improve, for PCP.  Dana Allan, MD

## 2023-03-09 ENCOUNTER — Encounter: Payer: Self-pay | Admitting: Family Medicine

## 2023-03-09 LAB — HIV ANTIBODY (ROUTINE TESTING W REFLEX): HIV 1&2 Ab, 4th Generation: NONREACTIVE

## 2023-03-09 LAB — IRON,TIBC AND FERRITIN PANEL
%SAT: 33 % (calc) (ref 20–48)
Ferritin: 17 ng/mL — ABNORMAL LOW (ref 38–380)
Iron: 145 ug/dL (ref 50–180)
TIBC: 435 mcg/dL (calc) — ABNORMAL HIGH (ref 250–425)

## 2023-03-09 LAB — HEPATITIS B SURFACE ANTIBODY,QUALITATIVE: Hep B S Ab: NONREACTIVE

## 2023-03-15 ENCOUNTER — Encounter: Payer: Self-pay | Admitting: Family Medicine

## 2023-03-15 DIAGNOSIS — E611 Iron deficiency: Secondary | ICD-10-CM | POA: Insufficient documentation

## 2023-03-15 DIAGNOSIS — Z114 Encounter for screening for human immunodeficiency virus [HIV]: Secondary | ICD-10-CM | POA: Insufficient documentation

## 2023-03-15 DIAGNOSIS — Z789 Other specified health status: Secondary | ICD-10-CM | POA: Insufficient documentation

## 2023-03-15 NOTE — Assessment & Plan Note (Signed)
Previous hepatitis B antibody low indicating no immunity. . Has completed 3 dose series Hep B vaccine Check titers to ensure immunity

## 2023-03-15 NOTE — Assessment & Plan Note (Signed)
Denies SI/HI Increased anxiety with new job but reports managing Encourage CBT  Follow up as needed

## 2023-03-15 NOTE — Assessment & Plan Note (Signed)
Annual labs today Tetanus up to date Hep C/HIV screening completed Flu vaccine declined Colonoscopy at age 37 PSA at 34 yrs Normotensive PHQ9/GAD screening

## 2023-03-15 NOTE — Assessment & Plan Note (Signed)
Check fasting lipids
